# Patient Record
Sex: Female | Born: 1970 | Race: White | Hispanic: No | Marital: Single | Smoking: Current every day smoker
Health system: Southern US, Community
[De-identification: ages and names within clinical notes are randomized; demographics above are authoritative.]

## PROBLEM LIST (undated history)

## (undated) ENCOUNTER — Ambulatory Visit: Admission: EM | Source: Home / Self Care

## (undated) DIAGNOSIS — D219 Benign neoplasm of connective and other soft tissue, unspecified: Secondary | ICD-10-CM

## (undated) DIAGNOSIS — F419 Anxiety disorder, unspecified: Secondary | ICD-10-CM

## (undated) DIAGNOSIS — F329 Major depressive disorder, single episode, unspecified: Secondary | ICD-10-CM

## (undated) DIAGNOSIS — F1910 Other psychoactive substance abuse, uncomplicated: Secondary | ICD-10-CM

## (undated) DIAGNOSIS — L723 Sebaceous cyst: Secondary | ICD-10-CM

## (undated) DIAGNOSIS — F32A Depression, unspecified: Secondary | ICD-10-CM

## (undated) DIAGNOSIS — R32 Unspecified urinary incontinence: Secondary | ICD-10-CM

## (undated) DIAGNOSIS — F4310 Post-traumatic stress disorder, unspecified: Secondary | ICD-10-CM

## (undated) DIAGNOSIS — B1920 Unspecified viral hepatitis C without hepatic coma: Secondary | ICD-10-CM

## (undated) DIAGNOSIS — F1010 Alcohol abuse, uncomplicated: Secondary | ICD-10-CM

## (undated) DIAGNOSIS — T50992A Poisoning by other drugs, medicaments and biological substances, intentional self-harm, initial encounter: Secondary | ICD-10-CM

## (undated) HISTORY — DX: Unspecified urinary incontinence: R32

## (undated) HISTORY — PX: BACK SURGERY: SHX140

## (undated) HISTORY — DX: Benign neoplasm of connective and other soft tissue, unspecified: D21.9

## (undated) HISTORY — DX: Anxiety disorder, unspecified: F41.9

## (undated) HISTORY — DX: Sebaceous cyst: L72.3

## (undated) HISTORY — PX: APPENDECTOMY: SHX54

## (undated) HISTORY — PX: KNEE SURGERY: SHX244

## (undated) HISTORY — DX: Post-traumatic stress disorder, unspecified: F43.10

## (undated) HISTORY — DX: Poisoning by other drugs, medicaments and biological substances, intentional self-harm, initial encounter: T50.992A

---

## 2015-03-22 DIAGNOSIS — K529 Noninfective gastroenteritis and colitis, unspecified: Secondary | ICD-10-CM | POA: Diagnosis not present

## 2015-04-02 DIAGNOSIS — R05 Cough: Secondary | ICD-10-CM | POA: Diagnosis not present

## 2015-04-02 DIAGNOSIS — F419 Anxiety disorder, unspecified: Secondary | ICD-10-CM | POA: Diagnosis not present

## 2015-04-02 DIAGNOSIS — G894 Chronic pain syndrome: Secondary | ICD-10-CM | POA: Diagnosis not present

## 2015-04-02 DIAGNOSIS — R0602 Shortness of breath: Secondary | ICD-10-CM | POA: Diagnosis not present

## 2015-04-02 DIAGNOSIS — G43009 Migraine without aura, not intractable, without status migrainosus: Secondary | ICD-10-CM | POA: Diagnosis not present

## 2015-04-02 DIAGNOSIS — J45901 Unspecified asthma with (acute) exacerbation: Secondary | ICD-10-CM | POA: Diagnosis not present

## 2015-04-02 DIAGNOSIS — G4700 Insomnia, unspecified: Secondary | ICD-10-CM | POA: Diagnosis not present

## 2015-04-02 DIAGNOSIS — E669 Obesity, unspecified: Secondary | ICD-10-CM | POA: Diagnosis not present

## 2015-04-02 DIAGNOSIS — F17290 Nicotine dependence, other tobacco product, uncomplicated: Secondary | ICD-10-CM | POA: Diagnosis not present

## 2015-04-04 DIAGNOSIS — M5417 Radiculopathy, lumbosacral region: Secondary | ICD-10-CM | POA: Diagnosis not present

## 2015-04-04 DIAGNOSIS — M961 Postlaminectomy syndrome, not elsewhere classified: Secondary | ICD-10-CM | POA: Diagnosis not present

## 2015-04-26 DIAGNOSIS — M5417 Radiculopathy, lumbosacral region: Secondary | ICD-10-CM | POA: Diagnosis not present

## 2015-04-26 DIAGNOSIS — M961 Postlaminectomy syndrome, not elsewhere classified: Secondary | ICD-10-CM | POA: Diagnosis not present

## 2015-04-26 DIAGNOSIS — M545 Low back pain: Secondary | ICD-10-CM | POA: Diagnosis not present

## 2015-05-05 DIAGNOSIS — M47816 Spondylosis without myelopathy or radiculopathy, lumbar region: Secondary | ICD-10-CM | POA: Diagnosis not present

## 2015-05-05 DIAGNOSIS — M5416 Radiculopathy, lumbar region: Secondary | ICD-10-CM | POA: Diagnosis not present

## 2015-05-24 DIAGNOSIS — E669 Obesity, unspecified: Secondary | ICD-10-CM | POA: Diagnosis not present

## 2015-05-24 DIAGNOSIS — F419 Anxiety disorder, unspecified: Secondary | ICD-10-CM | POA: Diagnosis not present

## 2015-05-24 DIAGNOSIS — G894 Chronic pain syndrome: Secondary | ICD-10-CM | POA: Diagnosis not present

## 2015-05-24 DIAGNOSIS — G43009 Migraine without aura, not intractable, without status migrainosus: Secondary | ICD-10-CM | POA: Diagnosis not present

## 2015-05-24 DIAGNOSIS — G4700 Insomnia, unspecified: Secondary | ICD-10-CM | POA: Diagnosis not present

## 2015-06-16 DIAGNOSIS — E669 Obesity, unspecified: Secondary | ICD-10-CM | POA: Diagnosis not present

## 2015-06-16 DIAGNOSIS — G894 Chronic pain syndrome: Secondary | ICD-10-CM | POA: Diagnosis not present

## 2015-06-16 DIAGNOSIS — G450 Vertebro-basilar artery syndrome: Secondary | ICD-10-CM | POA: Diagnosis not present

## 2015-06-16 DIAGNOSIS — F419 Anxiety disorder, unspecified: Secondary | ICD-10-CM | POA: Diagnosis not present

## 2015-06-16 DIAGNOSIS — G4700 Insomnia, unspecified: Secondary | ICD-10-CM | POA: Diagnosis not present

## 2015-06-17 DIAGNOSIS — E118 Type 2 diabetes mellitus with unspecified complications: Secondary | ICD-10-CM | POA: Diagnosis not present

## 2015-06-17 DIAGNOSIS — K319 Disease of stomach and duodenum, unspecified: Secondary | ICD-10-CM | POA: Diagnosis not present

## 2015-06-17 DIAGNOSIS — D649 Anemia, unspecified: Secondary | ICD-10-CM | POA: Diagnosis not present

## 2015-06-17 DIAGNOSIS — R6889 Other general symptoms and signs: Secondary | ICD-10-CM | POA: Diagnosis not present

## 2015-07-21 DIAGNOSIS — S8390XA Sprain of unspecified site of unspecified knee, initial encounter: Secondary | ICD-10-CM | POA: Diagnosis not present

## 2015-07-21 DIAGNOSIS — M25561 Pain in right knee: Secondary | ICD-10-CM | POA: Diagnosis not present

## 2015-10-26 DIAGNOSIS — G894 Chronic pain syndrome: Secondary | ICD-10-CM | POA: Diagnosis not present

## 2015-10-26 DIAGNOSIS — M791 Myalgia: Secondary | ICD-10-CM | POA: Diagnosis not present

## 2015-10-26 DIAGNOSIS — J069 Acute upper respiratory infection, unspecified: Secondary | ICD-10-CM | POA: Diagnosis not present

## 2015-10-26 DIAGNOSIS — M545 Low back pain: Secondary | ICD-10-CM | POA: Diagnosis not present

## 2015-10-31 DIAGNOSIS — M5416 Radiculopathy, lumbar region: Secondary | ICD-10-CM | POA: Diagnosis not present

## 2015-10-31 DIAGNOSIS — M47816 Spondylosis without myelopathy or radiculopathy, lumbar region: Secondary | ICD-10-CM | POA: Diagnosis not present

## 2016-01-08 DIAGNOSIS — F419 Anxiety disorder, unspecified: Secondary | ICD-10-CM | POA: Diagnosis not present

## 2016-01-08 DIAGNOSIS — Z3200 Encounter for pregnancy test, result unknown: Secondary | ICD-10-CM | POA: Diagnosis not present

## 2016-01-11 DIAGNOSIS — R109 Unspecified abdominal pain: Secondary | ICD-10-CM | POA: Diagnosis not present

## 2016-01-11 DIAGNOSIS — N926 Irregular menstruation, unspecified: Secondary | ICD-10-CM | POA: Diagnosis not present

## 2016-01-11 DIAGNOSIS — Z01419 Encounter for gynecological examination (general) (routine) without abnormal findings: Secondary | ICD-10-CM | POA: Diagnosis not present

## 2016-01-11 DIAGNOSIS — N949 Unspecified condition associated with female genital organs and menstrual cycle: Secondary | ICD-10-CM | POA: Diagnosis not present

## 2016-01-20 DIAGNOSIS — T888XXA Other specified complications of surgical and medical care, not elsewhere classified, initial encounter: Secondary | ICD-10-CM | POA: Diagnosis not present

## 2016-01-29 DIAGNOSIS — R509 Fever, unspecified: Secondary | ICD-10-CM | POA: Diagnosis not present

## 2016-01-29 DIAGNOSIS — J069 Acute upper respiratory infection, unspecified: Secondary | ICD-10-CM | POA: Diagnosis not present

## 2016-02-14 DIAGNOSIS — M961 Postlaminectomy syndrome, not elsewhere classified: Secondary | ICD-10-CM | POA: Diagnosis not present

## 2016-02-14 DIAGNOSIS — M5417 Radiculopathy, lumbosacral region: Secondary | ICD-10-CM | POA: Diagnosis not present

## 2016-03-31 DIAGNOSIS — Z139 Encounter for screening, unspecified: Secondary | ICD-10-CM | POA: Diagnosis not present

## 2016-04-05 DIAGNOSIS — R0989 Other specified symptoms and signs involving the circulatory and respiratory systems: Secondary | ICD-10-CM | POA: Diagnosis not present

## 2016-04-05 DIAGNOSIS — M25531 Pain in right wrist: Secondary | ICD-10-CM | POA: Diagnosis not present

## 2016-04-05 DIAGNOSIS — M79641 Pain in right hand: Secondary | ICD-10-CM | POA: Diagnosis not present

## 2016-04-06 DIAGNOSIS — R0989 Other specified symptoms and signs involving the circulatory and respiratory systems: Secondary | ICD-10-CM | POA: Diagnosis not present

## 2016-04-06 DIAGNOSIS — M79641 Pain in right hand: Secondary | ICD-10-CM | POA: Diagnosis not present

## 2016-04-06 DIAGNOSIS — M25531 Pain in right wrist: Secondary | ICD-10-CM | POA: Diagnosis not present

## 2016-04-23 ENCOUNTER — Emergency Department (HOSPITAL_COMMUNITY)
Admission: EM | Admit: 2016-04-23 | Discharge: 2016-04-23 | Disposition: A | Discharge status: Home / Self Care | Payer: Self-pay | Attending: Emergency Medicine | Admitting: Emergency Medicine

## 2016-04-23 ENCOUNTER — Encounter (HOSPITAL_COMMUNITY): Payer: Self-pay | Admitting: Emergency Medicine

## 2016-04-23 DIAGNOSIS — F1110 Opioid abuse, uncomplicated: Secondary | ICD-10-CM | POA: Insufficient documentation

## 2016-04-23 DIAGNOSIS — Z5321 Procedure and treatment not carried out due to patient leaving prior to being seen by health care provider: Secondary | ICD-10-CM | POA: Insufficient documentation

## 2016-04-23 HISTORY — DX: Other psychoactive substance abuse, uncomplicated: F19.10

## 2016-04-23 NOTE — ED Notes (Signed)
No answer from pt when called for room x 1.  

## 2016-04-23 NOTE — ED Triage Notes (Signed)
Per EMS: Pt moved here from [place] two days ago.  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  Pt last used heroin 2 days ago.  Called different facilities this morning but they could not accept her because of her insurance.  Pt has had abd pain, NVD x 2 days.

## 2016-04-23 NOTE — ED Notes (Signed)
Pt called x3 with no response  

## 2016-04-23 NOTE — ED Notes (Signed)
2nd call for pt with no response. 

## 2016-04-24 NOTE — ED Provider Notes (Signed)
I did not see or evaluate the patient.  The patient left the emergency department after triage and before being seen by medical provider   Jola Schmidt, MD 04/24/16 [phone]

## 2016-05-01 DIAGNOSIS — M5136 Other intervertebral disc degeneration, lumbar region: Secondary | ICD-10-CM | POA: Diagnosis not present

## 2016-06-11 ENCOUNTER — Encounter (HOSPITAL_COMMUNITY): Payer: Self-pay | Admitting: Emergency Medicine

## 2016-06-11 ENCOUNTER — Emergency Department (HOSPITAL_COMMUNITY)
Admission: EM | Admit: 2016-06-11 | Discharge: 2016-06-11 | Disposition: A | Discharge status: Home / Self Care | Payer: Self-pay | Attending: Dermatology | Admitting: Dermatology

## 2016-06-11 DIAGNOSIS — F10120 Alcohol abuse with intoxication, uncomplicated: Secondary | ICD-10-CM | POA: Insufficient documentation

## 2016-06-11 DIAGNOSIS — Z5321 Procedure and treatment not carried out due to patient leaving prior to being seen by health care provider: Secondary | ICD-10-CM | POA: Insufficient documentation

## 2016-06-11 DIAGNOSIS — R000 Tachycardia, unspecified: Secondary | ICD-10-CM | POA: Diagnosis not present

## 2016-06-11 NOTE — ED Notes (Signed)
Pt not in room.
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
## 2016-06-21 ENCOUNTER — Ambulatory Visit (HOSPITAL_COMMUNITY)
Admission: RE | Admit: 2016-06-21 | Discharge: 2016-06-21 | Disposition: A | Discharge status: Home / Self Care | Payer: Medicare Other | Attending: Psychiatry | Admitting: Psychiatry

## 2016-06-21 DIAGNOSIS — F1120 Opioid dependence, uncomplicated: Secondary | ICD-10-CM | POA: Insufficient documentation

## 2016-06-21 DIAGNOSIS — Z720 Tobacco use: Secondary | ICD-10-CM | POA: Insufficient documentation

## 2016-06-21 DIAGNOSIS — F1020 Alcohol dependence, uncomplicated: Secondary | ICD-10-CM | POA: Diagnosis present
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
## 2016-06-21 NOTE — H&P (Signed)
Behavioral Health Medical Screening Exam  Laura [name] is an 46 y.o. female.  Total Time spent with patient: 20 minutes  Psychiatric Specialty Exam: Physical Exam  Constitutional: She is oriented to person, place, and time. She appears well-developed and well-nourished.  HENT:  Head: Normocephalic.  Right Ear: External ear normal.  Left Ear: External ear normal.  Neck: Normal range of motion.  Cardiovascular: Regular rhythm and normal heart sounds.   Respiratory: Effort normal and breath sounds normal.  GI: Soft. Bowel sounds are normal.  Musculoskeletal: Normal range of motion.  Neurological: She is alert and oriented to person, place, and time.  Skin: Skin is warm and dry.    Review of Systems  Psychiatric/Behavioral: Positive for substance abuse. Negative for depression, hallucinations, memory loss and suicidal ideas. The patient is not nervous/anxious and does not have insomnia.     Blood pressure (!) 135/100, pulse (!) 120, temperature 98.8 F (37.1 C), temperature source Oral, resp. rate 20, SpO2 97 %.There is no height or weight on file to calculate BMI.  General Appearance: Disheveled  Eye Contact:  Fair  Speech:  Clear and Coherent and Normal Rate  Volume:  Normal  Mood:  Anxious  Affect:  Congruent  Thought Process:  Coherent and Linear  Orientation:  Full (Time, Place, and Person)  Thought Content:  Logical  Suicidal Thoughts:  No  Homicidal Thoughts:  No  Memory:  Immediate;   Good Recent;   Good Remote;   Fair  Judgement:  Fair  Insight:  Fair  Psychomotor Activity:  Normal  Concentration: Concentration: Fair and Attention Span: Fair  Recall:  Good  Fund of Knowledge:Good  Language: Good  Akathisia:  No  Handed:  Right  AIMS (if indicated):     Assets:  Communication Skills Desire for Improvement Financial Resources/Insurance Housing Resilience Social Support Transportation  Sleep:       Musculoskeletal: Strength & Muscle Tone: within normal  limits Gait & Station: normal Patient leans: N/A  Blood pressure (!) 135/100, pulse (!) 120, temperature 98.8 F (37.1 C), temperature source Oral, resp. rate 20, SpO2 97 %.  Recommendations:  Based on my evaluation the patient does not appear to have an emergency medical condition.  Ethelene Hal, NP 06/21/2016, 5:03 PM

## 2016-06-26 DIAGNOSIS — M792 Neuralgia and neuritis, unspecified: Secondary | ICD-10-CM | POA: Diagnosis not present

## 2016-06-28 ENCOUNTER — Telehealth: Payer: Self-pay | Admitting: *Deleted

## 2016-06-28 NOTE — Telephone Encounter (Signed)
PreVisit Call attempted. Pt unavailable.

## 2016-06-29 ENCOUNTER — Encounter: Payer: Self-pay | Admitting: Family Medicine

## 2016-06-29 ENCOUNTER — Ambulatory Visit (INDEPENDENT_AMBULATORY_CARE_PROVIDER_SITE_OTHER): Payer: Medicare Other | Admitting: Family Medicine

## 2016-06-29 VITALS — BP 126/80 | HR 87 | Temp 98.1°F | Ht 68.0 in | Wt 161.0 lb

## 2016-06-29 DIAGNOSIS — F411 Generalized anxiety disorder: Secondary | ICD-10-CM | POA: Diagnosis not present

## 2016-06-29 DIAGNOSIS — S61512A Laceration without foreign body of left wrist, initial encounter: Secondary | ICD-10-CM | POA: Diagnosis not present

## 2016-06-29 DIAGNOSIS — L729 Follicular cyst of the skin and subcutaneous tissue, unspecified: Secondary | ICD-10-CM | POA: Diagnosis not present

## 2016-06-29 DIAGNOSIS — M79602 Pain in left arm: Secondary | ICD-10-CM | POA: Diagnosis not present

## 2016-06-29 DIAGNOSIS — Z23 Encounter for immunization: Secondary | ICD-10-CM | POA: Diagnosis not present

## 2016-06-29 MED ORDER — DULOXETINE HCL 20 MG PO CPEP: 20.0000 mg | ORAL_CAPSULE | Freq: Every day | ORAL | 1 refills | Status: DC

## 2016-06-29 MED ORDER — MELOXICAM 15 MG PO TABS: 15.0000 mg | ORAL_TABLET | Freq: Every day | ORAL | 1 refills | Status: DC

## 2016-06-29 MED ORDER — HYDROXYZINE HCL 25 MG PO TABS: 25.0000 mg | ORAL_TABLET | Freq: Two times a day (BID) | ORAL | 0 refills | Status: DC | PRN

## 2016-06-29 NOTE — Patient Instructions (Addendum)
I have sent in two medications for anxiety- one for daily use and one for as needed use. The vistaril may make you sleepy.  I have sent in a prescription for meloxicam which is a prescription strength anti-inflammatory- do not take additional anti-inflammatory medicine such as Motrin, ibuprofen or Naproxen while taking.  I want you to take this daily for 5-7 days then as needed.  Please make an appointment to see Dr. Paulla Fore (here in our office) for your arm pain Please follow up with me in 4 weeks, sooner if you start to feel worse.   Generalized Anxiety Disorder, Adult Generalized anxiety disorder (GAD) is a mental health disorder. People with this condition constantly worry about everyday events. Unlike normal anxiety, worry related to GAD is not triggered by a specific event. These worries also do not fade or get better with time. GAD interferes with life functions, including relationships, work, and school. GAD can vary from mild to severe. People with severe GAD can have intense waves of anxiety with physical symptoms (panic attacks). What are the causes? The exact cause of GAD is not known. What increases the risk? This condition is more likely to develop in:  Women.  People who have a family history of anxiety disorders.  People who are very shy.  People who experience very stressful life events, such as the death of a loved one.  People who have a very stressful family environment. What are the signs or symptoms? People with GAD often worry excessively about many things in their lives, such as their health and family. They may also be overly concerned about:  Doing well at work.  Being on time.  Natural disasters.  Friendships. Physical symptoms of GAD include:  Fatigue.  Muscle tension or having muscle twitches.  Trembling or feeling shaky.  Being easily startled.  Feeling like your heart is pounding or racing.  Feeling out of breath or like you cannot take a  deep breath.  Having trouble falling asleep or staying asleep.  Sweating.  Nausea, diarrhea, or irritable bowel syndrome (IBS).  Headaches.  Trouble concentrating or remembering facts.  Restlessness.  Irritability. How is this diagnosed? Your health care provider can diagnose GAD based on your symptoms and medical history. You will also have a physical exam. The health care provider will ask specific questions about your symptoms, including how severe they are, when they started, and if they come and go. Your health care provider may ask you about your use of alcohol or drugs, including prescription medicines. Your health care provider may refer you to a mental health specialist for further evaluation. Your health care provider will do a thorough examination and may perform additional tests to rule out other possible causes of your symptoms. To be diagnosed with GAD, a person must have anxiety that:  Is out of his or her control.  Affects several different aspects of his or her life, such as work and relationships.  Causes distress that makes him or her unable to take part in normal activities.  Includes at least three physical symptoms of GAD, such as restlessness, fatigue, trouble concentrating, irritability, muscle tension, or sleep problems. Before your health care provider can confirm a diagnosis of GAD, these symptoms must be present more days than they are not, and they must last for six months or longer. How is this treated? The following therapies are usually used to treat GAD:  Medicine. Antidepressant medicine is usually prescribed for long-term daily control. Antianxiety medicines  may be added in severe cases, especially when panic attacks occur.  Talk therapy (psychotherapy). Certain types of talk therapy can be helpful in treating GAD by providing support, education, and guidance. Options include:  Cognitive behavioral therapy (CBT). People learn coping skills and  techniques to ease their anxiety. They learn to identify unrealistic or negative thoughts and behaviors and to replace them with positive ones.  Acceptance and commitment therapy (ACT). This treatment teaches people how to be mindful as a way to cope with unwanted thoughts and feelings.  Biofeedback. This process trains you to manage your body's response (physiological response) through breathing techniques and relaxation methods. You will work with a therapist while machines are used to monitor your physical symptoms.  Stress management techniques. These include yoga, meditation, and exercise. A mental health specialist can help determine which treatment is best for you. Some people see improvement with one type of therapy. However, other people require a combination of therapies. Follow these instructions at home:  Take over-the-counter and prescription medicines only as told by your health care provider.  Try to maintain a normal routine.  Try to anticipate stressful situations and allow extra time to manage them.  Practice any stress management or self-calming techniques as taught by your health care provider.  Do not punish yourself for setbacks or for not making progress.  Try to recognize your accomplishments, even if they are small.  Keep all follow-up visits as told by your health care provider. This is important. Contact a health care provider if:  Your symptoms do not get better.  Your symptoms get worse.  You have signs of depression, such as:  A persistently sad, cranky, or irritable mood.  Loss of enjoyment in activities that used to bring you joy.  Change in weight or eating.  Changes in sleeping habits.  Avoiding friends or family members.  Loss of energy for normal tasks.  Feelings of guilt or worthlessness. Get help right away if:  You have serious thoughts about hurting yourself or others. If you ever feel like you may hurt yourself or others, or have  thoughts about taking your own life, get help right away. You can go to your nearest emergency department or call:  Your local emergency services (911 in the [place]  A suicide crisis helpline, such as the [facility] at 604-171-3716. This is open 24 hours a day. Summary  Generalized anxiety disorder (GAD) is a mental health disorder that involves worry that is not triggered by a specific event.  People with GAD often worry excessively about many things in their lives, such as their health and family.  GAD may cause physical symptoms such as restlessness, trouble concentrating, sleep problems, frequent sweating, nausea, diarrhea, headaches, and trembling or muscle twitching.  A mental health specialist can help determine which treatment is best for you. Some people see improvement with one type of therapy. However, other people require a combination of therapies. This information is not intended to replace advice given to you by your health care provider. Make sure you discuss any questions you have with your health care provider. Document Released: 06/30/2012 Document Revised: 01/24/2016 Document Reviewed: 01/24/2016 Elsevier Interactive Patient Education  2017 [employer].
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
## 2016-06-29 NOTE — Progress Notes (Signed)
Pre visit review using our clinic review tool, if applicable. No additional management support is needed unless otherwise documented below in the visit note. 

## 2016-07-02 ENCOUNTER — Other Ambulatory Visit: Payer: Self-pay | Admitting: Family Medicine

## 2016-07-02 ENCOUNTER — Telehealth: Payer: Self-pay | Admitting: Family Medicine

## 2016-07-02 ENCOUNTER — Ambulatory Visit (INDEPENDENT_AMBULATORY_CARE_PROVIDER_SITE_OTHER): Payer: Medicare Other | Admitting: Sports Medicine

## 2016-07-02 ENCOUNTER — Ambulatory Visit (INDEPENDENT_AMBULATORY_CARE_PROVIDER_SITE_OTHER): Payer: Medicare Other

## 2016-07-02 ENCOUNTER — Encounter: Payer: Self-pay | Admitting: Sports Medicine

## 2016-07-02 VITALS — BP 120/82 | HR 103 | Ht 68.5 in | Wt 158.2 lb

## 2016-07-02 DIAGNOSIS — F411 Generalized anxiety disorder: Secondary | ICD-10-CM

## 2016-07-02 DIAGNOSIS — M79602 Pain in left arm: Secondary | ICD-10-CM | POA: Diagnosis not present

## 2016-07-02 DIAGNOSIS — L729 Follicular cyst of the skin and subcutaneous tissue, unspecified: Secondary | ICD-10-CM | POA: Diagnosis not present

## 2016-07-02 DIAGNOSIS — M47816 Spondylosis without myelopathy or radiculopathy, lumbar region: Secondary | ICD-10-CM | POA: Diagnosis not present

## 2016-07-02 DIAGNOSIS — F1910 Other psychoactive substance abuse, uncomplicated: Secondary | ICD-10-CM

## 2016-07-02 DIAGNOSIS — M542 Cervicalgia: Secondary | ICD-10-CM | POA: Diagnosis not present

## 2016-07-02 DIAGNOSIS — M4722 Other spondylosis with radiculopathy, cervical region: Secondary | ICD-10-CM | POA: Diagnosis not present

## 2016-07-02 DIAGNOSIS — M47812 Spondylosis without myelopathy or radiculopathy, cervical region: Secondary | ICD-10-CM | POA: Diagnosis not present

## 2016-07-02 LAB — COMPREHENSIVE METABOLIC PANEL
ALK PHOS: 71 U/L (ref 39–117)
ALT: 90 U/L — ABNORMAL HIGH (ref 0–35)
AST: 72 U/L — ABNORMAL HIGH (ref 0–37)
Albumin: 4.2 g/dL (ref 3.5–5.2)
BUN: 11 mg/dL (ref 6–23)
CO2: 25 mEq/L (ref 19–32)
Calcium: 9.5 mg/dL (ref 8.4–10.5)
Chloride: 103 mEq/L (ref 96–112)
Creatinine, Ser: 0.77 mg/dL (ref 0.40–1.20)
GFR: 86.7 mL/min (ref >60.00)
Glucose, Bld: 131 mg/dL — ABNORMAL HIGH (ref 70–99)
POTASSIUM: 3.3 meq/L — AB (ref 3.5–5.1)
SODIUM: 137 meq/L (ref 135–145)
TOTAL PROTEIN: 7.4 g/dL (ref 6.0–8.3)
Total Bilirubin: 0.7 mg/dL (ref 0.2–1.2)

## 2016-07-02 LAB — TSH: TSH: 1.9 u[IU]/mL (ref 0.35–4.50)

## 2016-07-02 MED ORDER — METHYLPREDNISOLONE 4 MG PO TBPK: ORAL_TABLET | ORAL | 0 refills | Status: DC

## 2016-07-02 MED ORDER — METHYLPREDNISOLONE ACETATE 40 MG/ML IJ SUSP
40.0000 mg | Freq: Once | INTRAMUSCULAR | Status: AC
  Administered 2016-07-02: 40 mg via INTRAMUSCULAR

## 2016-07-02 MED ORDER — KETOROLAC TROMETHAMINE 60 MG/2ML IM SOLN
60.0000 mg | Freq: Once | INTRAMUSCULAR | Status: AC
  Administered 2016-07-02: 60 mg via INTRAMUSCULAR

## 2016-07-02 MED ORDER — GABAPENTIN 300 MG PO CAPS: ORAL_CAPSULE | ORAL | 1 refills | Status: DC

## 2016-07-02 NOTE — Telephone Encounter (Signed)
Patient called and requested that the seroquel be filled due to [facility] her other medication that Dr. Paulla Fore prescribed today.   I advised patient that I spoke with Clarene Reamer, NP who advised me that she can not have both medication together and that she will call patient today to discuss further. The medication that Dr. Paulla Fore prescribed will help patient sleep per Clarene Reamer.  Patient verbally expressed understanding.

## 2016-07-02 NOTE — Telephone Encounter (Signed)
Spoke with patient following her appointment with Dr. Paulla Fore. She was not able to afford to get the hydroxyzine filled, would like to go back on her seroquel. Will restart her on Seroquel at 50 mg qhs, working up to 200 mg qhs with follow up.

## 2016-07-02 NOTE — Telephone Encounter (Signed)
Patient needs script changed back to Seroquel 300mg  by mouth daily instead of the Vistaril.   She is here to see Dr. Paulla Fore today.  Thank you,  -LL

## 2016-07-02 NOTE — Assessment & Plan Note (Signed)
8 prior back surgeries.  Underlying degenerative change no known inflammatory arthropathy.
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
## 2016-07-02 NOTE — Assessment & Plan Note (Signed)
Avoid Opioids, close monitoring of gabapentin

## 2016-07-02 NOTE — Telephone Encounter (Signed)
Noted  

## 2016-07-02 NOTE — Assessment & Plan Note (Addendum)
Gabapentin, medrol dose pack. Medrol/Toradol injection today. MRI due to worsening chronic symptoms over the past 2 weeks.  Has had prior neck pain for 2+ years with acute injury that is progressive and now generalized weakness in left arm and decreased grip strength.

## 2016-07-02 NOTE — Telephone Encounter (Signed)
Called and spoke with patient and let her know that she could not take Seroquel with gabapentin, that the gabapentin prescribed today by Dr. Paulla Fore would help with her pain and ability to sleep. Encouraged her to continue Duloxetine for pain, perimenopausal symptoms and anxiety as well.

## 2016-07-04 ENCOUNTER — Telehealth: Payer: Self-pay | Admitting: Family Medicine

## 2016-07-04 NOTE — Telephone Encounter (Signed)
Patient returned Sierra's call. °

## 2016-07-06 NOTE — Telephone Encounter (Signed)
Spoke with patient.

## 2016-07-09 ENCOUNTER — Telehealth: Payer: Self-pay | Admitting: Sports Medicine

## 2016-07-09 NOTE — Telephone Encounter (Signed)
Called [phone] and [facility] for pt to call the office.

## 2016-07-09 NOTE — Telephone Encounter (Signed)
Patient returning call. Please call back

## 2016-07-09 NOTE — Telephone Encounter (Signed)
Please call her and tell her that since she had already had two courses of prednisone, it is not likely a third will help. She can resume meloxicam, alternate with acetaminophen, use heat. I see she has MRI scheduled in a couple of days, we will know more after the results are in.

## 2016-07-09 NOTE — Telephone Encounter (Signed)
Forwarding request to Clarene Reamer (PCP) as Dr. Paulla Fore is out of the office this week.

## 2016-07-09 NOTE — Telephone Encounter (Signed)
**  Remind patient they can make refill requests via MyChart**  Medication refill request (Name & Dosage): methylPREDNISolone (MEDROL DOSEPAK) 4 MG TBPK tablet [[id number]]     [phone] [address], [place] [phone] (Phone) [phone] (Fax)       Other comments (if applicable):   Patient stated she is in a lot of pain, and cannot use her left arm. Would like a call back if available.

## 2016-07-10 NOTE — Telephone Encounter (Signed)
Forwarding to [place] as I am out of the office today.

## 2016-07-10 NOTE — Telephone Encounter (Signed)
Called and left voicemail for pt to return call to office.  

## 2016-07-11 ENCOUNTER — Other Ambulatory Visit: Payer: Self-pay | Admitting: Family Medicine

## 2016-07-11 ENCOUNTER — Telehealth: Payer: Self-pay

## 2016-07-11 DIAGNOSIS — F411 Generalized anxiety disorder: Secondary | ICD-10-CM

## 2016-07-11 MED ORDER — QUETIAPINE FUMARATE 50 MG PO TABS: ORAL_TABLET | ORAL | 1 refills | Status: DC

## 2016-07-11 NOTE — Telephone Encounter (Signed)
Pt said she has been off seroquel for 3 years; pt is almost out of gabapentin (per med list pt has refills on gabapentin) pt said could not take seroquel and gabapentin at same time but pt has only slept about 10 hrs in last 2 weeks;pt said she cannot go to sleep at night. Pt request cb. Last saw Glenda Chroman FNP acute on 07/02/16 and established care [facility]/16[facility] [address]

## 2016-07-11 NOTE — Telephone Encounter (Signed)
Called and spoke with pt informing her of Deborah's recommendations. 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 I informed her that per Jackelyn Poling she should take the Meloxicam. Patient states that she doesn't have any left. I called and left voicemail on pt's voicemail informing her that she has a prescription for 30 days and one refill.

## 2016-07-11 NOTE — Telephone Encounter (Signed)
Patient called several times, no answer. Seroquel 50 mg po sent to pharmacy. 50 mg qhs x 2 days then increase to 100 mg qhs. Lower dose than she was previously on due to addition of gabapentin.

## 2016-07-12 ENCOUNTER — Inpatient Hospital Stay: Admission: RE | Admit: 2016-07-12 | Payer: Self-pay | Source: Ambulatory Visit

## 2016-07-20 ENCOUNTER — Encounter (HOSPITAL_COMMUNITY): Payer: Self-pay | Admitting: *Deleted

## 2016-07-20 ENCOUNTER — Other Ambulatory Visit: Payer: Self-pay

## 2016-07-20 ENCOUNTER — Emergency Department (HOSPITAL_COMMUNITY)
Admission: EM | Admit: 2016-07-20 | Discharge: 2016-07-21 | Disposition: A | Discharge status: Other Acute Inpatient Hospital | Payer: Medicare Other | Attending: Emergency Medicine | Admitting: Emergency Medicine

## 2016-07-20 DIAGNOSIS — R55 Syncope and collapse: Secondary | ICD-10-CM | POA: Insufficient documentation

## 2016-07-20 DIAGNOSIS — Z79899 Other long term (current) drug therapy: Secondary | ICD-10-CM | POA: Diagnosis not present

## 2016-07-20 DIAGNOSIS — R251 Tremor, unspecified: Secondary | ICD-10-CM | POA: Diagnosis not present

## 2016-07-20 DIAGNOSIS — F17200 Nicotine dependence, unspecified, uncomplicated: Secondary | ICD-10-CM | POA: Diagnosis not present

## 2016-07-20 DIAGNOSIS — M542 Cervicalgia: Secondary | ICD-10-CM | POA: Diagnosis not present

## 2016-07-20 DIAGNOSIS — R45851 Suicidal ideations: Secondary | ICD-10-CM | POA: Diagnosis not present

## 2016-07-20 LAB — CBC WITH DIFFERENTIAL/PLATELET
BASOS ABS: 0 10*3/uL (ref 0.0–0.1)
Basophils Relative: 1 %
EOS PCT: 2 %
Eosinophils Absolute: 0.1 10*3/uL (ref 0.0–0.7)
HCT: 40.8 % (ref 36.0–46.0)
Hemoglobin: 13.8 g/dL (ref 12.0–15.0)
Lymphocytes Relative: 40 %
Lymphs Abs: 2.6 10*3/uL (ref 0.7–4.0)
MCH: 33.3 pg (ref 26.0–34.0)
MCHC: 33.8 g/dL (ref 30.0–36.0)
MCV: 98.6 fL (ref 78.0–100.0)
MONO ABS: 0.3 10*3/uL (ref 0.1–1.0)
Monocytes Relative: 5 %
Neutro Abs: 3.4 10*3/uL (ref 1.7–7.7)
Neutrophils Relative %: 52 %
PLATELETS: 165 10*3/uL (ref 150–400)
RBC: 4.14 MIL/uL (ref 3.87–5.11)
RDW: 14.1 % (ref 11.5–15.5)
WBC: 6.4 10*3/uL (ref 4.0–10.5)

## 2016-07-20 LAB — I-STAT CHEM 8, ED
BUN: 10 mg/dL (ref 6–20)
CHLORIDE: 108 mmol/L (ref 101–111)
Calcium, Ion: 1.01 mmol/L — ABNORMAL LOW (ref 1.15–1.40)
Creatinine, Ser: 1 mg/dL (ref 0.44–1.00)
Glucose, Bld: 171 mg/dL — ABNORMAL HIGH (ref 65–99)
HCT: 44 % (ref 36.0–46.0)
Hemoglobin: 15 g/dL (ref 12.0–15.0)
Potassium: 3.2 mmol/L — ABNORMAL LOW (ref 3.5–5.1)
SODIUM: 145 mmol/L (ref 135–145)
TCO2: 24 mmol/L (ref 0–100)

## 2016-07-20 LAB — COMPREHENSIVE METABOLIC PANEL
ALT: 40 U/L (ref 14–54)
AST: 48 U/L — AB (ref 15–41)
Albumin: 4.2 g/dL (ref 3.5–5.0)
Alkaline Phosphatase: 121 U/L (ref 38–126)
Anion gap: 15 (ref 5–15)
BILIRUBIN TOTAL: 0.9 mg/dL (ref 0.3–1.2)
BUN: 9 mg/dL (ref 6–20)
CO2: 22 mmol/L (ref 22–32)
CREATININE: 0.69 mg/dL (ref 0.44–1.00)
Calcium: 8.9 mg/dL (ref 8.9–10.3)
Chloride: 106 mmol/L (ref 101–111)
GFR calc Af Amer: >60 mL/min (ref >60)
Glucose, Bld: 169 mg/dL — ABNORMAL HIGH (ref 65–99)
POTASSIUM: 3.3 mmol/L — AB (ref 3.5–5.1)
Sodium: 143 mmol/L (ref 135–145)
TOTAL PROTEIN: 7.6 g/dL (ref 6.5–8.1)

## 2016-07-20 LAB — RAPID URINE DRUG SCREEN, HOSP PERFORMED
Amphetamines: NOT DETECTED
BENZODIAZEPINES: NOT DETECTED
Barbiturates: NOT DETECTED
COCAINE: NOT DETECTED
Opiates: NOT DETECTED
Tetrahydrocannabinol: NOT DETECTED

## 2016-07-20 LAB — ACETAMINOPHEN LEVEL

## 2016-07-20 LAB — SALICYLATE LEVEL: Salicylate Lvl: <7 mg/dL (ref 2.8–30.0)

## 2016-07-20 LAB — ETHANOL: ALCOHOL ETHYL (B): 318 mg/dL — AB (ref <5)

## 2016-07-20 MED ORDER — LORAZEPAM 1 MG PO TABS
1.0000 mg | ORAL_TABLET | Freq: Four times a day (QID) | ORAL | Status: DC | PRN
  Administered 2016-07-20 (×2): 1 mg via ORAL
  Filled 2016-07-20 (×2): qty 1

## 2016-07-20 MED ORDER — THIAMINE HCL 100 MG/ML IJ SOLN: 100.0000 mg | Freq: Every day | INTRAMUSCULAR | Status: DC

## 2016-07-20 MED ORDER — VITAMIN B-1 100 MG PO TABS
100.0000 mg | ORAL_TABLET | Freq: Every day | ORAL | Status: DC
  Administered 2016-07-20: 100 mg via ORAL
  Filled 2016-07-20: qty 1

## 2016-07-20 MED ORDER — POTASSIUM CHLORIDE CRYS ER 20 MEQ PO TBCR
40.0000 meq | EXTENDED_RELEASE_TABLET | Freq: Once | ORAL | Status: AC
  Administered 2016-07-20: 40 meq via ORAL
  Filled 2016-07-20: qty 2

## 2016-07-20 MED ORDER — NICOTINE 21 MG/24HR TD PT24
21.0000 mg | MEDICATED_PATCH | Freq: Once | TRANSDERMAL | Status: DC
  Administered 2016-07-20: 21 mg via TRANSDERMAL
  Filled 2016-07-20: qty 1

## 2016-07-20 MED ORDER — FOLIC ACID 1 MG PO TABS
1.0000 mg | ORAL_TABLET | Freq: Every day | ORAL | Status: DC
  Administered 2016-07-20: 1 mg via ORAL
  Filled 2016-07-20: qty 1

## 2016-07-20 MED ORDER — ADULT MULTIVITAMIN W/MINERALS CH
1.0000 | ORAL_TABLET | Freq: Every day | ORAL | Status: DC
  Administered 2016-07-20: 1 via ORAL
  Filled 2016-07-20: qty 1

## 2016-07-20 MED ORDER — LORAZEPAM 2 MG/ML IJ SOLN: 1.0000 mg | Freq: Four times a day (QID) | INTRAMUSCULAR | Status: DC | PRN

## 2016-07-20 NOTE — ED Provider Notes (Signed)
[place] DEPT Provider Note   CSN: [id number] Arrival date & time: 07/20/16  1401     History   Chief Complaint Chief Complaint  Patient presents with  . Medical Clearance  . Near Syncope    HPI Laura [name] is a 46 y.o. female.  Patient with a past medical history of anxiety and substance abuse presents with suicidal ideation without plan. She recently moved from [place] to [place] to be with her boyfriend. She states that since then her boyfriend broke up with her which has caused her a lot of emotional stress. She was in [place] she went through multiple back surgeries and is continuing to have neck pain. She states she is drinking 4 pints of vodka per day to "numb the pain" from both the emotional distress and neck pain. She states that she was seen by her PCP here and was told she had a pinched nerve in her neck and would need MRI and possibly surgery. She states that she is here today because she realizes that she needs help. Past medical history includes being on Seroquel which her son to the way from her due to the drinking. She reports a tremor that occurs when she stops drinking. States that her last drink was approximately 8 hours ago. Denies chest pain, trouble breathing, recent injury, loss of sensation, weakness, headache, blurry vision. She reports some nausea and diarrhea but no vomiting. Denies abdominal pain.      Past Medical History:  Diagnosis Date  . Anxiety   . Substance abuse     Patient Active Problem List   Diagnosis Date Noted  . Osteoarthritis of spine with radiculopathy, cervical region 07/02/2016  . Substance abuse 07/02/2016  . Lumbar spondylosis 07/02/2016    Past Surgical History:  Procedure Laterality Date  . BACK SURGERY    . KNEE SURGERY      OB History    No data available       Home Medications    Prior to Admission medications   Medication Sig Start Date End Date Taking? Authorizing Provider  DULoxetine  (CYMBALTA) 20 MG capsule Take 1 capsule (20 mg total) by mouth daily. 06/29/16  Yes Elby Beck, FNP  gabapentin (NEURONTIN) 300 MG capsule Start with 1 tab po qhs X 1 week, then increase to 1 tab po bid X 1 week then 1 tab po tid prn 07/02/16  Yes Gerda Diss, DO  QUEtiapine (SEROQUEL) 50 MG tablet Take 1 tablet at bedtime for 2 days, then increase to 2 tablets at bedtime 07/11/16  Yes Elby Beck, FNP  hydrOXYzine (ATARAX/VISTARIL) 25 MG tablet Take 1 tablet (25 mg total) by mouth every 12 (twelve) hours as needed for anxiety. 06/29/16   Elby Beck, FNP  meloxicam (MOBIC) 15 MG tablet Take 1 tablet (15 mg total) by mouth daily. 06/29/16   Elby Beck, FNP  methylPREDNISolone (MEDROL DOSEPAK) 4 MG TBPK tablet Take by mouth as directed. Take 6 tablets on the first day prescribed then as directed. Patient not taking: Reported on 07/20/2016 07/02/16   Gerda Diss, DO    Family History Family History  Problem Relation Age of Onset  . Family history unknown: Yes    Social History Social History  Substance Use Topics  . Smoking status: Current Every Day Smoker  . Smokeless tobacco: Never Used  . Alcohol use Yes     Comment: occ     Allergies   Patient  has no known allergies.   Review of Systems Review of Systems  Constitutional: Negative for appetite change, chills and fever.  HENT: Negative for ear pain, rhinorrhea, sneezing and sore throat.   Eyes: Negative for photophobia and visual disturbance.  Respiratory: Negative for cough, chest tightness, shortness of breath and wheezing.   Cardiovascular: Negative for chest pain and palpitations.  Gastrointestinal: Positive for diarrhea. Negative for abdominal pain, blood in stool, constipation, nausea and vomiting.  Genitourinary: Negative for decreased urine volume, dysuria, hematuria and urgency.  Musculoskeletal: Positive for neck pain. Negative for myalgias.  Skin: Negative for rash.  Neurological: Positive  for tremors. Negative for dizziness, weakness, light-headedness, numbness and headaches.     Physical Exam Updated Vital Signs BP 119/83 (BP Location: Left Arm)   Pulse (!) 112   Temp 98.3 F (36.8 C) (Oral)   Resp 18   SpO2 98%   Physical Exam  Constitutional: She appears well-developed and well-nourished. No distress.  Patient is tearful. She has a mild tremor of the right arm.  HENT:  Head: Normocephalic and atraumatic.  Nose: Nose normal.  Eyes: Conjunctivae and EOM are normal. Left eye exhibits no discharge. No scleral icterus.  Neck: Normal range of motion. Neck supple.  Cardiovascular: Normal rate, regular rhythm, normal heart sounds and intact distal pulses.  Exam reveals no gallop and no friction rub.   No murmur heard. Pulmonary/Chest: Effort normal and breath sounds normal. No respiratory distress.  Abdominal: Soft. Bowel sounds are normal. She exhibits no distension. There is no tenderness. There is no guarding.  Musculoskeletal: Normal range of motion. She exhibits tenderness (Midline C-spine tenderness.). She exhibits no edema.  Patient has C-spine tenderness. No step-off noted. Normal and full active and passive range of motion of the neck.  Neurological: She is alert. She exhibits normal muscle tone. Coordination normal.  Skin: Skin is warm and dry. No rash noted.  Psychiatric: She has a normal mood and affect.  Nursing note and vitals reviewed.    ED Treatments / Results  Labs (all labs ordered are listed, but only abnormal results are displayed) Labs Reviewed  COMPREHENSIVE METABOLIC PANEL - Abnormal; Notable for the following:       Result Value   Potassium 3.3 (*)    Glucose, Bld 169 (*)    AST 48 (*)    All other components within normal limits  ETHANOL - Abnormal; Notable for the following:    Alcohol, Ethyl (B) 318 (*)    All other components within normal limits  ACETAMINOPHEN LEVEL - Abnormal; Notable for the following:    Acetaminophen  (Tylenol), Serum <10 (*)    All other components within normal limits  I-STAT CHEM 8, ED - Abnormal; Notable for the following:    Potassium 3.2 (*)    Glucose, Bld 171 (*)    Calcium, Ion 1.01 (*)    All other components within normal limits  CBC WITH DIFFERENTIAL/PLATELET  RAPID URINE DRUG SCREEN, HOSP PERFORMED  SALICYLATE LEVEL    EKG  EKG Interpretation None       Radiology No results found.  Procedures Procedures (including critical care time)  Medications Ordered in ED Medications  LORazepam (ATIVAN) tablet 1 mg (1 mg Oral Given 07/20/16 2036)    Or  LORazepam (ATIVAN) injection 1 mg ( Intravenous See Alternative 07/20/16 2036)  thiamine (VITAMIN B-1) tablet 100 mg (not administered)    Or  thiamine (B-1) injection 100 mg (not administered)  folic acid (FOLVITE) tablet  1 mg (not administered)  multivitamin with minerals tablet 1 tablet (not administered)     Initial Impression / Assessment and Plan / ED Course  I have reviewed the triage vital signs and the nursing notes.  Pertinent labs & imaging results that were available during my care of the patient were reviewed by me and considered in my medical decision making (see chart for details).     Patient's history and symptoms concerning for alcohol withdrawal versus suicidal ideation versus psychosis versus electrolyte abnormality. Patient reported suicidal ideations but no plan at this time. She denies any auditory or visual hallucinations. 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 She states that she wants to be admitted so that she can get the help that she needs. There is no acute findings or instability of her C-spine this time.  CIWA score was 7 at this time. Will be monitored for alcohol withdrawals with CIWA protocol initiated. She will be evaluated by TTS for further treatment if necessary. Labs returned as normal with no signs of infection,   Electrolyte abnormality. UDS is negative at this time. EtOH level 318 at this time. Patient appears to be medically cleared to be evaluated by psychiatry at this time.   Final Clinical Impressions(s) / ED Diagnoses   Final diagnoses:  Suicidal ideation    New Prescriptions New Prescriptions   No medications on file     Delia Heady, Hershal Coria 07/20/16 2059    Fatima Blank, MD 07/20/16 2214

## 2016-07-20 NOTE — ED Notes (Signed)
Patient eating Dinner at this time.

## 2016-07-20 NOTE — ED Notes (Signed)
Patient keeps asking when will she be evaluated from [facility].

## 2016-07-20 NOTE — ED Triage Notes (Signed)
Brought to ED to for near syncope. Pt states she has been out her seraquil for 4 days due to her son taking them away because she was drinking. Pt states she recently moved to [facility] from [place]. States she has chronic neck and back pain from 5 surgeries in 6 months. Pt was on pain meds in [place] but unable to find MD here so "I just drink to take my pain away". Last drink was this am- 2 beers. Pt states she feels useless and SI without a plan.
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
## 2016-07-21 ENCOUNTER — Inpatient Hospital Stay (HOSPITAL_COMMUNITY)
Admission: AD | Admit: 2016-07-21 | Discharge: 2016-07-25 | DRG: 885 | Disposition: A | Discharge status: Home / Self Care | Payer: Medicare Other | Source: Intra-hospital | Attending: Psychiatry | Admitting: Psychiatry

## 2016-07-21 ENCOUNTER — Encounter (HOSPITAL_COMMUNITY): Payer: Self-pay | Admitting: *Deleted

## 2016-07-21 DIAGNOSIS — Z79899 Other long term (current) drug therapy: Secondary | ICD-10-CM

## 2016-07-21 DIAGNOSIS — R9431 Abnormal electrocardiogram [ECG] [EKG]: Secondary | ICD-10-CM | POA: Diagnosis present

## 2016-07-21 DIAGNOSIS — Z62810 Personal history of physical and sexual abuse in childhood: Secondary | ICD-10-CM | POA: Diagnosis present

## 2016-07-21 DIAGNOSIS — F1010 Alcohol abuse, uncomplicated: Secondary | ICD-10-CM | POA: Diagnosis present

## 2016-07-21 DIAGNOSIS — G4700 Insomnia, unspecified: Secondary | ICD-10-CM | POA: Diagnosis present

## 2016-07-21 DIAGNOSIS — F1024 Alcohol dependence with alcohol-induced mood disorder: Secondary | ICD-10-CM | POA: Diagnosis present

## 2016-07-21 DIAGNOSIS — F10280 Alcohol dependence with alcohol-induced anxiety disorder: Secondary | ICD-10-CM | POA: Diagnosis present

## 2016-07-21 DIAGNOSIS — Z791 Long term (current) use of non-steroidal anti-inflammatories (NSAID): Secondary | ICD-10-CM

## 2016-07-21 DIAGNOSIS — F19929 Other psychoactive substance use, unspecified with intoxication, unspecified: Secondary | ICD-10-CM | POA: Clinically undetermined

## 2016-07-21 DIAGNOSIS — R45851 Suicidal ideations: Secondary | ICD-10-CM | POA: Diagnosis present

## 2016-07-21 DIAGNOSIS — F410 Panic disorder [episodic paroxysmal anxiety] without agoraphobia: Secondary | ICD-10-CM | POA: Diagnosis present

## 2016-07-21 DIAGNOSIS — G4710 Hypersomnia, unspecified: Secondary | ICD-10-CM | POA: Diagnosis present

## 2016-07-21 DIAGNOSIS — Y908 Blood alcohol level of 240 mg/100 ml or more: Secondary | ICD-10-CM | POA: Diagnosis present

## 2016-07-21 DIAGNOSIS — Z818 Family history of other mental and behavioral disorders: Secondary | ICD-10-CM | POA: Diagnosis not present

## 2016-07-21 DIAGNOSIS — E876 Hypokalemia: Secondary | ICD-10-CM | POA: Diagnosis present

## 2016-07-21 DIAGNOSIS — F17210 Nicotine dependence, cigarettes, uncomplicated: Secondary | ICD-10-CM | POA: Diagnosis present

## 2016-07-21 DIAGNOSIS — I4581 Long QT syndrome: Secondary | ICD-10-CM | POA: Diagnosis present

## 2016-07-21 DIAGNOSIS — F1094 Alcohol use, unspecified with alcohol-induced mood disorder: Secondary | ICD-10-CM | POA: Diagnosis present

## 2016-07-21 DIAGNOSIS — F19980 Other psychoactive substance use, unspecified with psychoactive substance-induced anxiety disorder: Secondary | ICD-10-CM | POA: Clinically undetermined

## 2016-07-21 DIAGNOSIS — F332 Major depressive disorder, recurrent severe without psychotic features: Secondary | ICD-10-CM | POA: Diagnosis present

## 2016-07-21 DIAGNOSIS — Z915 Personal history of self-harm: Secondary | ICD-10-CM | POA: Diagnosis not present

## 2016-07-21 DIAGNOSIS — E878 Other disorders of electrolyte and fluid balance, not elsewhere classified: Secondary | ICD-10-CM | POA: Diagnosis present

## 2016-07-21 DIAGNOSIS — F1014 Alcohol abuse with alcohol-induced mood disorder: Secondary | ICD-10-CM | POA: Diagnosis present

## 2016-07-21 DIAGNOSIS — F1020 Alcohol dependence, uncomplicated: Secondary | ICD-10-CM | POA: Clinically undetermined

## 2016-07-21 MED ORDER — CHLORDIAZEPOXIDE HCL 25 MG PO CAPS
25.0000 mg | ORAL_CAPSULE | Freq: Every day | ORAL | Status: AC
  Administered 2016-07-25: 25 mg via ORAL
  Filled 2016-07-21: qty 1

## 2016-07-21 MED ORDER — CHLORDIAZEPOXIDE HCL 25 MG PO CAPS
25.0000 mg | ORAL_CAPSULE | ORAL | Status: AC
  Administered 2016-07-23 – 2016-07-24 (×2): 25 mg via ORAL
  Filled 2016-07-21 (×2): qty 1

## 2016-07-21 MED ORDER — ENSURE ENLIVE PO LIQD
237.0000 mL | ORAL | Status: DC
  Administered 2016-07-24: 237 mL via ORAL

## 2016-07-21 MED ORDER — LORAZEPAM 1 MG PO TABS: 1.0000 mg | ORAL_TABLET | Freq: Two times a day (BID) | ORAL | Status: DC

## 2016-07-21 MED ORDER — ALUM & MAG HYDROXIDE-SIMETH 200-200-20 MG/5ML PO SUSP: 30.0000 mL | ORAL | Status: DC | PRN

## 2016-07-21 MED ORDER — GABAPENTIN 100 MG PO CAPS
200.0000 mg | ORAL_CAPSULE | Freq: Three times a day (TID) | ORAL | Status: DC
  Administered 2016-07-21 – 2016-07-22 (×2): 200 mg via ORAL
  Filled 2016-07-21 (×6): qty 2

## 2016-07-21 MED ORDER — HYDROXYZINE HCL 25 MG PO TABS
25.0000 mg | ORAL_TABLET | Freq: Four times a day (QID) | ORAL | Status: DC | PRN
  Administered 2016-07-21 – 2016-07-22 (×3): 25 mg via ORAL
  Filled 2016-07-21 (×3): qty 1

## 2016-07-21 MED ORDER — LORAZEPAM 1 MG PO TABS
1.0000 mg | ORAL_TABLET | Freq: Four times a day (QID) | ORAL | Status: DC | PRN
  Administered 2016-07-21 (×2): 1 mg via ORAL
  Filled 2016-07-21 (×2): qty 1

## 2016-07-21 MED ORDER — LORAZEPAM 1 MG PO TABS
1.0000 mg | ORAL_TABLET | Freq: Four times a day (QID) | ORAL | Status: DC
  Administered 2016-07-21: 1 mg via ORAL
  Filled 2016-07-21: qty 1

## 2016-07-21 MED ORDER — ENSURE ENLIVE PO LIQD
237.0000 mL | Freq: Two times a day (BID) | ORAL | Status: DC
  Administered 2016-07-21: 237 mL via ORAL

## 2016-07-21 MED ORDER — LOPERAMIDE HCL 2 MG PO CAPS
2.0000 mg | ORAL_CAPSULE | ORAL | Status: AC | PRN
  Administered 2016-07-21: 4 mg via ORAL
  Filled 2016-07-21: qty 1

## 2016-07-21 MED ORDER — LORAZEPAM 1 MG PO TABS: 1.0000 mg | ORAL_TABLET | Freq: Three times a day (TID) | ORAL | Status: DC

## 2016-07-21 MED ORDER — TRAZODONE HCL 50 MG PO TABS
50.0000 mg | ORAL_TABLET | Freq: Every evening | ORAL | Status: DC | PRN
  Administered 2016-07-21 – 2016-07-22 (×2): 50 mg via ORAL
  Filled 2016-07-21 (×2): qty 1

## 2016-07-21 MED ORDER — ONDANSETRON 4 MG PO TBDP
4.0000 mg | ORAL_TABLET | Freq: Four times a day (QID) | ORAL | Status: DC | PRN
  Administered 2016-07-21: 4 mg via ORAL
  Filled 2016-07-21 (×2): qty 1

## 2016-07-21 MED ORDER — NICOTINE 21 MG/24HR TD PT24
21.0000 mg | MEDICATED_PATCH | Freq: Every day | TRANSDERMAL | Status: DC
  Administered 2016-07-21 – 2016-07-25 (×5): 21 mg via TRANSDERMAL
  Filled 2016-07-21 (×8): qty 1

## 2016-07-21 MED ORDER — MAGNESIUM HYDROXIDE 400 MG/5ML PO SUSP: 30.0000 mL | Freq: Every day | ORAL | Status: DC | PRN

## 2016-07-21 MED ORDER — IBUPROFEN 600 MG PO TABS
ORAL_TABLET | ORAL | Status: AC
  Administered 2016-07-21: 600 mg
  Filled 2016-07-21: qty 1

## 2016-07-21 MED ORDER — ADULT MULTIVITAMIN W/MINERALS CH
1.0000 | ORAL_TABLET | Freq: Every day | ORAL | Status: DC
  Administered 2016-07-21 – 2016-07-25 (×5): 1 via ORAL
  Filled 2016-07-21 (×7): qty 1

## 2016-07-21 MED ORDER — CHLORDIAZEPOXIDE HCL 25 MG PO CAPS
25.0000 mg | ORAL_CAPSULE | Freq: Three times a day (TID) | ORAL | Status: AC
  Administered 2016-07-22 – 2016-07-23 (×3): 25 mg via ORAL
  Filled 2016-07-21 (×3): qty 1

## 2016-07-21 MED ORDER — IBUPROFEN 600 MG PO TABS
600.0000 mg | ORAL_TABLET | Freq: Three times a day (TID) | ORAL | Status: DC | PRN
  Administered 2016-07-22 – 2016-07-24 (×4): 600 mg via ORAL
  Filled 2016-07-21 (×4): qty 1

## 2016-07-21 MED ORDER — THIAMINE HCL 100 MG/ML IJ SOLN
100.0000 mg | Freq: Once | INTRAMUSCULAR | Status: AC
  Administered 2016-07-21: 100 mg via INTRAMUSCULAR

## 2016-07-21 MED ORDER — SERTRALINE HCL 25 MG PO TABS
25.0000 mg | ORAL_TABLET | Freq: Every day | ORAL | Status: DC
  Administered 2016-07-21 – 2016-07-22 (×2): 25 mg via ORAL
  Filled 2016-07-21 (×4): qty 1

## 2016-07-21 MED ORDER — CHLORDIAZEPOXIDE HCL 25 MG PO CAPS: 25.0000 mg | ORAL_CAPSULE | Freq: Four times a day (QID) | ORAL | Status: AC | PRN

## 2016-07-21 MED ORDER — CHLORDIAZEPOXIDE HCL 25 MG PO CAPS
25.0000 mg | ORAL_CAPSULE | Freq: Four times a day (QID) | ORAL | Status: AC
  Administered 2016-07-21 – 2016-07-22 (×4): 25 mg via ORAL
  Filled 2016-07-21 (×4): qty 1

## 2016-07-21 MED ORDER — LORAZEPAM 1 MG PO TABS: 1.0000 mg | ORAL_TABLET | Freq: Every day | ORAL | Status: DC

## 2016-07-21 MED ORDER — ACETAMINOPHEN 325 MG PO TABS
650.0000 mg | ORAL_TABLET | Freq: Four times a day (QID) | ORAL | Status: DC | PRN
  Administered 2016-07-21 – 2016-07-23 (×3): 650 mg via ORAL
  Filled 2016-07-21 (×3): qty 2

## 2016-07-21 MED ORDER — VITAMIN B-1 100 MG PO TABS
100.0000 mg | ORAL_TABLET | Freq: Every day | ORAL | Status: DC
  Administered 2016-07-22 – 2016-07-25 (×4): 100 mg via ORAL
  Filled 2016-07-21 (×6): qty 1

## 2016-07-21 NOTE — [facility] Group Notes (Signed)
[place] Group Notes:  (Nursing/MHT/Case Management/Adjunct)  Date:  07/21/2016  Time:  2:51 PM  Type of Therapy:  Nurse Education  Participation Level:  Active  Participation Quality:  Appropriate  Affect:  Angry and Appropriate  Cognitive:  Alert and Appropriate  Insight:  Appropriate and Good  Engagement in Group:  Engaged  Modes of Intervention:  Discussion and Education  Summary of Progress/Problems:  This group was an educational group on managing automatic emotions, behavior and coping skills.   Cheri Kearns 07/21/2016, 2:51 PM
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
## 2016-07-21 NOTE — Tx Team (Signed)
Initial Treatment Plan 07/21/2016 1:46 AM [name]    PATIENT STRESSORS: Health problems Medication change or noncompliance Substance abuse   PATIENT STRENGTHS: Average or above average intelligence Communication skills Financial means General fund of knowledge Motivation for treatment/growth Supportive family/friends   PATIENT IDENTIFIED PROBLEMS:   " get myself together"  "get sober"  Depression  Hx of back surgeries  headaches           DISCHARGE CRITERIA:  Adequate post-discharge living arrangements Improved stabilization in mood, thinking, and/or behavior Reduction of life-threatening or endangering symptoms to within safe limits  PRELIMINARY DISCHARGE PLAN: Attend aftercare/continuing care group Attend PHP/IOP Participate in family therapy Return to previous living arrangement  PATIENT/FAMILY INVOLVEMENT: This treatment plan has been presented to and reviewed with the patient, Laura [name], and/or family member.  The patient and family have been given the opportunity to ask questions and make suggestions.  Zoe Lan, RN 07/21/2016, 1:46 AM

## 2016-07-21 NOTE — [facility] Group Notes (Signed)
[facility] Group Notes: (Clinical Social Work)   07/21/2016      Type of Therapy:  Group Therapy   Participation Level:  Did Not Attend despite MHT prompting   Selmer Dominion, LCSW 07/21/2016, 12:57 PM
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
## 2016-07-21 NOTE — Progress Notes (Signed)
Attended AA group
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
## 2016-07-21 NOTE — BHH Suicide Risk Assessment (Signed)
[place] INPATIENT:  Family/Significant Other Suicide Prevention Education  Suicide Prevention Education:  Patient Refusal for Family/Significant Other Suicide Prevention Education: The patient Laura [name] has refused to provide written consent for family/significant other to be provided Family/Significant Other Suicide Prevention Education during admission and/or prior to discharge.  Physician notified.  SPE brochure was given to patient, and it was reviewed with her.  Berlin Hun Grossman-Orr 07/21/2016, 1:03 PM

## 2016-07-21 NOTE — Progress Notes (Signed)
NUTRITION ASSESSMENT  Pt identified as at risk on the Malnutrition Screen Tool  INTERVENTION: 1. Educated patient on the importance of nutrition and encouraged intake of food and beverages. 2. Discussed weight goals. 3. Supplements: will decrease Ensure Enlive to once/day, this supplement provides 350 kcal and 20 grams of protein.  NUTRITION DIAGNOSIS: Unintentional weight loss related to sub-optimal intake as evidenced by pt report.   Goal: Pt to meet >/= 90% of their estimated nutrition needs.  Monitor:  PO intake  Assessment:  Pt admitted for alcohol abuse which includes drinking 4 pints of vodka/day. Pt with hx of tremor when she stops drinking. Her last drink was ~8 hours PTA. She reports that d/t alcohol abuse and chronic pain, she has lost 20 lbs in the past "few months." Limited weight hx available in the chart but shows stable weight over the past 1 month. Based on pt's report, she lost 11% body weight in unknown number of months which may or may not be significant for time frame. Ensure Jeanne Ivan has already been ordered but will decrease to once/day. Continue to encourage PO intakes of meals, supplement, and snacks.     46 y.o. female  Height: Ht Readings from Last 1 Encounters:  07/21/16 5' 6.5" (1.689 m)    Weight: Wt Readings from Last 1 Encounters:  07/21/16 162 lb (73.5 kg)    Weight Hx: Wt Readings from Last 10 Encounters:  07/21/16 162 lb (73.5 kg)  07/02/16 158 lb 3.2 oz (71.8 kg)  06/29/16 161 lb (73 kg)    BMI:  Body mass index is 25.76 kg/m. Pt meets criteria for overweight status based on current BMI.  Estimated Nutritional Needs: Kcal: 25-30 kcal/kg Protein: > 1 gram protein/kg Fluid: 1 ml/kcal  Diet Order: Diet regular Room service appropriate? Yes; Fluid consistency: Thin Pt is also offered choice of unit snacks mid-morning and mid-afternoon.  Pt is eating as desired.   Lab results and medications reviewed.     Jarome Matin, MS, RD,  LDN, [facility] Inpatient Clinical Dietitian Pager # [phone] After hours/weekend pager # [phone]
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
## 2016-07-21 NOTE — Progress Notes (Signed)
Data. Patient denies SI/HI/AVH. Patient interacting minimallyl with staff and other patients. Spent most of the shift in bed. Visible tremors to bilat hands. Complaining of, Severe detox. I don't think the ativan is working." MD notified and detox orders modified. Multiple medication requests. Complains of nausea, and found relif with Zofran. Her affect is flat and she re[ports feeling, "Very sad", today. She reports her goal for today is, "To work on my sobriety and depression." Action. Emotional support and encouragement offered. Education provided on medication, indications and side effect. Q 15 minute checks done for safety. Response. Safety on the unit maintained through 15 minute checks.  Medications taken as prescribed. Attended groups. Remained calm and appropriate through out shift.

## 2016-07-22 MED ORDER — SERTRALINE HCL 50 MG PO TABS
50.0000 mg | ORAL_TABLET | Freq: Every day | ORAL | Status: DC
  Administered 2016-07-23: 50 mg via ORAL
  Filled 2016-07-22 (×3): qty 1

## 2016-07-22 MED ORDER — GABAPENTIN 300 MG PO CAPS
300.0000 mg | ORAL_CAPSULE | Freq: Three times a day (TID) | ORAL | Status: DC
  Administered 2016-07-22 – 2016-07-25 (×10): 300 mg via ORAL
  Filled 2016-07-22: qty 21
  Filled 2016-07-22 (×3): qty 1
  Filled 2016-07-22: qty 21
  Filled 2016-07-22 (×4): qty 1
  Filled 2016-07-22: qty 21
  Filled 2016-07-22 (×5): qty 1

## 2016-07-22 NOTE — Progress Notes (Signed)
Patient did attend the evening speaker AA meeting.  

## 2016-07-22 NOTE — Progress Notes (Signed)
  Patient ID: Laura [name], female   DOB: Sep 08, 1970, 46 y.o.   MRN: [id number]  Pt currently presents with a flat affect and anxious, restless behavior. Pt reports to writer that their goal is to "get some sleep tonight." Pt forwards little to staff. Pt attends AA group tonight. Pt reports good sleep with current medication regimen.   Pt provided with medications per providers orders. Pt's labs and vitals were monitored throughout the night. Pt given a 1:1 about emotional and mental status. Pt supported and encouraged to express concerns and questions. Pt educated on medications.  Pt's safety ensured with 15 minute and environmental checks. Pt currently denies SI/HI and A/V hallucinations. Pt verbally agrees to seek staff if SI/HI or A/VH occurs and to consult with staff before acting on any harmful thoughts. Pt seen interacting positively with peers. Will continue POC.

## 2016-07-22 NOTE — Progress Notes (Signed)
[facility] MD Progress Note  07/22/2016 11:09 AM Laura [name]  MRN:  [id number] Subjective:  I still feel depressed and anxious.  But am sleeping better. Objective; patient seen chart reviewed.  Patient is sleeping better but she continued to feel anxious nervous and depressed.  She is taking gabapentin and Zoloft.  She continues to have racing thoughts and mild tremors in her hand.  Her blood alcohol level was 318.  Patient like to stay with her father and her son in [place].  She recently broke up with her boyfriend.  Patient continues to have hopelessness and worthlessness.  She is going to groups but remains very quite and withdrawn.  She is also complaining of neck pain and she feels gabapentin helping some of the neck pain.  Patient is still have passive and fleeting suicidal thoughts but no plan or any intent.  Principal Problem: Alcohol-induced mood disorder ([place]) Diagnosis:   Patient Active Problem List   Diagnosis Date Noted  . Alcohol-induced mood disorder ([place]) [F10.94] 07/21/2016  . Alcohol abuse [F10.10] 07/21/2016  . MDD (major depressive disorder), recurrent severe, without psychosis ([place]) [F33.2] 07/21/2016  . Osteoarthritis of spine with radiculopathy, cervical region [facility] 07/02/2016  . Substance abuse [F19.10] 07/02/2016  . Lumbar spondylosis [M47.816] 07/02/2016   Total Time spent with patient: 20 minutes  Past Psychiatric History: Reviewed.  Past Medical History:  Past Medical History:  Diagnosis Date  . Anxiety   . Substance abuse     Past Surgical History:  Procedure Laterality Date  . BACK SURGERY    . KNEE SURGERY     Family History:  Family History  Problem Relation Age of Onset  . Family history unknown: Yes   Family Psychiatric  History: Reviewed. Social History:  History  Alcohol Use  . Yes    Comment: 3-4 pints a day     History  Drug Use No    Comment: denies     Social History   Social History  . Marital status: Single     Spouse name: N/A  . Number of children: N/A  . Years of education: N/A   Social History Main Topics  . Smoking status: Current Every Day Smoker    Packs/day: 1.00  . Smokeless tobacco: Never Used  . Alcohol use Yes     Comment: 3-4 pints a day  . Drug use: No     Comment: denies   . Sexual activity: No   Other Topics Concern  . None   Social History Narrative  . None   Additional Social History:                         Sleep: Fair  Appetite:  Fair  Current Medications: Current Facility-Administered Medications  Medication Dose Route Frequency Provider Last Rate Last Dose  . acetaminophen (TYLENOL) tablet 650 mg  650 mg Oral Q6H PRN Lindon Romp A, NP   650 mg at 07/21/16 1612  . alum & mag hydroxide-simeth (MAALOX/MYLANTA) 200-200-20 MG/5ML suspension 30 mL  30 mL Oral Q4H PRN Lindon Romp A, NP      . chlordiazePOXIDE (LIBRIUM) capsule 25 mg  25 mg Oral Q6H PRN Withrow, Elyse Jarvis, FNP      . chlordiazePOXIDE (LIBRIUM) capsule 25 mg  25 mg Oral TID Benjamine Mola, FNP       Followed by  . [START ON 07/23/2016] chlordiazePOXIDE (LIBRIUM) capsule 25 mg  25 mg Oral BH-qamhs  Withrow, Elyse Jarvis, FNP       Followed by  . [START ON 07/25/2016] chlordiazePOXIDE (LIBRIUM) capsule 25 mg  25 mg Oral Daily Withrow, Elyse Jarvis, FNP      . feeding supplement (ENSURE ENLIVE) (ENSURE ENLIVE) liquid 237 mL  237 mL Oral Q24H Eappen, Saramma, MD      . gabapentin (NEURONTIN) capsule 200 mg  200 mg Oral TID Benjamine Mola, FNP   200 mg at 07/22/16 0827  . hydrOXYzine (ATARAX/VISTARIL) tablet 25 mg  25 mg Oral Q6H PRN Lindon Romp A, NP   25 mg at 07/21/16 2123  . ibuprofen (ADVIL,MOTRIN) tablet 600 mg  600 mg Oral Q8H PRN Lindon Romp A, NP   600 mg at 07/22/16 0831  . loperamide (IMODIUM) capsule 2-4 mg  2-4 mg Oral PRN Lindon Romp A, NP   4 mg at 07/21/16 0906  . magnesium hydroxide (MILK OF MAGNESIA) suspension 30 mL  30 mL Oral Daily PRN Lindon Romp A, NP      . multivitamin with minerals  tablet 1 tablet  1 tablet Oral Daily Lindon Romp A, NP   1 tablet at 07/22/16 0827  . nicotine (NICODERM CQ - dosed in mg/24 hours) patch 21 mg  21 mg Transdermal Daily Cobos, Myer Peer, MD   21 mg at 07/22/16 0827  . ondansetron (ZOFRAN-ODT) disintegrating tablet 4 mg  4 mg Oral Q6H PRN Lindon Romp A, NP   4 mg at 07/21/16 1056  . sertraline (ZOLOFT) tablet 25 mg  25 mg Oral Daily Benjamine Mola, FNP   25 mg at 07/22/16 0827  . thiamine (VITAMIN B-1) tablet 100 mg  100 mg Oral Daily Benjamine Mola, FNP   100 mg at 07/22/16 0827  . traZODone (DESYREL) tablet 50 mg  50 mg Oral QHS PRN Benjamine Mola, FNP   50 mg at 07/21/16 2123    Lab Results:  Results for orders placed or performed during the hospital encounter of 07/20/16 (from the past 48 hour(s))  Comprehensive metabolic panel     Status: Abnormal   Collection Time: 07/20/16  2:33 PM  Result Value Ref Range   Sodium 143 135 - 145 mmol/L   Potassium 3.3 (L) 3.5 - 5.1 mmol/L   Chloride 106 101 - 111 mmol/L   CO2 22 22 - 32 mmol/L   Glucose, Bld 169 (H) 65 - 99 mg/dL   BUN 9 6 - 20 mg/dL   Creatinine, Ser 0.69 0.44 - 1.00 mg/dL   Calcium 8.9 8.9 - 10.3 mg/dL   Total Protein 7.6 6.5 - 8.1 g/dL   Albumin 4.2 3.5 - 5.0 g/dL   AST 48 (H) 15 - 41 U/L   ALT 40 14 - 54 U/L   Alkaline Phosphatase 121 38 - 126 U/L   Total Bilirubin 0.9 0.3 - 1.2 mg/dL   GFR calc non Af Amer >60 >60 mL/min   GFR calc Af Amer >60 >60 mL/min    Comment: (NOTE) The eGFR has been calculated using the CKD EPI equation. This calculation has not been validated in all clinical situations. eGFR's persistently <60 mL/min signify possible Chronic Kidney Disease.    Anion gap 15 5 - 15  Ethanol     Status: Abnormal   Collection Time: 07/20/16  2:33 PM  Result Value Ref Range   Alcohol, Ethyl (B) 318 (HH) <5 mg/dL    Comment:        LOWEST DETECTABLE LIMIT FOR SERUM  ALCOHOL IS 5 mg/dL FOR MEDICAL PURPOSES ONLY CRITICAL RESULT CALLED TO, READ BACK BY AND  VERIFIED WITH: Joylene Draft RN [id number] GREEN R   CBC with Diff     Status: None   Collection Time: 07/20/16  2:33 PM  Result Value Ref Range   WBC 6.4 4.0 - 10.5 K/uL   RBC 4.14 3.87 - 5.11 MIL/uL   Hemoglobin 13.8 12.0 - 15.0 g/dL   HCT 40.8 36.0 - 46.0 %   MCV 98.6 78.0 - 100.0 fL   MCH 33.3 26.0 - 34.0 pg   MCHC 33.8 30.0 - 36.0 g/dL   RDW 14.1 11.5 - 15.5 %   Platelets 165 150 - 400 K/uL   Neutrophils Relative % 52 %   Neutro Abs 3.4 1.7 - 7.7 K/uL   Lymphocytes Relative 40 %   Lymphs Abs 2.6 0.7 - 4.0 K/uL   Monocytes Relative 5 %   Monocytes Absolute 0.3 0.1 - 1.0 K/uL   Eosinophils Relative 2 %   Eosinophils Absolute 0.1 0.0 - 0.7 K/uL   Basophils Relative 1 %   Basophils Absolute 0.0 0.0 - 0.1 K/uL  I-Stat Chem 8, ED  (not at [facility], [facility])     Status: Abnormal   Collection Time: 07/20/16  2:45 PM  Result Value Ref Range   Sodium 145 135 - 145 mmol/L   Potassium 3.2 (L) 3.5 - 5.1 mmol/L   Chloride 108 101 - 111 mmol/L   BUN 10 6 - 20 mg/dL   Creatinine, Ser 1.00 0.44 - 1.00 mg/dL   Glucose, Bld 171 (H) 65 - 99 mg/dL   Calcium, Ion 1.01 (L) 1.15 - 1.40 mmol/L   TCO2 24 0 - 100 mmol/L   Hemoglobin 15.0 12.0 - 15.0 g/dL   HCT 44.0 36.0 - 46.0 %  Urine rapid drug screen (hosp performed)not at [facility]     Status: None   Collection Time: 07/20/16  3:05 PM  Result Value Ref Range   Opiates NONE DETECTED NONE DETECTED   Cocaine NONE DETECTED NONE DETECTED   Benzodiazepines NONE DETECTED NONE DETECTED   Amphetamines NONE DETECTED NONE DETECTED   Tetrahydrocannabinol NONE DETECTED NONE DETECTED   Barbiturates NONE DETECTED NONE DETECTED    Comment:        DRUG SCREEN FOR MEDICAL PURPOSES ONLY.  IF CONFIRMATION IS NEEDED FOR ANY PURPOSE, NOTIFY LAB WITHIN 5 DAYS.        LOWEST DETECTABLE LIMITS FOR URINE DRUG SCREEN Drug Class       Cutoff (ng/mL) Amphetamine      1000 Barbiturate      200 Benzodiazepine   382 Tricyclics       505 Opiates          300 Cocaine           300 THC              50   Salicylate level     Status: None   Collection Time: 07/20/16  3:10 PM  Result Value Ref Range   Salicylate Lvl <3.9 2.8 - 30.0 mg/dL  Acetaminophen level     Status: Abnormal   Collection Time: 07/20/16  3:10 PM  Result Value Ref Range   Acetaminophen (Tylenol), Serum <10 (L) 10 - 30 ug/mL    Comment:        THERAPEUTIC CONCENTRATIONS VARY SIGNIFICANTLY. A RANGE OF 10-30 ug/mL MAY BE AN EFFECTIVE CONCENTRATION FOR MANY PATIENTS. HOWEVER, SOME ARE BEST TREATED AT  CONCENTRATIONS OUTSIDE THIS RANGE. ACETAMINOPHEN CONCENTRATIONS >150 ug/mL AT 4 HOURS AFTER INGESTION AND >50 ug/mL AT 12 HOURS AFTER INGESTION ARE OFTEN ASSOCIATED WITH TOXIC REACTIONS.     Blood Alcohol level:  Lab Results  Component Value Date   ETH 318 (HH) 35/57/3220    Metabolic Disorder Labs: No results found for: HGBA1C, MPG No results found for: PROLACTIN No results found for: CHOL, TRIG, HDL, CHOLHDL, VLDL, LDLCALC  Physical Findings: AIMS: Facial and Oral Movements Muscles of Facial Expression: None, normal Lips and Perioral Area: None, normal Jaw: None, normal Tongue: None, normal,Extremity Movements Upper (arms, wrists, hands, fingers): None, normal Lower (legs, knees, ankles, toes): None, normal, Trunk Movements Neck, shoulders, hips: None, normal, Overall Severity Severity of abnormal movements (highest score from questions above): None, normal Incapacitation due to abnormal movements: None, normal Patient's awareness of abnormal movements (rate only patient's report): No Awareness, Dental Status Current problems with teeth and/or dentures?: No Does patient usually wear dentures?: No  CIWA:  CIWA-Ar Total: 10 COWS:     Musculoskeletal: Strength & Muscle Tone: within normal limits Gait & Station: normal Patient leans: N/A  Psychiatric Specialty Exam: Physical Exam  Review of Systems  Constitutional: Negative.   Cardiovascular: Negative.    Musculoskeletal: Positive for back pain, joint pain and neck pain.  Neurological: Negative.   Psychiatric/Behavioral: Positive for depression. The patient is nervous/anxious.     Blood pressure 113/77, pulse 97, temperature 97.4 F (36.3 C), resp. rate 18, height 5' 6.5" (1.689 m), weight 73.5 kg (162 lb), SpO2 100 %.Body mass index is 25.76 kg/m.  General Appearance: Casual  Eye Contact:  Fair  Speech:  Slow  Volume:  Normal  Mood:  Anxious, Depressed and Dysphoric  Affect:  Constricted and Depressed  Thought Process:  Goal Directed  Orientation:  Full (Time, Place, and Person)  Thought Content:  Logical and Rumination  Suicidal Thoughts:  Yes.  without intent/plan  Homicidal Thoughts:  No  Memory:  Immediate;   Good Recent;   Good Remote;   Good  Judgement:  Good  Insight:  Fair  Psychomotor Activity:  Tremor  Concentration:  Concentration: Fair and Attention Span: Fair  Recall:  Good  Fund of Knowledge:  Good  Language:  Good  Akathisia:  No  Handed:  Right  AIMS (if indicated):     Assets:  Communication Skills Desire for Improvement Housing Social Support  ADL's:  Intact  Cognition:  WNL  Sleep:  Number of Hours: 2.75     Treatment Plan Summary: Daily contact with patient to assess and evaluate symptoms and progress in treatment and Medication management  Continue Librium protocol to help with alcohol withdrawal symptoms. Increase Zoloft 50 mg daily to help her depression. Increase gabapentin 300 mg 3 times a day to help her anxiety, chronic pain and neck pain. Continue trazodone 50 mg at bedtime for insomnia. Continue Vistaril as needed for anxiety. Encouraged to participate in group milieu therapy. Reviewed blood work, potassium 3.2 we will repeat potassium. Glucose 171 and creatinine 1.0.  AST 48 and other liver function tests are normal.  CBC is normal.  Hemoglobin A1c and lipid panel pending.  Jocelyne Reinertsen T., MD 07/22/2016, 11:09 AM

## 2016-07-22 NOTE — B[facility] Group Notes (Signed)
[place] Group Notes:  (Clinical Social Work)  07/22/2016  11:00AM-12:00PM  Summary of Progress/Problems:  The main focus of today's process group was to listen to a variety of genres of music and to identify that different types of music provoke different responses.  The patient then was able to identify personally what was soothing for them, as well as energizing, as well as how patient can personally use this knowledge in sleep habits, with depression, and with other symptoms.  The patient expressed at the beginning of group the overall feeling of tired, and at the end of group said the music had helped and she was less tired.  During group, she had side conversations with one particular patient, frequently laughing over song choices and asking that they be changed.  Type of Therapy:  Music Therapy   Participation Level:  Active  Participation Quality:  Attentive and Resistant  Affect:  Appropriate  Cognitive:  Oriented  Insight:  Poor  Engagement in Therapy:  Limited  Modes of Intervention:   Activity, Exploration  Selmer Dominion, LCSW 07/22/2016

## 2016-07-22 NOTE — Progress Notes (Signed)
DAR NOTE: Patient presents with anxious affect and depressed mood. Pt has been visible in the milieu interacting with peers. Pt complained of neck pain. Denies auditory and visual hallucinations.  Rates depression at 5, hopelessness at 2, and anxiety at 7.  Maintained on routine safety checks.  Medications given as prescribed.  Support and encouragement offered as needed.  Attended group and participated.  States goal for today is " sobriety."  Patient observed socializing with peers in the dayroom.  Offered no complaint.

## 2016-07-23 DIAGNOSIS — R9431 Abnormal electrocardiogram [ECG] [EKG]: Secondary | ICD-10-CM

## 2016-07-23 DIAGNOSIS — E878 Other disorders of electrolyte and fluid balance, not elsewhere classified: Secondary | ICD-10-CM | POA: Diagnosis present

## 2016-07-23 DIAGNOSIS — F19929 Other psychoactive substance use, unspecified with intoxication, unspecified: Secondary | ICD-10-CM

## 2016-07-23 DIAGNOSIS — F1020 Alcohol dependence, uncomplicated: Secondary | ICD-10-CM

## 2016-07-23 DIAGNOSIS — F19980 Other psychoactive substance use, unspecified with psychoactive substance-induced anxiety disorder: Secondary | ICD-10-CM

## 2016-07-23 DIAGNOSIS — F17210 Nicotine dependence, cigarettes, uncomplicated: Secondary | ICD-10-CM

## 2016-07-23 DIAGNOSIS — E876 Hypokalemia: Secondary | ICD-10-CM

## 2016-07-23 LAB — BASIC METABOLIC PANEL
ANION GAP: 7 (ref 5–15)
BUN: 11 mg/dL (ref 6–20)
CO2: 27 mmol/L (ref 22–32)
Calcium: 9.3 mg/dL (ref 8.9–10.3)
Chloride: 107 mmol/L (ref 101–111)
Creatinine, Ser: 0.66 mg/dL (ref 0.44–1.00)
GFR calc Af Amer: >60 mL/min (ref >60)
GFR calc non Af Amer: >60 mL/min (ref >60)
Glucose, Bld: 137 mg/dL — ABNORMAL HIGH (ref 65–99)
POTASSIUM: 3.9 mmol/L (ref 3.5–5.1)
SODIUM: 141 mmol/L (ref 135–145)

## 2016-07-23 LAB — LIPID PANEL
Cholesterol: 239 mg/dL — ABNORMAL HIGH (ref 0–200)
HDL: 87 mg/dL (ref >40)
LDL Cholesterol: 133 mg/dL — ABNORMAL HIGH (ref 0–99)
Total CHOL/HDL Ratio: 2.7 RATIO
Triglycerides: 95 mg/dL (ref <150)
VLDL: 19 mg/dL (ref 0–40)

## 2016-07-23 MED ORDER — QUETIAPINE FUMARATE 100 MG PO TABS
100.0000 mg | ORAL_TABLET | Freq: Every day | ORAL | Status: DC
  Filled 2016-07-23 (×2): qty 1

## 2016-07-23 MED ORDER — METHOCARBAMOL 500 MG PO TABS
500.0000 mg | ORAL_TABLET | Freq: Three times a day (TID) | ORAL | Status: DC
  Administered 2016-07-23 – 2016-07-25 (×6): 500 mg via ORAL
  Filled 2016-07-23: qty 21
  Filled 2016-07-23 (×5): qty 1
  Filled 2016-07-23: qty 21
  Filled 2016-07-23: qty 1
  Filled 2016-07-23: qty 21
  Filled 2016-07-23 (×3): qty 1

## 2016-07-23 MED ORDER — ESZOPICLONE 2 MG PO TABS
1.0000 mg | ORAL_TABLET | Freq: Every day | ORAL | Status: DC
  Administered 2016-07-23: 1 mg via ORAL
  Filled 2016-07-23: qty 1

## 2016-07-23 MED ORDER — NON FORMULARY: 1.0000 mg | Freq: Every day | Status: DC

## 2016-07-23 MED ORDER — TRAZODONE HCL 50 MG PO TABS
50.0000 mg | ORAL_TABLET | Freq: Every evening | ORAL | Status: DC | PRN
  Administered 2016-07-23: 50 mg via ORAL
  Filled 2016-07-23 (×6): qty 1

## 2016-07-23 NOTE — B[facility] Group Notes (Signed)
[place] LCSW Group Therapy  07/23/2016 1:15 pm  Type of Therapy: Process Group Therapy  Participation Level:  Active  Participation Quality:  Appropriate  Affect:  Flat  Cognitive:  Oriented  Insight:  Improving  Engagement in Group:  Limited  Engagement in Therapy:  Limited  Modes of Intervention:  Activity, Clarification, Education, Problem-solving and Support  Summary of Progress/Problems: Today's group addressed the issue of overcoming obstacles.  Patients were asked to identify their biggest obstacle post d/c that stands in the way of their on-going success, and then problem solve as to how to manage this. Stayed the entire time, engaged throughout.  "I moved her from [place] 3 months ago, I don't have any supports, my car broke down, my fiance broke up with me, I have chronic pain and depression, and I was drinking too much before I came in."  Says that attending to her children's needs helps her stay focused.  Feels good about coming here "so I could get back on track again."  Trish Mage 07/23/2016   3:21 PM

## 2016-07-23 NOTE — Progress Notes (Signed)
[place] Group Notes:  (Nursing/MHT/Case Management/Adjunct)  Date:  07/23/2016  Time:  11:22 PM  Type of Therapy:  Psychoeducational Skills  Participation Level:  Active  Participation Quality:  Appropriate  Affect:  Blunted  Cognitive:  Lacking  Insight:  Limited  Engagement in Group:  Engaged  Modes of Intervention:  Education  Summary of Progress/Problems: The patient stated that she had a good day, but had nothing but negative things to share about her day. First of all, she mentioned that she was unhappy about the results from her EKG and that she was very concerned. Secondly, she expressed that she continues to experience neck pain and has not had adequate relief. Finally, the patient verbalized that she just received the proper medication after waiting two days for it. As for the theme of the day, her wellness strategy will include not having any neck pain and to find out more about her EKG results.   Laura [name] 07/23/2016, 11:22 PM

## 2016-07-23 NOTE — Progress Notes (Signed)
[facility] MD Progress Note  07/23/2016 2:27 PM Laura [name]  MRN:  [id number] Subjective:  Pt states " I am still having some anxiety issues."  Objective:Patient seen and chart reviewed.Discussed patient with treatment team.   Pt today seen as anxious , dysphoric , continues to ruminate about her relational stressors. Pt reports she moved to [place] to be with her father who had stroke as well as since her exboyfriend had told her he will join her here soon. However , he broke up with her without giving any reasons and refuses to give any explanations. Pt reports that she started using alcohol to self medicate self , she understands she should not have done it. She still have passive SI on and off, but is coping better and contracts for safety.Pt reports sleep as restless, reports she was on seroquel in the past which helped, she used to be on 300 mg. Per staff , pt continues to be depressed, has somatic issues , continues to need a lot of support.     Principal Problem: MDD (major depressive disorder), recurrent severe, without psychosis ([place]) Diagnosis:   Patient Active Problem List   Diagnosis Date Noted  . Alcohol use disorder, moderate, dependence ([place]) [F10.20] 07/23/2016  . 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 Hypokalemia [E87.6] 07/23/2016  . Prolonged Q-T interval on ECG [R94.31] 07/23/2016  . MDD (major depressive disorder), recurrent severe, without psychosis ([place]) [F33.2] 07/21/2016  . Osteoarthritis of spine with radiculopathy, cervical region [facility] 07/02/2016  . Substance abuse [F19.10] 07/02/2016  . Lumbar spondylosis [M47.816] 07/02/2016   Total Time spent with patient: 25 minutes  Past Psychiatric History: Reviewed.  Past Medical History:  Past Medical History:  Diagnosis Date  . Anxiety   . Substance abuse     Past Surgical History:  Procedure Laterality Date  . BACK SURGERY     . KNEE SURGERY     Family History:  Family History  Problem Relation Age of Onset  . Family history unknown: Yes   Family Psychiatric  History: Reviewed. Social History:  History  Alcohol Use  . Yes    Comment: 3-4 pints a day     History  Drug Use No    Comment: denies     Social History   Social History  . Marital status: Single    Spouse name: N/A  . Number of children: N/A  . Years of education: N/A   Social History Main Topics  . Smoking status: Current Every Day Smoker    Packs/day: 1.00  . Smokeless tobacco: Never Used  . Alcohol use Yes     Comment: 3-4 pints a day  . Drug use: No     Comment: denies   . Sexual activity: No   Other Topics Concern  . None   Social History Narrative  . None   Additional Social History:                         Sleep: Poor  Appetite:  Fair  Current Medications: Current Facility-Administered Medications  Medication Dose Route Frequency Provider Last Rate Last Dose  . acetaminophen (TYLENOL) tablet 650 mg  650 mg Oral Q6H PRN Lindon Romp A, NP   650 mg at 07/21/16 1612  . alum & mag hydroxide-simeth (MAALOX/MYLANTA) 200-200-20 MG/5ML suspension 30 mL  30 mL Oral Q4H PRN Rozetta Nunnery, NP      .  chlordiazePOXIDE (LIBRIUM) capsule 25 mg  25 mg Oral Q6H PRN Withrow, Elyse Jarvis, FNP      . chlordiazePOXIDE (LIBRIUM) capsule 25 mg  25 mg Oral BH-qamhs Withrow, Elyse Jarvis, FNP       Followed by  . [START ON 07/25/2016] chlordiazePOXIDE (LIBRIUM) capsule 25 mg  25 mg Oral Daily Withrow, Elyse Jarvis, FNP      . feeding supplement (ENSURE ENLIVE) (ENSURE ENLIVE) liquid 237 mL  237 mL Oral Q24H Adalbert Alberto, MD      . gabapentin (NEURONTIN) capsule 300 mg  300 mg Oral TID Berniece Andreas T, MD   300 mg at 07/23/16 1211  . ibuprofen (ADVIL,MOTRIN) tablet 600 mg  600 mg Oral Q8H PRN Lindon Romp A, NP   600 mg at 07/22/16 2113  . loperamide (IMODIUM) capsule 2-4 mg  2-4 mg Oral PRN Lindon Romp A, NP   4 mg at 07/21/16 0906  .  magnesium hydroxide (MILK OF MAGNESIA) suspension 30 mL  30 mL Oral Daily PRN Lindon Romp A, NP      . multivitamin with minerals tablet 1 tablet  1 tablet Oral Daily Lindon Romp A, NP   1 tablet at 07/23/16 0802  . nicotine (NICODERM CQ - dosed in mg/24 hours) patch 21 mg  21 mg Transdermal Daily Cobos, Myer Peer, MD   21 mg at 07/23/16 0805  . NON FORMULARY 1 mg  1 mg Oral QHS Deval Mroczka, MD      . thiamine (VITAMIN B-1) tablet 100 mg  100 mg Oral Daily Withrow, Elyse Jarvis, FNP   100 mg at 07/23/16 1607    Lab Results:  Results for orders placed or performed during the hospital encounter of 07/21/16 (from the past 48 hour(s))  Lipid panel     Status: Abnormal   Collection Time: 07/23/16  6:07 AM  Result Value Ref Range   Cholesterol 239 (H) 0 - 200 mg/dL   Triglycerides 95 <150 mg/dL   HDL 87 >40 mg/dL   Total CHOL/HDL Ratio 2.7 RATIO   VLDL 19 0 - 40 mg/dL   LDL Cholesterol 133 (H) 0 - 99 mg/dL    Comment:        Total Cholesterol/HDL:CHD Risk Coronary Heart Disease Risk Table                     Men   Women  1/2 Average Risk   3.4   3.3  Average Risk       5.0   4.4  2 X Average Risk   9.6   7.1  3 X Average Risk  23.4   11.0        Use the calculated Patient Ratio above and the CHD Risk Table to determine the patient's CHD Risk.        ATP III CLASSIFICATION (LDL):  <100     mg/dL   Optimal  100-129  mg/dL   Near or Above                    Optimal  130-159  mg/dL   Borderline  160-189  mg/dL   High  >190     mg/dL   Very High Performed at [facility] [address], [place], [place] [address]     Blood Alcohol level:  Lab Results  Component Value Date   ETH [facility]) 26/94/8546    Metabolic Disorder Labs: No results found for: HGBA1C,  MPG No results found for: PROLACTIN Lab Results  Component Value Date   CHOL 239 (H) 07/23/2016   TRIG 95 07/23/2016   HDL 87 07/23/2016   CHOLHDL 2.7 07/23/2016   VLDL 19 07/23/2016   LDLCALC 133 (H) 07/23/2016     Physical Findings: AIMS: Facial and Oral Movements Muscles of Facial Expression: None, normal Lips and Perioral Area: None, normal Jaw: None, normal Tongue: None, normal,Extremity Movements Upper (arms, wrists, hands, fingers): None, normal Lower (legs, knees, ankles, toes): None, normal, Trunk Movements Neck, shoulders, hips: None, normal, Overall Severity Severity of abnormal movements (highest score from questions above): None, normal Incapacitation due to abnormal movements: None, normal Patient's awareness of abnormal movements (rate only patient's report): No Awareness, Dental Status Current problems with teeth and/or dentures?: No Does patient usually wear dentures?: No  CIWA:  CIWA-Ar Total: 10 COWS:     Musculoskeletal: Strength & Muscle Tone: within normal limits Gait & Station: normal Patient leans: N/A  Psychiatric Specialty Exam: Physical Exam  Nursing note and vitals reviewed.   Review of Systems  Musculoskeletal: Positive for back pain and neck pain.  Neurological: Negative.   Psychiatric/Behavioral: Positive for depression and substance abuse. The patient is nervous/anxious.   All other systems reviewed and are negative.   Blood pressure 112/68, pulse 91, temperature 98 F (36.7 C), temperature source Oral, resp. rate 16, height 5' 6.5" (1.689 m), weight 73.5 kg (162 lb), SpO2 100 %.Body mass index is 25.76 kg/m.  General Appearance: Casual  Eye Contact:  Fair  Speech:  Normal Rate  Volume:  Normal  Mood:  Anxious, Depressed and Dysphoric  Affect:  Depressed  Thought Process:  Goal Directed and Descriptions of Associations: Circumstantial  Orientation:  Full (Time, Place, and Person)  Thought Content:  Logical and Rumination  Suicidal Thoughts:  yes, on and off , coping better , contracts for safety  Homicidal Thoughts:  No  Memory:  Immediate;   Fair Recent;   Fair Remote;   Fair  Judgement:  Fair  Insight:  Fair  Psychomotor Activity:  Normal   Concentration:  Concentration: Fair and Attention Span: Fair  Recall:  [employer] of Knowledge:  Fair  Language:  Fair  Akathisia:  No  Handed:  Right  AIMS (if indicated):     Assets:  Communication Skills Desire for Improvement Housing Social Support  ADL's:  Intact  Cognition:  WNL  Sleep:  Number of Hours: 6     MDD (major depressive disorder), recurrent severe, without psychosis ([place]) unstable - improving  Will continue today 07/23/16 plan as below except where it is noted.     Treatment Plan Summary:Pt presented with worsening depression as well as SI and alcohol abuse after a recent break up with boyfriend of 15 yrs. Pt continues to make progress , will readjust medications, address sleep issues ,continue to support.  Daily contact with patient to assess and evaluate symptoms and progress in treatment, Medication management and Plan see below   Will continue CIWA/Librium protocol for alcohol withdrawal sx. Will hold Zoloft 50 mg po daily for affective sx due to EKG qtc prolongation. Will continue Neurontin 300 mg po tid for anxiety sx. Will start Lunesta 1 mg po qhs for insomnia. Will not start Seroquel due to qtc prolongation. Will discontinue Trazodone for lack of efficacy. Pt has K+- low, will repeat BMP tonight. Will repeat EKG for qtc prolongation and monitor closely. CSW will work on disposition.  Laken Rog, MD 07/23/2016, 2:27  PM

## 2016-07-23 NOTE — Progress Notes (Signed)
Patient ID: Laura [name], female   DOB: December 05, 1970, 46 y.o.   MRN: [id number]  Pt currently presents with a flat affect and anxious behavior. Pt reports to writer that their goal is to "actually sleep tonight." Pt asks frequently about sleep medication. Pt endorses anxiety. Pt interacts with peers, attends groups.   Pt provided with medications per providers orders. Pt's labs and vitals were monitored throughout the night. Pt given a 1:1 about emotional and mental status. Pt supported and encouraged to express concerns and questions. Pt educated on medications and given patient handout on new sleep medication. Writer offered to speak with patient about concerns. Provider notified of patients complaints of insomnia.   Pt's safety ensured with 15 minute and environmental checks. Pt currently denies SI/HI and A/V hallucinations. Pt verbally agrees to seek staff if SI/HI or A/VH occurs and to consult with staff before acting on any harmful thoughts. Pt affect is inconsistent with mood. Will continue POC.

## 2016-07-23 NOTE — Progress Notes (Signed)
1:1 Note Pt is the room with staff next to him, pt stated he was not going to eat until his family gets here. Pt encouraged to drink fluids. Pt maintained on 1:1 for safety, will continue to monitor.

## 2016-07-23 NOTE — Tx Team (Signed)
Interdisciplinary Treatment and Diagnostic Plan Update  07/23/2016 Time of Session: [place] MRN: [id number]  Principal Diagnosis: Alcohol-induced mood disorder ([place])  Secondary Diagnoses: Principal Problem:   Alcohol-induced mood disorder ([place]) Active Problems:   Alcohol abuse   MDD (major depressive disorder), recurrent severe, without psychosis ([place])   Current Medications:  Current Facility-Administered Medications  Medication Dose Route Frequency Provider Last Rate Last Dose  . acetaminophen (TYLENOL) tablet 650 mg  650 mg Oral Q6H PRN Lindon Romp A, NP   650 mg at 07/21/16 1612  . alum & mag hydroxide-simeth (MAALOX/MYLANTA) 200-200-20 MG/5ML suspension 30 mL  30 mL Oral Q4H PRN Lindon Romp A, NP      . chlordiazePOXIDE (LIBRIUM) capsule 25 mg  25 mg Oral Q6H PRN Withrow, Elyse Jarvis, FNP      . chlordiazePOXIDE (LIBRIUM) capsule 25 mg  25 mg Oral BH-qamhs Withrow, Elyse Jarvis, FNP       Followed by  . [START ON 07/25/2016] chlordiazePOXIDE (LIBRIUM) capsule 25 mg  25 mg Oral Daily Withrow, Elyse Jarvis, FNP      . feeding supplement (ENSURE ENLIVE) (ENSURE ENLIVE) liquid 237 mL  237 mL Oral Q24H Eappen, Saramma, MD      . gabapentin (NEURONTIN) capsule 300 mg  300 mg Oral TID Kathlee Nations, MD   300 mg at 07/23/16 0802  . hydrOXYzine (ATARAX/VISTARIL) tablet 25 mg  25 mg Oral Q6H PRN Lindon Romp A, NP   25 mg at 07/22/16 2113  . ibuprofen (ADVIL,MOTRIN) tablet 600 mg  600 mg Oral Q8H PRN Lindon Romp A, NP   600 mg at 07/22/16 2113  . loperamide (IMODIUM) capsule 2-4 mg  2-4 mg Oral PRN Lindon Romp A, NP   4 mg at 07/21/16 0906  . magnesium hydroxide (MILK OF MAGNESIA) suspension 30 mL  30 mL Oral Daily PRN Lindon Romp A, NP      . multivitamin with minerals tablet 1 tablet  1 tablet Oral Daily Lindon Romp A, NP   1 tablet at 07/23/16 0802  . nicotine (NICODERM CQ - dosed in mg/24 hours) patch 21 mg  21 mg Transdermal Daily Cobos, Myer Peer, MD   21 mg at 07/23/16 0805  .  ondansetron (ZOFRAN-ODT) disintegrating tablet 4 mg  4 mg Oral Q6H PRN Lindon Romp A, NP   4 mg at 07/21/16 1056  . sertraline (ZOLOFT) tablet 50 mg  50 mg Oral Daily Arfeen, Arlyce Harman, MD   50 mg at 07/23/16 0802  . thiamine (VITAMIN B-1) tablet 100 mg  100 mg Oral Daily Benjamine Mola, FNP   100 mg at 07/23/16 0802  . traZODone (DESYREL) tablet 50 mg  50 mg Oral QHS PRN Benjamine Mola, FNP   50 mg at 07/22/16 2113   PTA Medications: Prescriptions Prior to Admission  Medication Sig Dispense Refill Last Dose  . DULoxetine (CYMBALTA) 20 MG capsule Take 1 capsule (20 mg total) by mouth daily. 30 capsule 1 Past Week at Unknown time  . gabapentin (NEURONTIN) 300 MG capsule Start with 1 tab po qhs X 1 week, then increase to 1 tab po bid X 1 week then 1 tab po tid prn 90 capsule 1 Past Week at Unknown time  . hydrOXYzine (ATARAX/VISTARIL) 25 MG tablet Take 1 tablet (25 mg total) by mouth every 12 (twelve) hours as needed for anxiety. 60 tablet 0 did not pick up  . meloxicam (MOBIC) 15 MG tablet Take 1 tablet (15 mg  total) by mouth daily. 30 tablet 1 did not pick up  . methylPREDNISolone (MEDROL DOSEPAK) 4 MG TBPK tablet Take by mouth as directed. Take 6 tablets on the first day prescribed then as directed. (Patient not taking: Reported on 07/20/2016) 21 tablet 0 Completed Course at Unknown time  . QUEtiapine (SEROQUEL) 50 MG tablet Take 1 tablet at bedtime for 2 days, then increase to 2 tablets at bedtime 60 tablet 1 Past Week at Unknown time    Patient Stressors: Health problems Medication change or noncompliance Substance abuse  Patient Strengths: Average or above average intelligence Occupational psychologist fund of knowledge Motivation for treatment/growth Supportive family/friends  Treatment Modalities: Medication Management, Group therapy, Case management,  1 to 1 session with clinician, Psychoeducation, Recreational therapy.   Physician Treatment Plan for Primary  Diagnosis: Alcohol-induced mood disorder ([place]) Long Term Goal(s): Improvement in symptoms so as ready for discharge Improvement in symptoms so as ready for discharge   Short Term Goals: Ability to identify changes in lifestyle to reduce recurrence of condition will improve Ability to verbalize feelings will improve Ability to disclose and discuss suicidal ideas Ability to demonstrate self-control will improve Ability to identify and develop effective coping behaviors will improve Compliance with prescribed medications will improve Ability to identify triggers associated with substance abuse/mental health issues will improve Ability to identify changes in lifestyle to reduce recurrence of condition will improve Ability to verbalize feelings will improve Ability to disclose and discuss suicidal ideas Ability to demonstrate self-control will improve Ability to identify and develop effective coping behaviors will improve Compliance with prescribed medications will improve Ability to identify triggers associated with substance abuse/mental health issues will improve  Medication Management: Evaluate patient's response, side effects, and tolerance of medication regimen.  Therapeutic Interventions: 1 to 1 sessions, Unit Group sessions and Medication administration.  Evaluation of Outcomes: Progressing  Physician Treatment Plan for Secondary Diagnosis: Principal Problem:   Alcohol-induced mood disorder ([place]) Active Problems:   Alcohol abuse   MDD (major depressive disorder), recurrent severe, without psychosis ([place])  Long Term Goal(s): Improvement in symptoms so as ready for discharge Improvement in symptoms so as ready for discharge   Short Term Goals: Ability to identify changes in lifestyle to reduce recurrence of condition will improve Ability to verbalize feelings will improve Ability to disclose and discuss suicidal ideas Ability to demonstrate self-control will improve Ability to  identify and develop effective coping behaviors will improve Compliance with prescribed medications will improve Ability to identify triggers associated with substance abuse/mental health issues will improve Ability to identify changes in lifestyle to reduce recurrence of condition will improve Ability to verbalize feelings will improve Ability to disclose and discuss suicidal ideas Ability to demonstrate self-control will improve Ability to identify and develop effective coping behaviors will improve Compliance with prescribed medications will improve Ability to identify triggers associated with substance abuse/mental health issues will improve     Medication Management: Evaluate patient's response, side effects, and tolerance of medication regimen.  Therapeutic Interventions: 1 to 1 sessions, Unit Group sessions and Medication administration.  Evaluation of Outcomes: Progressing   RN Treatment Plan for Primary Diagnosis: Alcohol-induced mood disorder ([place]) Long Term Goal(s): Knowledge of disease and therapeutic regimen to maintain health will improve  Short Term Goals: Ability to remain free from injury will improve, Ability to verbalize feelings will improve and Ability to disclose and discuss suicidal ideas  Medication Management: RN will administer medications as ordered by provider, will assess and evaluate  patient's response and provide education to patient for prescribed medication. RN will report any adverse and/or side effects to prescribing provider.  Therapeutic Interventions: 1 on 1 counseling sessions, Psychoeducation, Medication administration, Evaluate responses to treatment, Monitor vital signs and CBGs as ordered, Perform/monitor CIWA, COWS, AIMS and Fall Risk screenings as ordered, Perform wound care treatments as ordered.  Evaluation of Outcomes: Progressing   LCSW Treatment Plan for Primary Diagnosis: Alcohol-induced mood disorder ([place]) Long Term Goal(s): Safe  transition to appropriate next level of care at discharge, Engage patient in therapeutic group addressing interpersonal concerns.  Short Term Goals: Engage patient in aftercare planning with referrals and resources, Facilitate patient progression through stages of change regarding substance use diagnoses and concerns and Identify triggers associated with mental health/substance abuse issues  Therapeutic Interventions: Assess for all discharge needs, 1 to 1 time with Social worker, Explore available resources and support systems, Assess for adequacy in community support network, Educate family and significant other(s) on suicide prevention, Complete Psychosocial Assessment, Interpersonal group therapy.  Evaluation of Outcomes: Progressing   Progress in Treatment: Attending groups: No. New to unit. Continuing to assess.  Participating in groups: No. Taking medication as prescribed: Yes. Toleration medication: Yes. Family/Significant other contact made: Pt declined to consent to family contact. SPE completed with pt.  Patient understands diagnosis: Yes. Discussing patient identified problems/goals with staff: Yes. Medical problems stabilized or resolved: Yes. Denies suicidal/homicidal ideation: Yes. Issues/concerns per patient self-inventory: No. Other: n/a   New problem(s) identified: No, Describe:  n/a  New Short Term/Long Term Goal(s): detox; elimination of SI thoughts; Detox; development of comprehensive mental wellness/sobriety plan.   Discharge Plan or Barriers: CSW assessing for appropriate referrals. Currently lives with her foster father. No identified community supports or providers. Pt recently moved from [place] to [place].   Reason for Continuation of Hospitalization: Depression Medication stabilization Suicidal ideation Withdrawal symptoms  Estimated Length of Stay: 3-5 days   Attendees: Patient: 07/23/2016 8:39 AM  Physician: Dr. Shea Evans MD 07/23/2016 8:39 AM  Nursing: Chestine Spore RN 07/23/2016 8:39 AM  RN Care Manager: Lars Pinks CM 07/23/2016 8:39 AM  Social Worker: Maxie Better, [place] 07/23/2016 8:39 AM  Recreational Therapist: Rhunette Croft 07/23/2016 8:39 AM  Other: Lindell Spar NP; Samuel Jester NP 07/23/2016 8:39 AM  Other:  07/23/2016 8:39 AM  Other: 07/23/2016 8:39 AM    Scribe for Treatment Team: [place], LCSW 07/23/2016 8:39 AM

## 2016-07-23 NOTE — Progress Notes (Cosign Needed)
DAR NOTE: Patient presents with anxious affect and depressed mood. Pt has been in the day play cards with peers. Pt complained of neck pain, auditory and visual hallucinations.  Rates depression at 8, hopelessness at 6, and anxiety at 9.  Maintained on routine safety checks.  Medications given as prescribed.  Support and encouragement offered as needed.  Attended group and participated.  Patient observed socializing with peers in the dayroom.  Offered no complaint.

## 2016-07-23 NOTE — Progress Notes (Signed)
Recreation Therapy Notes  INPATIENT RECREATION THERAPY ASSESSMENT  Patient Details Name: Laura [name] MRN: [id number] DOB: 01/19/1971 Today's Date: 07/23/2016  Patient Stressors: Relationship, Work, Other (Comment) (Just moved here, car broke down)  Pt stated she was here for alcohol, depression and anxiety.  Coping Skills:   Substance Abuse, Avoidance, Self-Injury, Art/Dance, Talking, Music  Personal Challenges: Anger, Decision-Making, Problem-Solving, Relationships, Self-Esteem/Confidence, Stress Management, Trusting Others  Leisure Interests (2+):  Music - Listen, Individual - Other (Comment) (Breathing exercises)  Awareness of Community Resources:  No  Patient Strengths:  Personality, kids  Patient Identified Areas of Improvement:  Depression, anxiety  Current Recreation Participation:  Everyday  Patient Goal for Hospitalization:  "Snap out of depression, stop drinking, make new friends"  [place] of Residence:  [place] of Residence:  [place]  Current [place] (including self-harm):  No  Current HI:  No  Consent to Intern Participation: N/A   Victorino Sparrow, LRT/CTRS  Ria Comment, [name] A 07/23/2016, 2:16 PM

## 2016-07-23 NOTE — Progress Notes (Signed)
Recreation Therapy Notes  Date: 07/23/16 Time: 1000 Location: [address] Dayroom  Group Topic: Coping Skills  Goal Area(s) Addresses:  Patients will be able to identify positive coping skills. Patients will be able the benefits of positive coping skills. Patients will be able to explain the benefits of using coping skills post d/c.  Intervention: Interior and spatial designer, pencils  Activity: Building surveyor.  Patients were to picture themselves as being stuck in the center of the web.  Patients were to then write the things they feel have gotten them "stuck" inside the web design.  Patients were to then come up with at least two coping skills for each stressor they identified.   Education: Radiographer, therapeutic, Dentist.   Education Outcome: Acknowledges understanding/In group clarification offered/Needs additional education.   Clinical Observations/Feedback: Pt did not attend group.   Victorino Sparrow, LRT/CTRS         Ria Comment, Simora Dingee A 07/23/2016 1:02 PM

## 2016-07-24 LAB — HEMOGLOBIN A1C
Hgb A1c MFr Bld: 4.9 % (ref 4.8–5.6)
Mean Plasma Glucose: 94 mg/dL

## 2016-07-24 MED ORDER — FLUOXETINE HCL 20 MG PO CAPS
20.0000 mg | ORAL_CAPSULE | Freq: Every day | ORAL | Status: DC
  Administered 2016-07-24 – 2016-07-25 (×2): 20 mg via ORAL
  Filled 2016-07-24 (×3): qty 1
  Filled 2016-07-24: qty 7
  Filled 2016-07-24: qty 1

## 2016-07-24 MED ORDER — TRAZODONE HCL 50 MG PO TABS: 50.0000 mg | ORAL_TABLET | Freq: Every evening | ORAL | Status: DC | PRN

## 2016-07-24 MED ORDER — LIDOCAINE 5 % EX PTCH
1.0000 | MEDICATED_PATCH | Freq: Every day | CUTANEOUS | Status: DC
Start: 2016-07-24 — End: 2016-07-25
  Administered 2016-07-24: 1 via TRANSDERMAL
  Filled 2016-07-24 (×3): qty 1
  Filled 2016-07-24: qty 7

## 2016-07-24 MED ORDER — ESZOPICLONE 2 MG PO TABS
2.0000 mg | ORAL_TABLET | Freq: Every day | ORAL | Status: DC
  Administered 2016-07-24: 2 mg via ORAL
  Filled 2016-07-24: qty 1

## 2016-07-24 NOTE — Progress Notes (Signed)
Recreation Therapy Notes  Date: 07/24/16 Time: 1000 Location: 300 Hall Dayroom  Group Topic: Self-Esteem  Goal Area(s) Addresses:  Patient will identify positive ways to increase self-esteem. Patient will verbalize benefit of increased self-esteem.  Behavioral Response: Engaged  Intervention: Visual merchandiser, markers  Activity: Personalized Plates.  Patients were to design personalized license plates that highlights their uniqueness.  Patients could show important dates, important events, things that are special to them or hidden talents they may have.  Education:  Self-Esteem, Dentist.   Education Outcome: Acknowledges education/In group clarification offered/Needs additional education  Clinical Observations/Feedback: Pt expressed she was from [place], has two children, likes to dance, listen to music, likes the [place], walks in the park and wants a faithful boyfriend.  Pt stated the activity was easy because "I just highlighted things that mean the most to me".  Pt became tearful because she had just spoken to her son.  Pt was also tearful explaining she had recently moved down here and doesn't know anyone.   Victorino Sparrow, LRT/CTRS         Victorino Sparrow A 07/24/2016 11:27 AM

## 2016-07-24 NOTE — Progress Notes (Signed)
Patient ID: Laura [name], female   DOB: [birth year], 46 y.o.   MRN: [id number] D: Client in bed most of this shift, but up for snacks and medications. Client reports goal "detox from alcohol" depression "4" of 10. "I moved here from [place] my fiance was suppose to come, but that didn't happen, so I'm with family" A: Probation officer provided emotional support, encouraged client to move forward with recovery. Medications reviewed, administered as ordered. Staff will monitor q70min for safety. R:Client safe on the unit, did not attend group.

## 2016-07-24 NOTE — BHH Group Notes (Signed)
The focus of this group is to educate the patient on the purpose and policies of crisis stabilization and provide a format to answer questions about their admission.  The group details unit policies and expectations of patients while admitted.  Patient did not attend 0900 nurse education orientation group this morning.  Patient stayed in room.  

## 2016-07-24 NOTE — Progress Notes (Signed)
D:Pt reports anxiety and no other detox symptoms this morning. She c/o neck pain and says that she is improving wanting to leave soon. A:Offered support, encouragement and 15 minute checks.  R:Pt denies si and hi. Safety maintained on the unit.

## 2016-07-24 NOTE — Plan of Care (Signed)
Problem: Safety: Goal: Ability to disclose and discuss suicidal ideas will improve Outcome: Progressing Pt denies si thoughts   Problem: Physical Regulation: Goal: Complications related to the disease process, condition or treatment will be avoided or minimized Outcome: Progressing Pt's CIWA rated a 2

## 2016-07-24 NOTE — BHH Group Notes (Signed)
[place] LCSW Group Therapy  07/24/2016 , 12:52 PM   Type of Therapy:  Group Therapy  Participation Level:  Active  Participation Quality:  Attentive  Affect:  Appropriate  Cognitive:  Alert  Insight:  Improving  Engagement in Therapy:  Engaged  Modes of Intervention:  Discussion, Exploration and Socialization  Summary of Progress/Problems: Today's group focused on the term Diagnosis.  Participants were asked to define the term, and then pronounce whether it is a negative, positive or neutral term. Stayed the entire time, engaged throughout.  "I feel isolated and alone since moving here, And now my car broke down.  That makes it even harder to meet others."  Identified AA as a positive place to meet others, and stated she already has a list of meetings-just needs to follow through with going.  Roque Lias B 07/24/2016 , 12:52 PM

## 2016-07-24 NOTE — Progress Notes (Signed)
[facility] MD Progress Note  07/24/2016 2:59 PM Evette Diclemente  MRN:  [id number] Subjective:  Pt states " I still have anxiety , but I slept a bit better last night."   Objective:Patient seen and chart reviewed.Discussed patient with treatment team.  Pt today seen as anxious ,but is improving. Pt continues to have sleep issues , feels Johnnye Sima is working. But she is OK with increasing the dose. Pt concerned about her EKG - reviewed it with her . Pt reports she does not want to be on zoloft anymore , since she suddenly remembered that she did not respond to it in the past. She has heard good things about prozac and wonders whether she may benefit from the same. Continue to support.    Principal Problem: MDD (major depressive disorder), recurrent severe, without psychosis ([place]) Diagnosis:   Patient Active Problem List   Diagnosis Date Noted  . Alcohol use disorder, moderate, dependence ([place]) [F10.20] 07/23/2016  . 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 Hypokalemia [E87.6] 07/23/2016  . Prolonged Q-T interval on ECG [R94.31] 07/23/2016  . MDD (major depressive disorder), recurrent severe, without psychosis ([place]) [F33.2] 07/21/2016  . Osteoarthritis of spine with radiculopathy, cervical region [facility] 07/02/2016  . Substance abuse [F19.10] 07/02/2016  . Lumbar spondylosis [M47.816] 07/02/2016   Total Time spent with patient: 25 minutes  Past Psychiatric History: Reviewed.  Past Medical History:  Past Medical History:  Diagnosis Date  . Anxiety   . Substance abuse     Past Surgical History:  Procedure Laterality Date  . BACK SURGERY    . KNEE SURGERY     Family History:  Family History  Problem Relation Age of Onset  . Family history unknown: Yes   Family Psychiatric  History: Reviewed. Social History:  History  Alcohol Use  . Yes    Comment: 3-4 pints a day     History  Drug Use No   Comment: denies     Social History   Social History  . Marital status: Single    Spouse name: N/A  . Number of children: N/A  . Years of education: N/A   Social History Main Topics  . Smoking status: Current Every Day Smoker    Packs/day: 1.00  . Smokeless tobacco: Never Used  . Alcohol use Yes     Comment: 3-4 pints a day  . Drug use: No     Comment: denies   . Sexual activity: No   Other Topics Concern  . None   Social History Narrative  . None   Additional Social History:                         Sleep: improving  Appetite:  Fair  Current Medications: Current Facility-Administered Medications  Medication Dose Route Frequency Provider Last Rate Last Dose  . acetaminophen (TYLENOL) tablet 650 mg  650 mg Oral Q6H PRN Lindon Romp A, NP   650 mg at 07/23/16 1510  . alum & mag hydroxide-simeth (MAALOX/MYLANTA) 200-200-20 MG/5ML suspension 30 mL  30 mL Oral Q4H PRN Rozetta Nunnery, NP      . Derrill Memo ON 07/25/2016] chlordiazePOXIDE (LIBRIUM) capsule 25 mg  25 mg Oral Daily Withrow, John C, FNP      . eszopiclone (LUNESTA) tablet 2 mg  2 mg Oral QHS Kayd Launer, MD      . feeding supplement (ENSURE ENLIVE) (ENSURE ENLIVE) liquid 237  mL  237 mL Oral Q24H Valeriano Bain, Ria Clock, MD   237 mL at 07/24/16 0925  . FLUoxetine (PROZAC) capsule 20 mg  20 mg Oral Daily Ursula Alert, MD   20 mg at 07/24/16 0924  . gabapentin (NEURONTIN) capsule 300 mg  300 mg Oral TID Berniece Andreas T, MD   300 mg at 07/24/16 1215  . ibuprofen (ADVIL,MOTRIN) tablet 600 mg  600 mg Oral Q8H PRN Lindon Romp A, NP   600 mg at 07/23/16 1938  . magnesium hydroxide (MILK OF MAGNESIA) suspension 30 mL  30 mL Oral Daily PRN Lindon Romp A, NP      . methocarbamol (ROBAXIN) tablet 500 mg  500 mg Oral TID Ursula Alert, MD   500 mg at 07/24/16 1215  . multivitamin with minerals tablet 1 tablet  1 tablet Oral Daily Lindon Romp A, NP   1 tablet at 07/24/16 0813  . nicotine (NICODERM CQ - dosed in mg/24  hours) patch 21 mg  21 mg Transdermal Daily Cobos, Myer Peer, MD   21 mg at 07/24/16 0814  . thiamine (VITAMIN B-1) tablet 100 mg  100 mg Oral Daily Benjamine Mola, FNP   100 mg at 07/24/16 0813  . traZODone (DESYREL) tablet 50 mg  50 mg Oral QHS,MR X 1 Laverle Hobby, PA-C   50 mg at 07/23/16 2312    Lab Results:  Results for orders placed or performed during the hospital encounter of 07/21/16 (from the past 48 hour(s))  Hemoglobin A1c     Status: None   Collection Time: 07/23/16  6:07 AM  Result Value Ref Range   Hgb A1c MFr Bld 4.9 4.8 - 5.6 %    Comment: (NOTE)         Pre-diabetes: 5.7 - 6.4         Diabetes: >6.4         Glycemic control for adults with diabetes: <7.0    Mean Plasma Glucose 94 mg/dL    Comment: (NOTE) Performed At: [facility] [place], [place] [id number] Lindon Romp MD LH:[id number] Performed at [facility], [place] [address], [place], [place] [address]   Lipid panel     Status: Abnormal   Collection Time: 07/23/16  6:07 AM  Result Value Ref Range   Cholesterol 239 (H) 0 - 200 mg/dL   Triglycerides 95 <150 mg/dL   HDL 87 >40 mg/dL   Total CHOL/HDL Ratio 2.7 RATIO   VLDL 19 0 - 40 mg/dL   LDL Cholesterol 133 (H) 0 - 99 mg/dL    Comment:        Total Cholesterol/HDL:CHD Risk Coronary Heart Disease Risk Table                     Men   Women  1/2 Average Risk   3.4   3.3  Average Risk       5.0   4.4  2 X Average Risk   9.6   7.1  3 X Average Risk  23.4   11.0        Use the calculated Patient Ratio above and the CHD Risk Table to determine the patient's CHD Risk.        ATP III CLASSIFICATION (LDL):  <100     mg/dL   Optimal  100-129  mg/dL   Near or Above  Optimal  130-159  mg/dL   Borderline  160-189  mg/dL   High  >190     mg/dL   Very High Performed at [facility] [address], [place], [place] [address]   Basic metabolic panel     Status: Abnormal   Collection  Time: 07/23/16  6:14 PM  Result Value Ref Range   Sodium 141 135 - 145 mmol/L   Potassium 3.9 3.5 - 5.1 mmol/L   Chloride 107 101 - 111 mmol/L   CO2 27 22 - 32 mmol/L   Glucose, Bld 137 (H) 65 - 99 mg/dL   BUN 11 6 - 20 mg/dL   Creatinine, Ser 0.66 0.44 - 1.00 mg/dL   Calcium 9.3 8.9 - 10.3 mg/dL   GFR calc non Af Amer >60 >60 mL/min   GFR calc Af Amer >60 >60 mL/min    Comment: (NOTE) The eGFR has been calculated using the CKD EPI equation. This calculation has not been validated in all clinical situations. eGFR's persistently <60 mL/min signify possible Chronic Kidney Disease.    Anion gap 7 5 - 15    Comment: Performed at [facility], [place] [address] [place], [place] [address]    Blood Alcohol level:  Lab Results  Component Value Date   ETH [facility]) 03/50/0938    Metabolic Disorder Labs: Lab Results  Component Value Date   HGBA1C 4.9 07/23/2016   MPG 94 07/23/2016   No results found for: PROLACTIN Lab Results  Component Value Date   CHOL 239 (H) 07/23/2016   TRIG 95 07/23/2016   HDL 87 07/23/2016   CHOLHDL 2.7 07/23/2016   VLDL 19 07/23/2016   LDLCALC 133 (H) 07/23/2016    Physical Findings: AIMS: Facial and Oral Movements Muscles of Facial Expression: None, normal Lips and Perioral Area: None, normal Jaw: None, normal Tongue: None, normal,Extremity Movements Upper (arms, wrists, hands, fingers): None, normal Lower (legs, knees, ankles, toes): None, normal, Trunk Movements Neck, shoulders, hips: None, normal, Overall Severity Severity of abnormal movements (highest score from questions above): None, normal Incapacitation due to abnormal movements: None, normal Patient's awareness of abnormal movements (rate only patient's report): No Awareness, Dental Status Current problems with teeth and/or dentures?: No Does patient usually wear dentures?: No  CIWA:  CIWA-Ar Total: 1 COWS:     Musculoskeletal: Strength & Muscle Tone: within  normal limits Gait & Station: normal Patient leans: N/A  Psychiatric Specialty Exam: Physical Exam  Nursing note and vitals reviewed.   Review of Systems  Musculoskeletal: Positive for back pain and neck pain.  Neurological: Negative.   Psychiatric/Behavioral: Positive for depression and substance abuse. The patient is nervous/anxious and has insomnia (improving).   All other systems reviewed and are negative.   Blood pressure 129/89, pulse 96, temperature 97.5 F (36.4 C), temperature source Oral, resp. rate 18, height 5' 6.5" (1.689 m), weight 73.5 kg (162 lb), SpO2 100 %.Body mass index is 25.76 kg/m.  General Appearance: Casual  Eye Contact:  Fair  Speech:  Normal Rate  Volume:  Normal  Mood:  Anxious and Depressed, improving  Affect:  Congruent  Thought Process:  Goal Directed and Descriptions of Associations: Circumstantial  Orientation:  Full (Time, Place, and Person)  Thought Content:  Logical  Suicidal Thoughts:  No  Homicidal Thoughts:  No  Memory:  Immediate;   Fair Recent;   Fair Remote;   Fair  Judgement:  Fair  Insight:  Fair  Psychomotor Activity:  Normal  Concentration:  Concentration: Fair and Attention Span: Fair  Recall:  [employer] of Knowledge:  Fair  Language:  Fair  Akathisia:  No  Handed:  Right  AIMS (if indicated):     Assets:  Communication Skills Desire for Improvement Housing Social Support  ADL's:  Intact  Cognition:  WNL  Sleep:  Number of Hours: 5.5     MDD (major depressive disorder), recurrent severe, without psychosis ([place]) improving  Will continue today 07/24/16  plan as below except where it is noted.        Treatment Plan Summary:Pt presented with depression, SI as well as alcoholism , somatic sx like pain , currently she is making partial progress , her zoloft was held yesterday along with other psychotropics due to qtc prolongation. Repeat EKG shows qtc as wnl . Discussed restarting zoloft , but she wants to be on  prozac. Will continue to treat.  Daily contact with patient to assess and evaluate symptoms and progress in treatment, Medication management and Plan see below   Will continue CIWA/Librium protocol for alcohol withdrawal sx. Will discontinue Zoloft for lack of efficacy , start Prozac 20 mg po daily for affective sx. Will continue Neurontin 300 mg po tid for anxiety sx. Will increase Lunesta to 2 mg po qhs for insomnia. Labs reviewed - hypokalemia resolved , K+ wnl . Repeat EKG shows - qtc - wnl .Normal sinus. Referral to substance abuse program. CSW will continue to work on disposition.  Chang Tiggs, MD 07/24/2016, 2:59 PM

## 2016-07-24 NOTE — Progress Notes (Signed)
Recreation Therapy Notes  Animal-Assisted Activity (AAA) Program Checklist/Progress Notes Patient Eligibility Criteria Checklist & Daily Group note for Rec TxIntervention  Date: 05.08.2018 Time: 2:45pm Location: [address]   AAA/T Program Assumption of Risk Form signed by Patient/ or Parent Legal Guardian Yes  Patient is free of allergies or sever asthma Yes  Patient reports no fear of animals Yes  Patient reports no history of cruelty to animals Yes  Patient understands his/her participation is voluntary Yes  Patient washes hands before animal contact Yes  Patient washes hands after animal contact Yes  Behavioral Response: Engaged, Attentive   Education:Hand Washing, Appropriate Animal Interaction   Education Outcome: Acknowledges education.   Clinical Observations/Feedback: Patient attended session and interacted appropriately with therapy dog and peers.   Laura [name], LRT/CTRS        Laura [name] 07/24/2016 3:03 PM

## 2016-07-25 ENCOUNTER — Encounter (HOSPITAL_COMMUNITY): Payer: Self-pay | Admitting: Psychiatry

## 2016-07-25 MED ORDER — HYDROXYZINE HCL 50 MG PO TABS: 100.0000 mg | ORAL_TABLET | Freq: Every evening | ORAL | 0 refills | Status: DC | PRN

## 2016-07-25 MED ORDER — FLUOXETINE HCL 20 MG PO CAPS: 20.0000 mg | ORAL_CAPSULE | Freq: Every day | ORAL | 0 refills | Status: DC

## 2016-07-25 MED ORDER — GABAPENTIN 300 MG PO CAPS: 300.0000 mg | ORAL_CAPSULE | Freq: Three times a day (TID) | ORAL | 0 refills | Status: DC

## 2016-07-25 MED ORDER — LIDOCAINE 5 % EX PTCH: 1.0000 | MEDICATED_PATCH | Freq: Every day | CUTANEOUS | 0 refills | Status: DC

## 2016-07-25 MED ORDER — ESZOPICLONE 2 MG PO TABS: 2.0000 mg | ORAL_TABLET | Freq: Every day | ORAL | 0 refills | Status: DC

## 2016-07-25 MED ORDER — NICOTINE 21 MG/24HR TD PT24: 21.0000 mg | MEDICATED_PATCH | Freq: Every day | TRANSDERMAL | 0 refills | Status: DC

## 2016-07-25 MED ORDER — METHOCARBAMOL 500 MG PO TABS: 500.0000 mg | ORAL_TABLET | Freq: Three times a day (TID) | ORAL | 0 refills | Status: DC

## 2016-07-25 MED ORDER — HYDROXYZINE HCL 50 MG PO TABS
100.0000 mg | ORAL_TABLET | Freq: Every evening | ORAL | Status: DC | PRN
  Filled 2016-07-25: qty 14

## 2016-07-25 NOTE — Tx Team (Signed)
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 acetaminophen (TYLENOL) tablet 650 mg  650 mg Oral Q6H PRN Lindon Romp A, NP   650 mg at 07/23/16 1510  . alum & mag hydroxide-simeth (MAALOX/MYLANTA) 200-200-20 MG/5ML suspension 30 mL  30 mL Oral Q4H PRN Lindon Romp A, NP      . eszopiclone (LUNESTA) tablet 2 mg  2 mg Oral QHS Ursula Alert, MD   2 mg at 07/24/16 2120  . feeding supplement (ENSURE ENLIVE) (ENSURE ENLIVE) liquid 237 mL  237 mL Oral Q24H Eappen, Saramma, MD   237 mL at 07/24/16 0925  . FLUoxetine (PROZAC) capsule 20 mg  20 mg Oral Daily Eappen, Saramma, MD   20 mg at 07/25/16 0820  . gabapentin (NEURONTIN) capsule 300 mg  300 mg Oral TID Berniece Andreas T, MD   300 mg at 07/25/16 0820  . hydrOXYzine (ATARAX/VISTARIL) tablet 100 mg  100 mg Oral QHS PRN Eappen, Ria Clock, MD      . ibuprofen (ADVIL,MOTRIN) tablet 600 mg  600 mg Oral Q8H PRN Lindon Romp A, NP   600 mg at 07/24/16 1559  . lidocaine (LIDODERM) 5 % 1 patch  1 patch Transdermal Daily Ursula Alert, MD   1 patch at 07/24/16 1557  . magnesium hydroxide (MILK OF MAGNESIA) suspension 30 mL  30 mL Oral Daily PRN Lindon Romp A, NP      . methocarbamol (ROBAXIN) tablet 500 mg  500 mg Oral TID Ursula Alert, MD   500 mg at 07/25/16 0820  . multivitamin with minerals tablet 1 tablet  1 tablet Oral Daily  Lindon Romp A, NP   1 tablet at 07/25/16 0820  . nicotine (NICODERM CQ - dosed in mg/24 hours) patch 21 mg  21 mg Transdermal Daily Cobos, Myer Peer, MD   21 mg at 07/25/16 0825  . thiamine (VITAMIN B-1) tablet 100 mg  100 mg Oral Daily Withrow, Elyse Jarvis, FNP   100 mg at 07/25/16 0820   PTA Medications: Prescriptions Prior to Admission  Medication Sig Dispense Refill Last Dose  . DULoxetine (CYMBALTA) 20 MG capsule Take 1 capsule (20 mg total) by mouth daily. 30 capsule 1 Past Week at Unknown time  . gabapentin (NEURONTIN) 300 MG capsule Start with 1 tab po qhs X 1 week, then increase to 1 tab po bid X 1 week then 1 tab po tid prn 90 capsule 1 Past Week at Unknown time  . hydrOXYzine (ATARAX/VISTARIL) 25 MG tablet Take 1 tablet (25 mg total) by mouth every 12 (twelve) hours as needed for anxiety. 60 tablet 0 did not pick up  . meloxicam (MOBIC) 15 MG tablet Take 1 tablet (15 mg total) by mouth daily. 30 tablet 1 did not pick up  . methylPREDNISolone (MEDROL DOSEPAK) 4 MG TBPK tablet Take by mouth as directed. Take 6 tablets on the first day prescribed then as directed. (Patient not taking: Reported on 07/20/2016) 21 tablet 0 Completed Course at Unknown time  .  QUEtiapine (SEROQUEL) 50 MG tablet Take 1 tablet at bedtime for 2 days, then increase to 2 tablets at bedtime 60 tablet 1 Past Week at Unknown time    Patient Stressors: Health problems Medication change or noncompliance Substance abuse  Patient Strengths: Average or above average intelligence Occupational psychologist fund of knowledge Motivation for treatment/growth Supportive family/friends  Treatment Modalities: Medication Management, Group therapy, Case management,  1 to 1 session with clinician, Psychoeducation, Recreational therapy.   Physician Treatment Plan for Primary Diagnosis: MDD (major depressive disorder), recurrent severe, without psychosis ([place]) Long Term Goal(s): Improvement in symptoms so as  ready for discharge Improvement in symptoms so as ready for discharge   Short Term Goals: Ability to identify changes in lifestyle to reduce recurrence of condition will improve Ability to verbalize feelings will improve Ability to disclose and discuss suicidal ideas Ability to demonstrate self-control will improve Ability to identify and develop effective coping behaviors will improve Compliance with prescribed medications will improve Ability to identify triggers associated with substance abuse/mental health issues will improve Ability to identify changes in lifestyle to reduce recurrence of condition will improve Ability to verbalize feelings will improve Ability to disclose and discuss suicidal ideas Ability to demonstrate self-control will improve Ability to identify and develop effective coping behaviors will improve Compliance with prescribed medications will improve Ability to identify triggers associated with substance abuse/mental health issues will improve  Medication Management: Evaluate patient's response, side effects, and tolerance of medication regimen.  Therapeutic Interventions: 1 to 1 sessions, Unit Group sessions and Medication administration.  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 ([place])   Hypokalemia   Prolonged Q-T interval on ECG  Long Term Goal(s): Improvement in symptoms so as ready for discharge Improvement in symptoms so as ready for discharge   Short Term Goals: Ability to identify changes in lifestyle to reduce recurrence of condition will improve Ability to verbalize feelings will improve Ability to disclose and discuss suicidal ideas Ability to demonstrate  self-control will improve Ability to identify and develop effective coping behaviors will improve Compliance with prescribed medications will improve Ability to identify triggers associated with substance abuse/mental health issues will improve Ability to identify changes in lifestyle to reduce recurrence of condition will improve Ability to verbalize feelings will improve Ability to disclose and discuss suicidal ideas Ability to demonstrate self-control will improve Ability to identify and develop effective coping behaviors will improve Compliance with prescribed medications will improve Ability to identify triggers associated with substance abuse/mental health issues will improve     Medication Management: Evaluate patient's response, side effects, and tolerance of medication regimen.  Therapeutic Interventions: 1 to 1 sessions, Unit Group sessions and Medication administration.  Evaluation of Outcomes: Met  RN Treatment Plan for Primary Diagnosis: MDD (major depressive disorder), recurrent severe, without psychosis ([place]) Long Term Goal(s): Knowledge of disease and therapeutic regimen to maintain health will improve  Short Term Goals: Ability to remain free from injury will improve, Ability to verbalize feelings will improve and Ability to disclose and discuss suicidal ideas  Medication Management: RN will administer medications as ordered by provider, will assess and evaluate patient's response and provide education to patient for prescribed medication. RN will report any adverse and/or side effects to prescribing provider.  Therapeutic  Interventions: 1 on 1 counseling sessions, Psychoeducation, Medication administration, Evaluate responses to treatment, Monitor vital signs and CBGs as ordered, Perform/monitor CIWA, COWS, AIMS and Fall Risk screenings as ordered, Perform wound care treatments as ordered.  Evaluation of Outcomes: Met  LCSW Treatment Plan for Primary Diagnosis: MDD  (major depressive disorder), recurrent severe, without psychosis ([place]) Long Term Goal(s): Safe transition to appropriate next level of care at discharge, Engage patient in therapeutic group addressing interpersonal concerns.  Short Term Goals: Engage patient in aftercare planning with referrals and resources, Facilitate patient progression through stages of change regarding substance use diagnoses and concerns and Identify triggers associated with mental health/substance abuse issues  Therapeutic Interventions: Assess for all discharge needs, 1 to 1 time with Social worker, Explore available resources and support systems, Assess for adequacy in community support network, Educate family and significant other(s) on suicide prevention, Complete Psychosocial Assessment, Interpersonal group therapy.  Evaluation of Outcomes: Met  Progress in Treatment: Attending groups: Yes Participating in groups: Yes Taking medication as prescribed: Yes. Toleration medication: Yes. Family/Significant other contact made: Pt declined to consent to family contact. SPE completed with pt.  Patient understands diagnosis: Yes. Discussing patient identified problems/goals with staff: Yes. Medical problems stabilized or resolved: Yes. Denies suicidal/homicidal ideation: Yes. Issues/concerns per patient self-inventory: No. Other: n/a   New problem(s) identified: No, Describe:  n/a  New Short Term/Long Term Goal(s): detox; elimination of SI thoughts; Detox; development of comprehensive mental wellness/sobriety plan.   Discharge Plan or Barriers:  Currently lives with her foster father and plans to return home; pt given boarding house information and low income housing resources in [place]. Pt will follow-[facility] at [facility] until appt scheduled at [facility]. Pt also given [facility] pamphlet for peer support counseling and support groups.   Reason for Continuation of  Hospitalization: none  Estimated Length of Stay: discharge today   Attendees: Patient: 07/25/2016 10:33 AM  Physician: Dr. Sanjuana Letters MD 07/25/2016 10:33 AM  Nursing: Kristen Cardinal RN 07/25/2016 10:33 AM  RN Care Manager: Lars Pinks CM 07/25/2016 10:33 AM  Social Worker: Maxie Better, LCSW 07/25/2016 10:33 AM  Recreational Therapist: Rhunette Croft 07/25/2016 10:33 AM  Other: Lindell Spar NP; Samuel Jester NP 07/25/2016 10:33 AM  Other:  07/25/2016 10:33 AM  Other: 07/25/2016 10:33 AM    Scribe for Treatment Team: [place], LCSW 07/25/2016 10:33 AM

## 2016-07-25 NOTE — Progress Notes (Signed)
Discharge- Patient verbalizes readiness for discharge: Denies SI/HI, is not psychotic or delusional. A- Discharge instructions read and discussed with patient.  All belongings returned to patient. R- Patient cooperative with discharge process. Patient verbalizes understanding of discharge instructions.  Signed for return of belongings. Escorted to the lobby.

## 2016-07-25 NOTE — BHH Suicide Risk Assessment (Signed)
[facility] Discharge Suicide Risk Assessment   Principal Problem: MDD (major depressive disorder), recurrent severe, without psychosis ([place]) Discharge Diagnoses:  Patient Active Problem List   Diagnosis Date Noted  . Alcohol use disorder, moderate, dependence ([place]) [F10.20] 07/23/2016  . 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 Hypokalemia [E87.6] 07/23/2016  . Prolonged Q-T interval on ECG [R94.31] 07/23/2016  . MDD (major depressive disorder), recurrent severe, without psychosis ([place]) [F33.2] 07/21/2016  . Osteoarthritis of spine with radiculopathy, cervical region [facility] 07/02/2016  . Substance abuse [F19.10] 07/02/2016  . Lumbar spondylosis [M47.816] 07/02/2016    Total Time spent with patient: 30 minutes  Musculoskeletal: Strength & Muscle Tone: within normal limits Gait & Station: normal Patient leans: N/A  Psychiatric Specialty Exam: Review of Systems  Psychiatric/Behavioral: Positive for substance abuse. Negative for depression and suicidal ideas.  All other systems reviewed and are negative.   Blood pressure 108/82, pulse 90, temperature 98 F (36.7 C), temperature source Oral, resp. rate 16, height 5' 6.5" (1.689 m), weight 73.5 kg (162 lb), SpO2 100 %.Body mass index is 25.76 kg/m.  General Appearance: Casual  Eye Contact::  Fair  Speech:  Normal Rate409  Volume:  Normal  Mood:  Euthymic  Affect:  Congruent  Thought Process:  Goal Directed and Descriptions of Associations: Intact  Orientation:  Full (Time, Place, and Person)  Thought Content:  Logical  Suicidal Thoughts:  No  Homicidal Thoughts:  No  Memory:  Immediate;   Fair Recent;   Fair Remote;   Fair  Judgement:  Fair  Insight:  Fair  Psychomotor Activity:  Normal  Concentration:  Fair  Recall:  [employer] of Knowledge:Fair  Language: Fair  Akathisia:  No  Handed:  Right  AIMS (if indicated):     Assets:  Communication  Skills Desire for Improvement  Sleep:  Number of Hours: 6.75  Cognition: WNL  ADL's:  Intact   Mental Status Per Nursing Assessment::   On Admission:  NA  Demographic Factors:  Caucasian  Loss Factors: NA  Historical Factors: Impulsivity  Risk Reduction Factors:   Positive social support and Positive therapeutic relationship  Continued Clinical Symptoms:  Alcohol/Substance Abuse/Dependencies Previous Psychiatric Diagnoses and Treatments  Cognitive Features That Contribute To Risk:  None    Suicide Risk:  Minimal: No identifiable suicidal ideation.  Patients presenting with no risk factors but with morbid ruminations; may be classified as minimal risk based on the severity of the depressive symptoms  [facility] Follow up.   Why:  referral made: 07/24/16. Message left requesting appt date/time within 7 days of hospital discharge. Social Worker will contact you directly with this information if not provided by discharge. Thank you.  Contact information: [place] [address] [phone]        [facility] Follow up.   Specialty:  Behavioral Health Why:  If you would like to be seen prior to appointment at [facility], please Walk in between 8am-9am Monday through Friday for assessment (medication management/counseling). Thank you.  Contact information: [place] [address] [phone]           Plan Of Care/Follow-up recommendations:  Activity:  no restrictions Diet:  regular Tests:  as needed Other:  follow up with outpatient provider  Levis Nazir, MD 07/25/2016, 9:24 AM

## 2016-07-25 NOTE — Progress Notes (Signed)
Recreation Therapy Notes  Date: 07/25/16 Time: 1000 Location: [address] Dayroom  Group Topic: Leisure Education, Goal Setting  Goal Area(s) Addresses:  Patient will be able to identify at least 3 life goals.  Patient will be able to identify benefit of investing in life goals.  Patient will be able to identify benefit of setting life goals.   Behavioral Response:  Minimal  Intervention: Goals sheet, pencils  Activity: Life Goals.  Patients were to determine what they do well, where they need to improve and set a goal to make that improvement.  Patients had to set goals in the areas of family, friends, work/school, spirituality, body and mental health.  Education:  Discharge Planning, Radiographer, therapeutic, Leisure Education   Education Outcome: Acknowledges Education/In Group Clarification Provided/Needs Additional Education  Clinical Observations:  Pt completed her sheet but didn't share with the group because she stated she "wasn't in the right head space".   Victorino Sparrow, LRT/CTRS         Victorino Sparrow A 07/25/2016 12:25 PM

## 2016-07-25 NOTE — Discharge Summary (Signed)
Physician Discharge Summary Note  Patient:  Laura [name] is an 46 y.o., female MRN:  [id number] DOB:  10/20/1970 Patient phone:  [phone] (home)  Patient address:   [address] [place] [address],  Total Time spent with patient: Greater than  30 minutes  Date of Admission:  07/21/2016 Date of Discharge: 07-25-16  Reason for Admission: [name] intoxication/suicidal ideations.  Principal Problem: MDD (major depressive disorder), recurrent severe, without psychosis [facility])  Discharge Diagnoses: Patient Active Problem List   Diagnosis Date Noted  . [name] use disorder, moderate, dependence ([place]) [F10.20] 07/23/2016  . 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 Hypokalemia [E87.6] 07/23/2016  . Prolonged Q-T interval on ECG [R94.31] 07/23/2016  . MDD (major depressive disorder), recurrent severe, without psychosis ([place]) [F33.2] 07/21/2016  . Osteoarthritis of spine with radiculopathy, cervical region [facility] 07/02/2016  . Substance abuse [F19.10] 07/02/2016  . Lumbar spondylosis [M47.816] 07/02/2016   Past Psychiatric History: Major depressive disorder, recurrent  Past Medical History:  Past Medical History:  Diagnosis Date  . Anxiety   . Substance abuse     Past Surgical History:  Procedure Laterality Date  . BACK SURGERY    . KNEE SURGERY     Family History:  Family History  Problem Relation Age of Onset  . Family history unknown: Yes   Family Psychiatric  History: See H&P  Social History:  History  [name] Use  . Yes    Comment: 3-4 pints a day     History  Drug Use No    Comment: denies     Social History   Social History  . Marital status: Single    Spouse name: N/A  . Number of children: N/A  . Years of education: N/A   Social History Main Topics  . Smoking status: Current Every Day Smoker    Packs/day: 1.00  . Smokeless tobacco:  Never Used  . [name] use Yes     Comment: 3-4 pints a day  . Drug use: No     Comment: denies   . Sexual activity: No   Other Topics Concern  . None   Social History Narrative  . None   Hospital Course:  Laura [name] a 46 y.o.femalewho presents unaccompanied to [name] reporting symptoms of [name] use and depression, including suicidal ideation. Pt reports is seeking treatment at this time because "I'm tired of the constant cycle of drinking." She reports she is currently drinking 3-4 pints of vodka daily and has been doing so for months. She reports feeling severely depressed and anxious. She reports daily panic attacks. She reports current suicidal ideation with plan to either hang herself or wreck her car. Pt reports two previous suicide attempts, one last month by cutting her wrists and one last week by wrapping a sheet around her neck. Pt denies current homicidal ideation or history of violence. Pt denies history of psychotic symptoms. 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 She says her last use was one month ago. Pt reports she has been drinking heavily and experiences withdrawal symptoms. She reports that her boyfriend broke up with her which has caused her a lot of emotional stress.   Laura [name] to the hospital with her UDS reports negative of all substances, however, Laura [name] was 318 per toxicology tests results. On evaluation at the ED, Laura [name]. She also endorsed worsening depression & suicidal ideations with plans. She blamed her increased [name] use on a broken relationship. She was in need of [name] detoxification as well as mood stabilization treatments.   After evaluation of her presenting symptoms, Laura [name] received Librium detoxification treatment protocols for [name] detox. She was also enrolled & participated in the group counseling sessions and AA/NA meetings being offered and held  on this unit. She learned coping skills. Besides the detoxification treatment, Laura [name] was medicated & discharged on; Gabapentin 300 mg for agitation/substance withdrawal syndrome, Hydroxyzine 50 mg for insomnia, Fluoxetine 20 mg for depression & Nicotine patch for smoking cessation. She received other medication regimen for the other medical issues that she presented. She tolerated her treatment regimen without any adverse effects.  Laura [name] has completed detox treatment and her mood is stable. This is evidenced by her reports of improved mood and absence of [name] withdrawal symptoms. She is currently being discharged to continue Substance abuse/mental health care on an outpatient basis. She is provided with all the necessary information needed to make this appointment without problems. Upon discharge, Laura [name] adamantly denies any SIHI, AVH, delusional thoughts, paranoia and or substance withdrawal symptoms. She left with all belongings in no apparent distress. Transportation per father.  Physical Findings: AIMS: Facial and Oral Movements Muscles of Facial Expression: None, normal Lips and Perioral Area: None, normal Jaw: None, normal Tongue: None, normal,Extremity Movements Upper (arms, wrists, hands, fingers): None, normal Lower (legs, knees, ankles, toes): None, normal, Trunk Movements Neck, shoulders, hips: None, normal, Overall Severity Severity of abnormal movements (highest score from questions above): None, normal Incapacitation due to abnormal movements: None, normal Patient's awareness of abnormal movements (rate only patient's report): No Awareness, Dental Status Current problems with teeth and/or dentures?: No Does patient usually wear dentures?: No  CIWA:  CIWA-Ar Total: 0 COWS:     Musculoskeletal: Strength & Muscle Tone: within normal limits Gait & Station: normal Patient leans: N/A  Psychiatric Specialty Exam: Physical Exam  Constitutional: She appears well-developed.   HENT:  Head: Normocephalic.  Eyes: Pupils are equal, round, and reactive to light.  Neck: Normal range of motion.  Cardiovascular: Normal rate.   Respiratory: Effort normal.  GI: Soft.  Genitourinary:  Genitourinary Comments: Deferred  Musculoskeletal: Normal range of motion.  Neurological: She is alert.  Skin: Skin is warm and dry.    Review of Systems  Constitutional: Negative.   HENT: Negative.   Eyes: Negative.   Respiratory: Negative.   Cardiovascular: Negative.   Gastrointestinal: Negative.   Genitourinary: Negative.   Musculoskeletal: Negative.   Skin: Negative.   Neurological: Negative.   Endo/Heme/Allergies: Negative.   Psychiatric/Behavioral: Positive for depression (Stable). Negative for hallucinations, substance abuse and suicidal ideas. The patient has insomnia (Stable). The patient is not nervous/anxious.     Blood pressure 108/82, pulse 90, temperature 98 F (36.7 C), temperature source Oral, resp. rate 16, height 5' 6.5" (1.689 m), weight 73.5 kg (162 lb), SpO2 100 %.Body mass index is 25.76 kg/m.  See Md's SRA   Have you used any form of tobacco in the last 30 days? (Cigarettes, Smokeless Tobacco, Cigars, and/or Pipes): Yes  Has this patient used any form of tobacco in the last 30 days? (Cigarettes, Smokeless Tobacco, Cigars, and/or Pipes):Yes, provided with Nicotine patch prescription for smoking cessation.  Blood [name] level:  Lab Results  Component Value Date   ETH 318 [facility]) 45/80/9983   Metabolic Disorder Labs:  Lab Results  Component Value Date  HGBA1C 4.9 07/23/2016   MPG 94 07/23/2016   No results found for: PROLACTIN Lab Results  Component Value Date   CHOL 239 (H) 07/23/2016   TRIG 95 07/23/2016   HDL 87 07/23/2016   CHOLHDL 2.7 07/23/2016   VLDL 19 07/23/2016   LDLCALC 133 (H) 07/23/2016   See Psychiatric Specialty Exam and Suicide Risk Assessment completed by Attending Physician prior to discharge.  Discharge destination:   Home  Is patient on multiple antipsychotic therapies at discharge:  No   Has Patient had three or more failed trials of antipsychotic monotherapy by history:  No  Recommended Plan for Multiple Antipsychotic Therapies: NA  Allergies as of 07/25/2016   No Known Allergies     Medication List    STOP taking these medications   DULoxetine 20 MG capsule Commonly known as:  CYMBALTA   meloxicam 15 MG tablet Commonly known as:  MOBIC   methylPREDNISolone 4 MG Tbpk tablet Commonly known as:  MEDROL DOSEPAK   QUEtiapine 50 MG tablet Commonly known as:  SEROQUEL     TAKE these medications     Indication  eszopiclone 2 MG Tabs tablet Commonly known as:  LUNESTA Take 1 tablet (2 mg total) by mouth at bedtime. (Take immediately before bedtime): For sleep  Indication:  Trouble Sleeping   FLUoxetine 20 MG capsule Commonly known as:  PROZAC Take 1 capsule (20 mg total) by mouth daily. For depression Start taking on:  07/26/2016  Indication:  Major Depressive Disorder   gabapentin 300 MG capsule Commonly known as:  NEURONTIN Take 1 capsule (300 mg total) by mouth 3 (three) times daily. For agitation What changed:  how much to take  how to take this  when to take this  additional instructions  Indication:  Agitation   hydrOXYzine 50 MG tablet Commonly known as:  ATARAX/VISTARIL Take 2 tablets (100 mg total) by mouth at bedtime as needed (sleep, please do not combine with lunesta). What changed:  medication strength  how much to take  when to take this  reasons to take this  Indication:  Tension, Insomnia   lidocaine 5 % Commonly known as:  LIDODERM Place 1 patch onto the skin daily. Remove & Discard patch within 12 hours or as directed by MD: For pain management Start taking on:  07/26/2016  Indication:  Pain   methocarbamol 500 MG tablet Commonly known as:  ROBAXIN Take 1 tablet (500 mg total) by mouth 3 (three) times daily. For muscle spasms  Indication:   Musculoskeletal Pain   nicotine 21 mg/24hr patch Commonly known as:  NICODERM CQ - dosed in mg/24 hours Place 1 patch (21 mg total) onto the skin daily. For smoking cessation Start taking on:  07/26/2016  Indication:  Nicotine Addiction      [place] Follow up.   Why:  referral made: 07/24/16. Message left requesting appt date/time within 7 days of hospital discharge. Social Worker will contact you directly with this information if not provided by discharge. Thank you.  Contact information: [place] [address] [phone]        [facility] Follow up.   Specialty:  Behavioral Health Why:  If you would like to be seen prior to appointment at [place], please Walk in between 8am-9am Monday through Friday for assessment (medication management/counseling). Thank you.  Contact information: [place]  [address] [phone]  Follow-up recommendations: Activity:  As tolerated Diet: As recommended by your primary care doctor. Keep all scheduled follow-up appointments as recommended.   Comments: Patient is instructed prior to discharge to: Take all medications as prescribed by Laura/her mental healthcare provider. Report any adverse effects and or reactions from the medicines to Laura/her outpatient provider promptly. Patient has been instructed & cautioned: To not engage in [name] and or illegal drug use while on prescription medicines. In the event of worsening symptoms, patient is instructed to call the crisis hotline, 911 and or go to the nearest ED for appropriate evaluation and treatment of symptoms. To follow-up with Laura/her primary care provider for your other medical issues, concerns and or health care needs.   Signed: Lindell Spar I, NP 07/25/2016, 3:16 PM

## 2016-07-25 NOTE — Progress Notes (Signed)
  [facility] Adult Case Management Discharge Plan :  Will you be returning to the same living situation after discharge:  Yes,  home At discharge, do you have transportation home?: Yes,  father Do you have the ability to pay for your medications: Yes,  medicare  Release of information consent forms completed and submitted to medical records by CSW.  Patient to Follow up at: [place] Follow up.   Why:  referral made: 07/24/16. Message left requesting appt date/time within 7 days of hospital discharge. Social Worker will contact you directly with this information if not provided by discharge. Thank you.  Contact information: [place] [address] [phone]        [facility] Follow up.   Specialty:  Behavioral Health Why:  If you would like to be seen prior to appointment at [place], please Walk in between 8am-9am Monday through Friday for assessment (medication management/counseling). Thank you.  Contact information: [place] [address] [phone]           Next level of care provider has access to [place] and Suicide Prevention discussed: Yes,  SPE completed with pt; pt declined to consent to family contact.  Have you used any form of tobacco in the last 30 days? (Cigarettes, Smokeless Tobacco, Cigars, and/or Pipes): Yes  Has patient been referred to the Quitline?: Patient refused referral  Patient has been referred for addiction treatment: Yes  [place] LCSW 07/25/2016, 10:37 AM

## 2016-07-25 NOTE — Plan of Care (Signed)
Problem: [facility] Participation in Recreation Therapeutic Interventions Goal: STG-Patient will identify at least five coping skills for ** STG: Coping Skills - Patient will be able to identify at least 5 coping skills for depression by conclusion of recreation therapy tx  Outcome: Adequate for Discharge Pt was able to identify some coping skills at completion of recreation therapy group sessions.  Victorino Sparrow, LRT/CTRS

## 2016-08-02 ENCOUNTER — Other Ambulatory Visit: Payer: Self-pay | Admitting: Surgical

## 2016-08-02 DIAGNOSIS — M542 Cervicalgia: Secondary | ICD-10-CM

## 2016-08-07 ENCOUNTER — Other Ambulatory Visit: Payer: Self-pay | Admitting: Family Medicine

## 2016-08-07 DIAGNOSIS — F411 Generalized anxiety disorder: Secondary | ICD-10-CM

## 2016-08-09 ENCOUNTER — Telehealth: Payer: Self-pay

## 2016-08-09 NOTE — Telephone Encounter (Signed)
[facility] pharmacy faxed a request for medication refill for Hydroxyzine 25mg  q12 hours as needed for insomnia. Last refill was 07-25-2016 #30. Please advise if okay to refill under your name since you are now her PCP. Thanks.

## 2016-08-10 ENCOUNTER — Encounter (HOSPITAL_COMMUNITY): Payer: Self-pay | Admitting: Emergency Medicine

## 2016-08-10 ENCOUNTER — Emergency Department (HOSPITAL_COMMUNITY)
Admission: EM | Admit: 2016-08-10 | Discharge: 2016-08-11 | Disposition: A | Discharge status: Other Acute Inpatient Hospital | Payer: Medicare Other | Attending: Emergency Medicine | Admitting: Emergency Medicine

## 2016-08-10 ENCOUNTER — Other Ambulatory Visit: Payer: Self-pay | Admitting: Family Medicine

## 2016-08-10 ENCOUNTER — Emergency Department (HOSPITAL_COMMUNITY): Payer: Medicare Other

## 2016-08-10 DIAGNOSIS — F332 Major depressive disorder, recurrent severe without psychotic features: Secondary | ICD-10-CM | POA: Diagnosis not present

## 2016-08-10 DIAGNOSIS — F419 Anxiety disorder, unspecified: Secondary | ICD-10-CM | POA: Insufficient documentation

## 2016-08-10 DIAGNOSIS — R45851 Suicidal ideations: Secondary | ICD-10-CM | POA: Diagnosis present

## 2016-08-10 DIAGNOSIS — M542 Cervicalgia: Secondary | ICD-10-CM | POA: Diagnosis not present

## 2016-08-10 DIAGNOSIS — F17200 Nicotine dependence, unspecified, uncomplicated: Secondary | ICD-10-CM | POA: Diagnosis not present

## 2016-08-10 DIAGNOSIS — Z79899 Other long term (current) drug therapy: Secondary | ICD-10-CM | POA: Diagnosis not present

## 2016-08-10 DIAGNOSIS — T50902A Poisoning by unspecified drugs, medicaments and biological substances, intentional self-harm, initial encounter: Secondary | ICD-10-CM

## 2016-08-10 DIAGNOSIS — T1491XA Suicide attempt, initial encounter: Secondary | ICD-10-CM | POA: Diagnosis not present

## 2016-08-10 DIAGNOSIS — F10220 Alcohol dependence with intoxication, uncomplicated: Secondary | ICD-10-CM | POA: Diagnosis not present

## 2016-08-10 DIAGNOSIS — T43222A Poisoning by selective serotonin reuptake inhibitors, intentional self-harm, initial encounter: Secondary | ICD-10-CM | POA: Insufficient documentation

## 2016-08-10 DIAGNOSIS — T50904A Poisoning by unspecified drugs, medicaments and biological substances, undetermined, initial encounter: Secondary | ICD-10-CM | POA: Diagnosis not present

## 2016-08-10 DIAGNOSIS — S199XXA Unspecified injury of neck, initial encounter: Secondary | ICD-10-CM | POA: Diagnosis not present

## 2016-08-10 DIAGNOSIS — F1020 Alcohol dependence, uncomplicated: Secondary | ICD-10-CM | POA: Diagnosis present

## 2016-08-10 DIAGNOSIS — F10120 Alcohol abuse with intoxication, uncomplicated: Secondary | ICD-10-CM | POA: Insufficient documentation

## 2016-08-10 DIAGNOSIS — F17210 Nicotine dependence, cigarettes, uncomplicated: Secondary | ICD-10-CM | POA: Diagnosis not present

## 2016-08-10 LAB — COMPREHENSIVE METABOLIC PANEL
ALT: 26 U/L (ref 14–54)
ANION GAP: 12 (ref 5–15)
AST: 43 U/L — ABNORMAL HIGH (ref 15–41)
Albumin: 4.2 g/dL (ref 3.5–5.0)
Alkaline Phosphatase: 100 U/L (ref 38–126)
BILIRUBIN TOTAL: 0.6 mg/dL (ref 0.3–1.2)
BUN: 6 mg/dL (ref 6–20)
CHLORIDE: 109 mmol/L (ref 101–111)
CO2: 24 mmol/L (ref 22–32)
Calcium: 8.5 mg/dL — ABNORMAL LOW (ref 8.9–10.3)
Creatinine, Ser: 0.59 mg/dL (ref 0.44–1.00)
Glucose, Bld: 80 mg/dL (ref 65–99)
POTASSIUM: 3.5 mmol/L (ref 3.5–5.1)
Sodium: 145 mmol/L (ref 135–145)
TOTAL PROTEIN: 7.7 g/dL (ref 6.5–8.1)

## 2016-08-10 LAB — CBC
HCT: 43.5 % (ref 36.0–46.0)
Hemoglobin: 15.2 g/dL — ABNORMAL HIGH (ref 12.0–15.0)
MCH: 34.5 pg — ABNORMAL HIGH (ref 26.0–34.0)
MCHC: 34.9 g/dL (ref 30.0–36.0)
MCV: 98.9 fL (ref 78.0–100.0)
PLATELETS: 285 10*3/uL (ref 150–400)
RBC: 4.4 MIL/uL (ref 3.87–5.11)
RDW: 13.7 % (ref 11.5–15.5)
WBC: 9.8 10*3/uL (ref 4.0–10.5)

## 2016-08-10 LAB — CBG MONITORING, ED: GLUCOSE-CAPILLARY: 76 mg/dL (ref 65–99)

## 2016-08-10 LAB — ETHANOL: ALCOHOL ETHYL (B): 329 mg/dL — AB (ref <5)

## 2016-08-10 LAB — I-STAT BETA HCG BLOOD, ED (MC, WL, AP ONLY): I-stat hCG, quantitative: 9.8 m[IU]/mL — ABNORMAL HIGH (ref <5)

## 2016-08-10 LAB — SALICYLATE LEVEL

## 2016-08-10 LAB — RAPID URINE DRUG SCREEN, HOSP PERFORMED
Amphetamines: NOT DETECTED
Barbiturates: NOT DETECTED
Benzodiazepines: POSITIVE — AB
Cocaine: NOT DETECTED
Opiates: NOT DETECTED
Tetrahydrocannabinol: NOT DETECTED

## 2016-08-10 LAB — HCG, QUANTITATIVE, PREGNANCY: hCG, Beta Chain, Quant, S: 9 m[IU]/mL — ABNORMAL HIGH (ref <5)

## 2016-08-10 LAB — ACETAMINOPHEN LEVEL

## 2016-08-10 MED ORDER — ACETAMINOPHEN 500 MG PO TABS
1000.0000 mg | ORAL_TABLET | Freq: Once | ORAL | Status: AC
  Administered 2016-08-10: 1000 mg via ORAL
  Filled 2016-08-10: qty 2

## 2016-08-10 MED ORDER — ALUM & MAG HYDROXIDE-SIMETH 200-200-20 MG/5ML PO SUSP: 30.0000 mL | Freq: Four times a day (QID) | ORAL | Status: DC | PRN

## 2016-08-10 MED ORDER — LORAZEPAM 2 MG/ML IJ SOLN: 0.0000 mg | Freq: Four times a day (QID) | INTRAMUSCULAR | Status: DC

## 2016-08-10 MED ORDER — LORAZEPAM 1 MG PO TABS: 0.0000 mg | ORAL_TABLET | Freq: Two times a day (BID) | ORAL | Status: DC

## 2016-08-10 MED ORDER — HYDROXYZINE HCL 50 MG PO TABS: 100.0000 mg | ORAL_TABLET | Freq: Every evening | ORAL | 0 refills | Status: DC | PRN

## 2016-08-10 MED ORDER — HYDROXYZINE HCL 25 MG PO TABS: 100.0000 mg | ORAL_TABLET | Freq: Every evening | ORAL | Status: DC | PRN

## 2016-08-10 MED ORDER — ACETAMINOPHEN 325 MG PO TABS
650.0000 mg | ORAL_TABLET | ORAL | Status: DC | PRN
  Administered 2016-08-11: 650 mg via ORAL
  Filled 2016-08-10: qty 2

## 2016-08-10 MED ORDER — LORAZEPAM 1 MG PO TABS
0.0000 mg | ORAL_TABLET | Freq: Four times a day (QID) | ORAL | Status: DC
  Administered 2016-08-10 – 2016-08-11 (×2): 2 mg via ORAL
  Filled 2016-08-10 (×2): qty 2

## 2016-08-10 MED ORDER — VITAMIN B-1 100 MG PO TABS
100.0000 mg | ORAL_TABLET | Freq: Every day | ORAL | Status: DC
  Administered 2016-08-10 – 2016-08-11 (×2): 100 mg via ORAL
  Filled 2016-08-10 (×2): qty 1

## 2016-08-10 MED ORDER — LORAZEPAM 2 MG/ML IJ SOLN: 0.0000 mg | Freq: Two times a day (BID) | INTRAMUSCULAR | Status: DC

## 2016-08-10 MED ORDER — METHOCARBAMOL 500 MG PO TABS
500.0000 mg | ORAL_TABLET | Freq: Three times a day (TID) | ORAL | Status: DC
  Administered 2016-08-10 – 2016-08-11 (×3): 500 mg via ORAL
  Filled 2016-08-10 (×4): qty 1

## 2016-08-10 MED ORDER — IBUPROFEN 200 MG PO TABS
600.0000 mg | ORAL_TABLET | Freq: Three times a day (TID) | ORAL | Status: DC | PRN
  Administered 2016-08-11 (×2): 600 mg via ORAL
  Filled 2016-08-10 (×2): qty 3

## 2016-08-10 MED ORDER — THIAMINE HCL 100 MG/ML IJ SOLN: 100.0000 mg | Freq: Every day | INTRAMUSCULAR | Status: DC
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
## 2016-08-10 NOTE — ED Notes (Signed)
Ambulatory to room 35 from [facility].  Tremulous.  Dinner tray given and POC explained.
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
## 2016-08-10 NOTE — ED Notes (Signed)
Hourly rounding reveals patient sleeping in room. No complaints, stable, in no acute distress. Q15 minute rounds and monitoring via [employer] to continue. 

## 2016-08-10 NOTE — ED Notes (Signed)
Dr Regenia Skeeter made aware of pt ETOH level
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
## 2016-08-10 NOTE — BH Assessment (Signed)
[facility] Assessment Progress Note  Case was staffed with Lord DNP who recommended a inpatient admission as appropriate bed placement is investigated.         

## 2016-08-10 NOTE — ED Notes (Signed)
Pt ambulated to BR and back to room w/o difficulty 

## 2016-08-10 NOTE — ED Provider Notes (Signed)
C-spine cleared. Patient is awake and alert. Medically clear and ready for TTS evaluation.   Blanchie Dessert, MD 08/10/16 1714

## 2016-08-10 NOTE — ED Notes (Signed)
PT AWARE OF NEED FOR URINE 

## 2016-08-10 NOTE — ED Notes (Signed)
PT STATES SHE DRINKS A PINT OR 2 /DAILY. LAST DRINK 7AM

## 2016-08-10 NOTE — ED Notes (Signed)
ED Provider at bedside. 

## 2016-08-10 NOTE — ED Notes (Signed)
PT belonging one blue jeans with one black shirt located at nurse station (RES side)

## 2016-08-10 NOTE — ED Notes (Signed)
Report to include Situation, Background, Assessment, and Recommendations received from [facility]. Patient alert and oriented, warm and dry, in no acute distress. Patient denies SI, HI, AVH and pain. Patient made aware of Q15 minute rounds and security cameras for their safety. Patient instructed to come to me with needs or concerns. Pt. C/o anxiety and tremors, see CIWA.

## 2016-08-10 NOTE — ED Notes (Signed)
Bed: RESA Expected date:  Expected time:  Means of arrival:  Comments: OD- gabapentin, prozac

## 2016-08-10 NOTE — ED Provider Notes (Signed)
[facility] DEPT Provider Note   CSN: [id number] Arrival date & time: 08/10/16  1258     History   Chief Complaint Chief Complaint  Patient presents with  . Suicidal  . Fall  . Neck Pain    HPI Laura [name] is a 46 y.o. female.  HPI  46 year old female presents with an intentional ingestion of 12 tablets of Prozac and 5 tablets of Neurontin in an attempt to kill herself. Denies other co ingestants. She states this occurred around 9 AM. She was brought in by EMS. She states last night she was around a pool and slipped and fell hitting her left eyebrow causing a small abrasion as well as causing pain in her neck and low back. She has chronic neck and low back pain. She's had 5 surgeries on her low back. She has also been having left-sided radiculopathy for several months in her neck. She states she's been told she needs an MRI. She has chronic tingling and pain in her left arm. The pain is worse since the fall but she denies any new numbness or weakness. There is no weakness or numbness in her lower extremities and no bowel or bladder incontinence. No headache or loss of consciousness. No facial pain. Has been drinking alcohol daily since being released from behavioral health on 5/9. States she last drank last night, when asked again she later clarifies this to be around 5 AM today. Denies any illicit drug use. Still feels suicidal  Past Medical History:  Diagnosis Date  . Anxiety   . Substance abuse     Patient Active Problem List   Diagnosis Date Noted  . Alcohol use disorder, moderate, dependence ([place]) 07/23/2016  . 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 Hypokalemia 07/23/2016  . Prolonged Q-T interval on ECG 07/23/2016  . MDD (major depressive disorder), recurrent severe, without psychosis ([place]) 07/21/2016  . Osteoarthritis of spine with radiculopathy, cervical region 07/02/2016  . Substance abuse  07/02/2016  . Lumbar spondylosis 07/02/2016    Past Surgical History:  Procedure Laterality Date  . BACK SURGERY    . KNEE SURGERY      OB History    No data available       Home Medications    Prior to Admission medications   Medication Sig Start Date End Date Taking? Authorizing Provider  FLUoxetine (PROZAC) 20 MG capsule Take 1 capsule (20 mg total) by mouth daily. For depression 07/26/16  Yes Nwoko, Herbert Pun I, NP  gabapentin (NEURONTIN) 300 MG capsule Take 1 capsule (300 mg total) by mouth 3 (three) times daily. For agitation 07/25/16  Yes Lindell Spar I, NP  hydrOXYzine (ATARAX/VISTARIL) 50 MG tablet Take 2 tablets (100 mg total) by mouth at bedtime as needed (sleep, please do not combine with lunesta). 08/10/16  Yes Elby Beck, FNP  methocarbamol (ROBAXIN) 500 MG tablet Take 1 tablet (500 mg total) by mouth 3 (three) times daily. For muscle spasms 07/25/16  Yes Lindell Spar I, NP  eszopiclone (LUNESTA) 2 MG TABS tablet Take 1 tablet (2 mg total) by mouth at bedtime. (Take immediately before bedtime): For sleep Patient not taking: Reported on 08/10/2016 07/25/16   Lindell Spar I, NP  lidocaine (LIDODERM) 5 % Place 1 patch onto the skin daily. Remove & Discard patch within 12 hours or as directed by MD: For pain management Patient not taking: Reported on 08/10/2016 07/26/16   Lindell Spar I, NP  nicotine (NICODERM CQ -  DOSED IN MG/24 HOURS) 21 mg/24hr patch Place 1 patch (21 mg total) onto the skin daily. For smoking cessation Patient not taking: Reported on 08/10/2016 07/26/16   Encarnacion Slates, NP    Family History Family History  Problem Relation Age of Onset  . Family history unknown: Yes    Social History Social History  Substance Use Topics  . Smoking status: Current Every Day Smoker    Packs/day: 1.00  . Smokeless tobacco: Never Used  . Alcohol use Yes     Comment: 3-4 pints a day     Allergies   Patient has no known allergies.   Review of Systems Review of  Systems  Eyes: Negative for visual disturbance.  Cardiovascular: Negative for chest pain.  Gastrointestinal: Negative for abdominal pain and vomiting.  Musculoskeletal: Positive for back pain and neck pain.  Neurological: Positive for numbness. Negative for weakness and headaches.  All other systems reviewed and are negative.    Physical Exam Updated Vital Signs BP 110/79   Pulse 68   Temp 98 F (36.7 C) (Oral)   Resp 17   SpO2 98%   Physical Exam  Constitutional: She is oriented to person, place, and time. She appears well-developed and well-nourished. Cervical collar in place.  intoxicated  HENT:  Head: Normocephalic. Head is with abrasion.    Right Ear: External ear normal.  Left Ear: External ear normal.  Nose: Nose normal.  Eyes: EOM are normal. Pupils are equal, round, and reactive to light. Right eye exhibits no discharge. Left eye exhibits no discharge.  Neck: Neck supple. Muscular tenderness present.  Cardiovascular: Normal rate, regular rhythm and normal heart sounds.   Pulmonary/Chest: Effort normal and breath sounds normal.  Abdominal: Soft. There is no tenderness.  Musculoskeletal:       Thoracic back: She exhibits no tenderness.       Lumbar back: She exhibits tenderness (diffuse lumbar).       Back:  Neurological: She is alert and oriented to person, place, and time.  CN 3-12 grossly intact. 5/5 strength in all 4 extremities. There is some giveaway but when retested she shows 5/5 strength. Grossly normal sensation.   Skin: Skin is warm and dry. She is not diaphoretic.  Nursing note and vitals reviewed.    ED Treatments / Results  Labs (all labs ordered are listed, but only abnormal results are displayed) Labs Reviewed  COMPREHENSIVE METABOLIC PANEL - Abnormal; Notable for the following:       Result Value   Calcium 8.5 (*)    AST 43 (*)    All other components within normal limits  ETHANOL - Abnormal; Notable for the following:    Alcohol, Ethyl  (B) 329 (*)    All other components within normal limits  ACETAMINOPHEN LEVEL - Abnormal; Notable for the following:    Acetaminophen (Tylenol), Serum <10 (*)    All other components within normal limits  CBC - Abnormal; Notable for the following:    Hemoglobin 15.2 (*)    MCH 34.5 (*)    All other components within normal limits  HCG, QUANTITATIVE, PREGNANCY - Abnormal; Notable for the following:    hCG, Beta Chain, Quant, S 9 (*)    All other components within normal limits  I-STAT BETA HCG BLOOD, ED ([facility], [facility], [facility] ONLY) - Abnormal; Notable for the following:    I-stat hCG, quantitative 9.8 (*)    All other components within normal limits  SALICYLATE LEVEL  RAPID URINE  DRUG SCREEN, HOSP PERFORMED  CBG MONITORING, ED    EKG  EKG Interpretation  Date/Time:  Friday Aug 10 2016 13:17:49 EDT Ventricular Rate:  74 PR Interval:    QRS Duration: 91 QT Interval:  395 QTC Calculation: 439 R Axis:   86 Text Interpretation:  Sinus rhythm Borderline T abnormalities, anterior leads no significant change since Jul 23 2016 Confirmed by Sherwood Gambler [phone]) on 08/10/2016 2:43:05 PM       Radiology Ct Cervical Spine Wo Contrast  Result Date: 08/10/2016 CLINICAL DATA:  Recent fall with neck pain, initial encounter EXAM: CT CERVICAL SPINE WITHOUT CONTRAST TECHNIQUE: Multidetector CT imaging of the cervical spine was performed without intravenous contrast. Multiplanar CT image reconstructions were also generated. COMPARISON:  07/02/2016 FINDINGS: Alignment: Loss of normal cervical lordosis is noted. Skull base and vertebrae: Some calvarial thickening is noted. Seven cervical segments are noted. Vertebral body height is well maintained. Osteophytic changes are noted from C3-C7. Some central canal stenosis and neural foraminal narrowing is noted secondary to these changes as well as mild facet hypertrophic changes. No acute fracture is seen. Soft tissues and spinal canal: No soft tissue abnormality is  noted. Disc levels: Disc space narrowing is noted primarily at C4-5, C5-6 and C6-7. Upper chest: Low-grade sees are within normal limits. A tiny subpleural nodule is noted in the right apex. \IMPRESSION: Degenerative change without acute abnormality. Tiny subpleural nodule in the right apex. No follow-up needed if patient is low-risk. Non-contrast chest CT can be considered in 12 months if patient is high-risk. This recommendation follows the consensus statement: Guidelines for Management of Incidental Pulmonary Nodules Detected on CT Images: From the Fleischner Society 2017; Radiology 2017; 284:228-243. Electronically Signed   By: Inez Catalina M.D.   On: 08/10/2016 15:14    Procedures Procedures (including critical care time)  Medications Ordered in ED Medications  acetaminophen (TYLENOL) tablet 1,000 mg (1,000 mg Oral Given 08/10/16 1534)     Initial Impression / Assessment and Plan / ED Course  I have reviewed the triage vital signs and the nursing notes.  Pertinent labs & imaging results that were available during my care of the patient were reviewed by me and considered in my medical decision making (see chart for details).     Patient is still suicidal here. With control recommends observation until 5 PM which is 6 hours after her possible ingestion and 11 AM although she told me 9 AM. She has been well-appearing here. She is somewhat sleepy, likely a combination of the Neurontin and alcohol. However she has no acute neurologic deficits. The numbness is chronic. I have very low suspicion for spinal cord injury. Headache her low back is a chronic issue and I do not think imaging is needed. Her hCG is 9 which is nonspecific. She states she has not had intercourse since February. He does have irregular menstrual cycles. She needs this repeated in the next 48 hours but she is probably not pregnant. CT of her neck is unremarkable except for degenerative disease. When she is more sober, she will  need TTS consult.  Final Clinical Impressions(s) / ED Diagnoses   Final diagnoses:  Alcohol intoxication in active alcoholic without complication ([place])  Intentional drug overdose, initial encounter [facility])    New Prescriptions New Prescriptions   No medications on file     Sherwood Gambler, MD 08/10/16 1539

## 2016-08-10 NOTE — Telephone Encounter (Signed)
Refill for #30, no refills sent to patient's pharmacy.

## 2016-08-10 NOTE — ED Triage Notes (Signed)
Pt reports that she overdosed "because I'm in so much pain, I want to die." Pt is tearful. Pt is sleepy but is A&O

## 2016-08-11 ENCOUNTER — Encounter (HOSPITAL_COMMUNITY): Payer: Self-pay | Admitting: Registered Nurse

## 2016-08-11 ENCOUNTER — Inpatient Hospital Stay
Admission: AD | Admit: 2016-08-11 | Discharge: 2016-08-14 | DRG: 885 | Disposition: A | Discharge status: Home / Self Care | Payer: Medicare Other | Source: Intra-hospital | Attending: Psychiatry | Admitting: Psychiatry

## 2016-08-11 ENCOUNTER — Encounter: Payer: Self-pay | Admitting: Psychiatry

## 2016-08-11 DIAGNOSIS — F10220 Alcohol dependence with intoxication, uncomplicated: Secondary | ICD-10-CM | POA: Diagnosis present

## 2016-08-11 DIAGNOSIS — Z915 Personal history of self-harm: Secondary | ICD-10-CM | POA: Insufficient documentation

## 2016-08-11 DIAGNOSIS — F4310 Post-traumatic stress disorder, unspecified: Secondary | ICD-10-CM | POA: Diagnosis present

## 2016-08-11 DIAGNOSIS — T1491XA Suicide attempt, initial encounter: Secondary | ICD-10-CM

## 2016-08-11 DIAGNOSIS — M47816 Spondylosis without myelopathy or radiculopathy, lumbar region: Secondary | ICD-10-CM | POA: Diagnosis present

## 2016-08-11 DIAGNOSIS — Z3201 Encounter for pregnancy test, result positive: Secondary | ICD-10-CM | POA: Diagnosis present

## 2016-08-11 DIAGNOSIS — F1020 Alcohol dependence, uncomplicated: Secondary | ICD-10-CM | POA: Diagnosis present

## 2016-08-11 DIAGNOSIS — T426X2A Poisoning by other antiepileptic and sedative-hypnotic drugs, intentional self-harm, initial encounter: Secondary | ICD-10-CM

## 2016-08-11 DIAGNOSIS — F332 Major depressive disorder, recurrent severe without psychotic features: Principal | ICD-10-CM | POA: Diagnosis present

## 2016-08-11 DIAGNOSIS — G8929 Other chronic pain: Secondary | ICD-10-CM | POA: Diagnosis present

## 2016-08-11 DIAGNOSIS — T43222A Poisoning by selective serotonin reuptake inhibitors, intentional self-harm, initial encounter: Secondary | ICD-10-CM | POA: Diagnosis not present

## 2016-08-11 DIAGNOSIS — Z79899 Other long term (current) drug therapy: Secondary | ICD-10-CM | POA: Diagnosis not present

## 2016-08-11 DIAGNOSIS — Y908 Blood alcohol level of 240 mg/100 ml or more: Secondary | ICD-10-CM | POA: Diagnosis present

## 2016-08-11 DIAGNOSIS — Z9151 Personal history of suicidal behavior: Secondary | ICD-10-CM | POA: Diagnosis present

## 2016-08-11 DIAGNOSIS — F17210 Nicotine dependence, cigarettes, uncomplicated: Secondary | ICD-10-CM

## 2016-08-11 MED ORDER — NICOTINE 21 MG/24HR TD PT24
21.0000 mg | MEDICATED_PATCH | Freq: Every day | TRANSDERMAL | Status: DC
  Administered 2016-08-12 – 2016-08-14 (×3): 21 mg via TRANSDERMAL
  Filled 2016-08-11 (×4): qty 1

## 2016-08-11 MED ORDER — MAGNESIUM HYDROXIDE 400 MG/5ML PO SUSP: 30.0000 mL | Freq: Every day | ORAL | Status: DC | PRN

## 2016-08-11 MED ORDER — HYDROXYZINE HCL 25 MG PO TABS
25.0000 mg | ORAL_TABLET | Freq: Three times a day (TID) | ORAL | Status: DC | PRN
  Administered 2016-08-11 – 2016-08-12 (×2): 25 mg via ORAL
  Filled 2016-08-11 (×2): qty 1

## 2016-08-11 MED ORDER — LOPERAMIDE HCL 2 MG PO CAPS
2.0000 mg | ORAL_CAPSULE | ORAL | Status: DC | PRN
  Administered 2016-08-11: 2 mg via ORAL
  Filled 2016-08-11: qty 1

## 2016-08-11 MED ORDER — LORAZEPAM 1 MG PO TABS
2.0000 mg | ORAL_TABLET | Freq: Once | ORAL | Status: AC
  Administered 2016-08-11: 2 mg via ORAL
  Filled 2016-08-11: qty 2

## 2016-08-11 MED ORDER — LORAZEPAM 2 MG/ML IJ SOLN: 1.0000 mg | Freq: Four times a day (QID) | INTRAMUSCULAR | Status: DC | PRN

## 2016-08-11 MED ORDER — TRAZODONE HCL 100 MG PO TABS
100.0000 mg | ORAL_TABLET | Freq: Every day | ORAL | Status: DC
  Administered 2016-08-11 – 2016-08-12 (×2): 100 mg via ORAL
  Filled 2016-08-11 (×2): qty 1

## 2016-08-11 MED ORDER — LORAZEPAM 1 MG PO TABS
2.0000 mg | ORAL_TABLET | ORAL | Status: DC
  Administered 2016-08-11 (×2): 2 mg via ORAL
  Filled 2016-08-11 (×2): qty 2

## 2016-08-11 MED ORDER — ALUM & MAG HYDROXIDE-SIMETH 200-200-20 MG/5ML PO SUSP: 30.0000 mL | ORAL | Status: DC | PRN

## 2016-08-11 MED ORDER — LORAZEPAM 1 MG PO TABS: 1.0000 mg | ORAL_TABLET | Freq: Four times a day (QID) | ORAL | Status: DC | PRN

## 2016-08-11 MED ORDER — ACETAMINOPHEN 325 MG PO TABS
650.0000 mg | ORAL_TABLET | Freq: Four times a day (QID) | ORAL | Status: DC | PRN
  Administered 2016-08-11 – 2016-08-12 (×2): 650 mg via ORAL
  Filled 2016-08-11 (×2): qty 2

## 2016-08-11 NOTE — ED Notes (Signed)
Hourly rounding reveals patient in room. Stable, in no acute distress. Q15 minute rounds and monitoring via Security Cameras to continue. 

## 2016-08-11 NOTE — ED Notes (Signed)
Hourly rounding reveals patient sleeping in room. No complaints, stable, in no acute distress. Q15 minute rounds and monitoring via [employer] to continue. 

## 2016-08-11 NOTE — BH Assessment (Signed)
Patient has been accepted to [facility].  Accepting physician is Dr. Bary Leriche  Attending Physician will be Dr. Bary Leriche.  Patient has been assigned to room 301, by [place].  Call report to [phone].  Representative/Transfer Coordinator is Virgina Organ Patient pre-admitted by [facility] Patient Access [facility] )   [facility] Staff Mendel Ryder, [facility]) made aware of acceptance.

## 2016-08-11 NOTE — ED Provider Notes (Signed)
Contacted by nursing stating that the patient has been accepted at [facility] psychiatric facility under Dr. Bary Leriche. I personally assessed the patient who has no acute complaints. Felt she was appropriate for transfer.   Fatima Blank, MD 08/11/16 [phone]

## 2016-08-11 NOTE — ED Notes (Signed)
Tremors have decreased through the day.  She is still somewhat shaky.  She now states she has an upset stomach and severe back pain.

## 2016-08-11 NOTE — ED Notes (Signed)
Pt discharged safely with [place] driver.  All belongings sent with patient.

## 2016-08-11 NOTE — ED Notes (Signed)
Pt has very severe tremors.  She has no appetite.  She is tearful and depressed.  Md aware and verbal order was obtained.  Fluids encouraged.  15 minute checks and video monitoring in place.

## 2016-08-11 NOTE — Consult Note (Signed)
[facility] Face-to-Face Psychiatry Consult   Reason for Consult:  Attempt suicide Referring Physician:  EDP Patient Identification: Laura [name] MRN:  [id number] Principal Diagnosis: MDD (major depressive disorder), recurrent severe, without psychosis ([place]) Diagnosis:   Patient Active Problem List   Diagnosis Date Noted  . Alcohol use disorder, moderate, dependence ([place]) [F10.20] 07/23/2016  . 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 Hypokalemia [E87.6] 07/23/2016  . Prolonged Q-T interval on ECG [R94.31] 07/23/2016  . MDD (major depressive disorder), recurrent severe, without psychosis ([place]) [F33.2] 07/21/2016  . Osteoarthritis of spine with radiculopathy, cervical region [facility] 07/02/2016  . Substance abuse [F19.10] 07/02/2016  . Lumbar spondylosis [M47.816] 07/02/2016    Total Time spent with patient: 45 minutes  Subjective:   Laura [name] is a 46 y.o. female patient [facility] after suicide attempt  HPI:  patient seen by Dr. Modesta Messing and this provider.  Chart reviewed and face to face evaluation on 08/11/16.   On evaluation:  [name] reports that she recently moved in with her father from [place] in February 2018.  But has been feeling really guilty ad hopeless because she can't take of her self; and also because she doesn't know anyone here.  States that she had been drinking and feeling depressed she tool "about 15 Prozac 20 mg and 5 Gabapentin 300 mg..  I was just feeling overwhelmed.     Past Psychiatric History: Major Depressive Disorder  Risk to Self: Suicidal Ideation: Yes-Currently Present Suicidal Intent: Yes-Currently Present Is patient at risk for suicide?: Yes Suicidal Plan?: Yes-Currently Present Specify Current Suicidal Plan: Overdose Access to Means: Yes Specify Access to Suicidal Means: Pt has medications What has been your use of drugs/alcohol within the last 12 months?:  Current use How many times?: 1 Other Self Harm Risks: NA Triggers for Past Attempts: Unknown Intentional Self Injurious Behavior: None Risk to Others: Homicidal Ideation: No Thoughts of Harm to Others: No Current Homicidal Intent: No Current Homicidal Plan: No Access to Homicidal Means: No Identified Victim: NA History of harm to others?: No Assessment of Violence: None Noted Violent Behavior Description: NA Does patient have access to weapons?: No Criminal Charges Pending?: Yes Describe Pending Criminal Charges: Drug charges Does patient have a court date: Yes Court Date: 08/29/16 Prior Inpatient Therapy: Prior Inpatient Therapy: Yes Prior Therapy Dates: 2018 Prior Therapy Facilty/Provider(s): [facility] Reason for Treatment: MH issues, SA issues Prior Outpatient Therapy: Prior Outpatient Therapy: No Prior Therapy Dates: NA Prior Therapy Facilty/Provider(s): NA Reason for Treatment: NA Does patient have an ACCT team?: No Does patient have Intensive In-House Services?  : No Does patient have [facility] services? : No Does patient have P4CC services?: No  Past Medical History:  Past Medical History:  Diagnosis Date  . Anxiety   . Substance abuse     Past Surgical History:  Procedure Laterality Date  . BACK SURGERY    . KNEE SURGERY     Family History:  Family History  Problem Relation Age of Onset  . Family history unknown: Yes   Family Psychiatric  History: Unsure Social History:  History  Alcohol Use  . Yes    Comment: 3-4 pints a day     History  Drug Use No    Comment: denies     Social History   Social History  . Marital status: Single    Spouse name: N/A  . Number of children: N/A  . Years of  education: N/A   Social History Main Topics  . Smoking status: Current Every Day Smoker    Packs/day: 1.00  . Smokeless tobacco: Never Used  . Alcohol use Yes     Comment: 3-4 pints a day  . Drug use: No     Comment: denies   . Sexual activity: No   Other  Topics Concern  . None   Social History Narrative  . None   Additional Social History:    Allergies:  No Known Allergies  Labs:  Results for orders placed or performed during the hospital encounter of 08/10/16 (from the past 48 hour(s))  Comprehensive metabolic panel     Status: Abnormal   Collection Time: 08/10/16  1:18 PM  Result Value Ref Range   Sodium 145 135 - 145 mmol/L   Potassium 3.5 3.5 - 5.1 mmol/L   Chloride 109 101 - 111 mmol/L   CO2 24 22 - 32 mmol/L   Glucose, Bld 80 65 - 99 mg/dL   BUN 6 6 - 20 mg/dL   Creatinine, Ser 0.59 0.44 - 1.00 mg/dL   Calcium 8.5 (L) 8.9 - 10.3 mg/dL   Total Protein 7.7 6.5 - 8.1 g/dL   Albumin 4.2 3.5 - 5.0 g/dL   AST 43 (H) 15 - 41 U/L   ALT 26 14 - 54 U/L   Alkaline Phosphatase 100 38 - 126 U/L   Total Bilirubin 0.6 0.3 - 1.2 mg/dL   GFR calc non Af Amer >60 >60 mL/min   GFR calc Af Amer >60 >60 mL/min    Comment: (NOTE) The eGFR has been calculated using the CKD EPI equation. This calculation has not been validated in all clinical situations. eGFR's persistently <60 mL/min signify possible Chronic Kidney Disease.    Anion gap 12 5 - 15  cbc     Status: Abnormal   Collection Time: 08/10/16  1:18 PM  Result Value Ref Range   WBC 9.8 4.0 - 10.5 K/uL   RBC 4.40 3.87 - 5.11 MIL/uL   Hemoglobin 15.2 (H) 12.0 - 15.0 g/dL   HCT 43.5 36.0 - 46.0 %   MCV 98.9 78.0 - 100.0 fL   MCH 34.5 (H) 26.0 - 34.0 pg   MCHC 34.9 30.0 - 36.0 g/dL   RDW 13.7 11.5 - 15.5 %   Platelets 285 150 - 400 K/uL  hCG, quantitative, pregnancy     Status: Abnormal   Collection Time: 08/10/16  1:18 PM  Result Value Ref Range   hCG, Beta Chain, Quant, S 9 (H) <5 mIU/mL    Comment:          GEST. AGE      CONC.  (mIU/mL)   <=1 WEEK        5 - 50     2 WEEKS       50 - 500     3 WEEKS       100 - 10,000     4 WEEKS     1,000 - 30,000     5 WEEKS     3,500 - 115,000   6-8 WEEKS     12,000 - 270,000    12 WEEKS     15,000 - 220,000        FEMALE AND  NON-PREGNANT FEMALE:     LESS THAN 5 mIU/mL   Ethanol     Status: Abnormal   Collection Time: 08/10/16  1:19 PM  Result Value Ref Range  Alcohol, Ethyl (B) 329 (HH) <5 mg/dL    Comment:        LOWEST DETECTABLE LIMIT FOR SERUM ALCOHOL IS 5 mg/dL FOR MEDICAL PURPOSES ONLY CRITICAL RESULT CALLED TO, READ BACK BY AND VERIFIED WITH: J.TALKINGTON RN [phone] A.QUIZON MT   Salicylate level     Status: None   Collection Time: 08/10/16  1:19 PM  Result Value Ref Range   Salicylate Lvl <0.9 2.8 - 30.0 mg/dL  Acetaminophen level     Status: Abnormal   Collection Time: 08/10/16  1:19 PM  Result Value Ref Range   Acetaminophen (Tylenol), Serum <10 (L) 10 - 30 ug/mL    Comment:        THERAPEUTIC CONCENTRATIONS VARY SIGNIFICANTLY. A RANGE OF 10-30 ug/mL MAY BE AN EFFECTIVE CONCENTRATION FOR MANY PATIENTS. HOWEVER, SOME ARE BEST TREATED AT CONCENTRATIONS OUTSIDE THIS RANGE. ACETAMINOPHEN CONCENTRATIONS >150 ug/mL AT 4 HOURS AFTER INGESTION AND >50 ug/mL AT 12 HOURS AFTER INGESTION ARE OFTEN ASSOCIATED WITH TOXIC REACTIONS.   CBG monitoring, ED     Status: None   Collection Time: 08/10/16  1:26 PM  Result Value Ref Range   Glucose-Capillary 76 65 - 99 mg/dL   Comment 1 Notify RN    Comment 2 Document in Chart   I-Stat beta hCG blood, ED     Status: Abnormal   Collection Time: 08/10/16  1:26 PM  Result Value Ref Range   I-stat hCG, quantitative 9.8 (H) <5 mIU/mL   Comment 3            Comment:   GEST. AGE      CONC.  (mIU/mL)   <=1 WEEK        5 - 50     2 WEEKS       50 - 500     3 WEEKS       100 - 10,000     4 WEEKS     1,000 - 30,000        FEMALE AND NON-PREGNANT FEMALE:     LESS THAN 5 mIU/mL   Rapid urine drug screen (hospital performed)     Status: Abnormal   Collection Time: 08/10/16  6:29 PM  Result Value Ref Range   Opiates NONE DETECTED NONE DETECTED   Cocaine NONE DETECTED NONE DETECTED   Benzodiazepines POSITIVE (A) NONE DETECTED   Amphetamines NONE  DETECTED NONE DETECTED   Tetrahydrocannabinol NONE DETECTED NONE DETECTED   Barbiturates NONE DETECTED NONE DETECTED    Comment:        DRUG SCREEN FOR MEDICAL PURPOSES ONLY.  IF CONFIRMATION IS NEEDED FOR ANY PURPOSE, NOTIFY LAB WITHIN 5 DAYS.        LOWEST DETECTABLE LIMITS FOR URINE DRUG SCREEN Drug Class       Cutoff (ng/mL) Amphetamine      1000 Barbiturate      200 Benzodiazepine   323 Tricyclics       557 Opiates          300 Cocaine          300 THC              50     Current Facility-Administered Medications  Medication Dose Route Frequency Provider Last Rate Last Dose  . acetaminophen (TYLENOL) tablet 650 mg  650 mg Oral Q4H PRN Blanchie Dessert, MD      . alum & mag hydroxide-simeth (MAALOX/MYLANTA) 200-200-20 MG/5ML suspension 30 mL  30 mL Oral Q6H PRN  Blanchie Dessert, MD      . hydrOXYzine (ATARAX/VISTARIL) tablet 100 mg  100 mg Oral QHS PRN Blanchie Dessert, MD      . ibuprofen (ADVIL,MOTRIN) tablet 600 mg  600 mg Oral Q8H PRN Blanchie Dessert, MD   600 mg at 08/11/16 1153  . LORazepam (ATIVAN) tablet 1 mg  1 mg Oral Q6H PRN Norman Clay, MD       Or  . LORazepam (ATIVAN) injection 1 mg  1 mg Intravenous Q6H PRN Hisada, Reina, MD      . LORazepam (ATIVAN) tablet 2 mg  2 mg Oral Q4H Hisada, Reina, MD   2 mg at 08/11/16 1245  . methocarbamol (ROBAXIN) tablet 500 mg  500 mg Oral TID Blanchie Dessert, MD   500 mg at 08/11/16 0943  . thiamine (VITAMIN B-1) tablet 100 mg  100 mg Oral Daily Orlie Dakin, MD   100 mg at 08/11/16 1610   Or  . thiamine (B-1) injection 100 mg  100 mg Intravenous Daily Orlie Dakin, MD       Current Outpatient Prescriptions  Medication Sig Dispense Refill  . FLUoxetine (PROZAC) 20 MG capsule Take 1 capsule (20 mg total) by mouth daily. For depression 30 capsule 0  . gabapentin (NEURONTIN) 300 MG capsule Take 1 capsule (300 mg total) by mouth 3 (three) times daily. For agitation 90 capsule 0  . hydrOXYzine (ATARAX/VISTARIL) 50  MG tablet Take 2 tablets (100 mg total) by mouth at bedtime as needed (sleep, please do not combine with lunesta). 30 tablet 0  . methocarbamol (ROBAXIN) 500 MG tablet Take 1 tablet (500 mg total) by mouth 3 (three) times daily. For muscle spasms 15 tablet 0  . eszopiclone (LUNESTA) 2 MG TABS tablet Take 1 tablet (2 mg total) by mouth at bedtime. (Take immediately before bedtime): For sleep (Patient not taking: Reported on 08/10/2016) 15 tablet 0  . lidocaine (LIDODERM) 5 % Place 1 patch onto the skin daily. Remove & Discard patch within 12 hours or as directed by MD: For pain management (Patient not taking: Reported on 08/10/2016) 7 patch 0  . nicotine (NICODERM CQ - DOSED IN MG/24 HOURS) 21 mg/24hr patch Place 1 patch (21 mg total) onto the skin daily. For smoking cessation (Patient not taking: Reported on 08/10/2016) 28 patch 0    Musculoskeletal: Strength & Muscle Tone: within normal limits Gait & Station: normal Patient leans: N/A  Psychiatric Specialty Exam: Physical Exam  Vitals reviewed. Constitutional: She is oriented to person, place, and time.  Neck: Normal range of motion.  Complaint of pain.  Reports neck injury   Respiratory: Effort normal.  Musculoskeletal: Normal range of motion.  Neurological: She is alert and oriented to person, place, and time.  Psychiatric: Her speech is normal. Thought content normal. She is withdrawn. Cognition and memory are normal. She expresses impulsivity. She exhibits a depressed mood.    Review of Systems  Musculoskeletal: Positive for myalgias.  Psychiatric/Behavioral: Positive for depression, substance abuse and suicidal ideas. Negative for hallucinations. The patient is nervous/anxious.   All other systems reviewed and are negative.   Blood pressure 122/72, pulse 70, temperature 98.3 F (36.8 C), temperature source Oral, resp. rate 16, SpO2 99 %.There is no height or weight on file to calculate BMI.  General Appearance: Fairly Groomed  Eye  Contact:  Good  Speech:  Clear and Coherent and Normal Rate  Volume:  Normal  Mood:  Anxious and Depressed  Affect:  Appropriate  Thought Process:  Coherent and Goal Directed  Orientation:  Full (Time, Place, and Person)  Thought Content:  Denies hallucinations, delusions, or paranoia  Suicidal Thoughts:  Suicide attempt; but denies suicidal idation at this time  Homicidal Thoughts:  No  Memory:  Immediate;   Good Recent;   Good Remote;   Good  Judgement:  Fair  Insight:  Lacking  Psychomotor Activity:  Restlessness and Tremor  Concentration:  Concentration: Fair and Attention Span: Fair  Recall:  Good  Fund of Knowledge:  Fair  Language:  Good  Akathisia:  No  Handed:  Right  AIMS (if indicated):     Assets:  Communication Skills Desire for Improvement  ADL's:  Intact  Cognition:  WNL  Sleep:        Treatment Plan Summary: Daily contact with patient to assess and evaluate symptoms and progress in treatment, Medication management and Plan Inpatient psychiac treatment Start medications Ativan withdrawal protocol    Disposition: Recommend psychiatric Inpatient admission when medically cleared.  Rankin, Shuvon, NP 08/11/2016 1:15 PM

## 2016-08-11 NOTE — Progress Notes (Signed)
CSW faxed patient's referral to: [facility], [place] [facility], [place], [place], [place].   Abundio Miu, [facility]  Clinical Social Worker [phone]

## 2016-08-12 ENCOUNTER — Other Ambulatory Visit: Payer: Self-pay

## 2016-08-12 DIAGNOSIS — F332 Major depressive disorder, recurrent severe without psychotic features: Principal | ICD-10-CM

## 2016-08-12 DIAGNOSIS — F4310 Post-traumatic stress disorder, unspecified: Secondary | ICD-10-CM | POA: Diagnosis present

## 2016-08-12 LAB — HCG, QUANTITATIVE, PREGNANCY: hCG, Beta Chain, Quant, S: 13 m[IU]/mL — ABNORMAL HIGH (ref <5)

## 2016-08-12 MED ORDER — PRAZOSIN HCL 1 MG PO CAPS
1.0000 mg | ORAL_CAPSULE | Freq: Once | ORAL | Status: AC
  Administered 2016-08-12: 1 mg via ORAL
  Filled 2016-08-12: qty 1

## 2016-08-12 MED ORDER — ETODOLAC 200 MG PO CAPS
200.0000 mg | ORAL_CAPSULE | Freq: Two times a day (BID) | ORAL | Status: DC
  Filled 2016-08-12: qty 1

## 2016-08-12 MED ORDER — DICLOFENAC SODIUM 1 % TD GEL
4.0000 g | Freq: Four times a day (QID) | TRANSDERMAL | Status: DC
  Administered 2016-08-12 (×2): 4 g via TOPICAL
  Filled 2016-08-12: qty 100

## 2016-08-12 MED ORDER — QUETIAPINE FUMARATE 200 MG PO TABS: 200.0000 mg | ORAL_TABLET | Freq: Every day | ORAL | Status: DC

## 2016-08-12 MED ORDER — CHLORDIAZEPOXIDE HCL 25 MG PO CAPS
25.0000 mg | ORAL_CAPSULE | Freq: Four times a day (QID) | ORAL | Status: DC
  Administered 2016-08-12 – 2016-08-14 (×9): 25 mg via ORAL
  Filled 2016-08-12 (×9): qty 1

## 2016-08-12 NOTE — Progress Notes (Signed)
Pt came to this nurse complaining of tremors, some nausea, and lower back pain. PRN medication given as ordered. Pt requesting to see doctor, but she was asleep when doctor rounded and would not wake, MD informed that pt is now awake and ready to see her. Pt requesting to sign a request for discharge. Pt states that she came to the hospital voluntarily due to "Depression, my medications haven't been working and I need them changed. I need to leave because I need to go to work and pay my bills." Request for discharge form signed at 10:40am, 08/12/2016,  placed on chart, MD informed. Support and encouragement provided. Safety maintained. Will continue to monitor.

## 2016-08-12 NOTE — BHH Counselor (Signed)
Adult Comprehensive Assessment  Patient ID: Laura [name], female   DOB: [birth year], 46 y.o.   MRN: [id number]  Information Source: Information source: Patient  Current Stressors:  Educational / Learning stressors: Denies stressors Employment / Job issues: Is disabled due to back surgeries and knee - can't work. Family Relationships:  (Some family members are upset with her) Museum/gallery curator / Lack of resources (include bankruptcy): Not enough money - car needs fixing, or needs a new car really. Housing / Lack of housing:  (was living with BF, but broke up) Physical health (include injuries & life threatening diseases): Back surgeries, knee problems, chronic pain Social relationships: Has no social relationships, broke up with fiance 35 days ago.  He was supposed to have followed her down to [place]. Substance abuse: Started drinking heavily due to pain.  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 Bereavement / Loss: Just lost best friend 2 weeks ago in a car accident.  Brother and parents are deceased.  Living/Environment/Situation:  Living Arrangements: Parent, Other (Comment) (adoptive father) Living conditions (as described by patient or guardian):  (fine) How long has patient lived in current situation?: Since 04/21/16 What is atmosphere in current home: Comfortable  Family History:  Marital status: Divorced Are you sexually active?: No Does patient have children?: Yes How many children?: 2 How is patient's relationship with their children?: 81yo son and 44yo son - Everything was great until getting to [place], and things spiraled out of control, so they are upset with her right now.  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, waitress Has patient ever been in the [employer]?: No Has patient ever served in combat?: No Did You Receive Any Psychiatric Treatment/Services While in Passenger transport manager?: No Are There Guns or Chiropractor in [place]?: No Are These Psychologist, educational?:  (n/a)  Financial Resources:   Financial resources: Teacher, early years/pre, Medicare Does patient have a Programmer, applications or guardian?: No  Alcohol/Substance Abuse:   What has been your use of drugs/alcohol within the last 12 months?: Alcohol relapse If attempted suicide, did drugs/alcohol play a role in this?: No Alcohol/Substance Abuse Treatment Hx: Past detox If yes, describe treatment: 2009 Has alcohol/substance abuse ever caused legal problems?: Yes  Social Support System:   Patient's Community Support System: Good Describe Community Support System: Foster father, sons Type of faith/religion: Catholic How does patient's faith help to cope with current illness?: Doesn't really, hasn't been to church in 29 years  Leisure/Recreation:   Leisure and Hobbies: Drink, cry  Strengths/Needs:   What things does the patient do well?: Nothing right now. 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 Information was obtained from psychosocial assessment completed with patient and chart review conducted by this evaluator. Patient presented to the hospital with thoughts of self-harm with plan to overdose. Patient reports primary triggers for admission were worsening depression and relapse on alcohol. Patient plans to follow-[facility] with [facility] for outpatient services. Patient will benefit from crisis stabilization, medication evaluation, group therapy and psycho education in addition to case management for discharge. At discharge, it is recommended that patient remain compliant with established discharge plan and continued treatment.   Earline Stiner G. [place], [place] 08/12/2016 11:40 AM

## 2016-08-12 NOTE — Progress Notes (Signed)
Patient was admitted at 49 from [place] for recent Suicide attempt by ingestion of 12 Prozac and 5 Neurontin capsules while drinking one pint of alcohol.  She had one prior attempt at self harm in the past and has a current legal issue for "possesion".  On admission, she is sad, depressed and rates anxiety and depression at 10/10.  She also had a fall about two days ago and skin assessment reveals a large bruise on her right thigh, small bruises on her right knee and scabs on her right knuckles.  She also has had multiple low back surgeries and is currently on disability due to decrease function and chronic pain. Gait is steady and high fall precaution information was shared with patient. She is alert and oriented and denies SI/HI/AVH. She had moderate tremors of both hands. She is oriented to the unit and given information on medications ordered.

## 2016-08-12 NOTE — Progress Notes (Signed)
Pt alert and oriented on unit in NAD pt has verbalized no complaints pt resting in room with eyes closed. Pt noted to interact with peers approprietly pt denies HI/SI at this time denies A/V hallucinations will continue to monitor.
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
## 2016-08-12 NOTE — Plan of Care (Signed)
Problem: Safety: Goal: Ability to remain free from injury will improve Outcome: Progressing Safety maintained with every 15 minute checks.

## 2016-08-12 NOTE — Plan of Care (Signed)
Problem: Potential for Substance Withdrawal Goal: Knowledge of Patient Safety will improve Outcome: Progressing Patient verbally agrees with High Falls Risk Precautions.

## 2016-08-12 NOTE — Plan of Care (Signed)
Problem: Safety: Goal: Ability to remain free from injury will improve Outcome: Progressing Pt has be injury free on the unit and safety has been maintained.

## 2016-08-12 NOTE — [facility] Group Notes (Signed)
[facility] LCSW Group Therapy  08/12/2016 1:55 PM  Type of Therapy:  Group Therapy  Participation Level:  Active  Participation Quality:  Attentive and Sharing  Affect:  Appropriate  Cognitive:  Alert  Insight:  Engaged  Engagement in Therapy:  Improving  Modes of Intervention:  Activity, Discussion, Education, Problem-solving, Art therapist, Socialization and Support  Summary of Progress/Problems: Communications: Patients identify how individuals communicate with one another appropriately and inappropriately. Patients will be guided to discuss their thoughts, feelings, and behaviors related to barriers when communicating. The group will process together ways to execute positive and appropriate communications.   Luticia Tadros G. [place], [place] 08/12/2016, 1:57 PM

## 2016-08-12 NOTE — Progress Notes (Signed)
Pt to nurse complaining of lower abdominal pain and asking if doctor ordered ultra sound "because my hCg levels were elevated and they think I could be pregnant." Pt reports she had bowel movement this morning. Reports she hasn't had pain like this before. Denies discharge/bleeding. Pt left medication room to go watch TV "to take my mind off the pain."  MD informed. Safety maintained with every 15 minute checks. Will continue to monitor.

## 2016-08-12 NOTE — BHH Suicide Risk Assessment (Signed)
[facility] Admission Suicide Risk Assessment   Nursing information obtained from:  Patient Demographic factors:  Divorced or widowed (female) Current Mental Status:  NA Loss Factors:  Loss of significant relationship Historical Factors:  Family history of suicide Risk Reduction Factors:  Positive coping skills or problem solving skills, Sense of responsibility to family  Total Time spent with patient: 1 hour Principal Problem: Major depressive disorder, recurrent severe without psychotic features ([place]) Diagnosis:   Patient Active Problem List   Diagnosis Date Noted  . PTSD (post-traumatic stress disorder) [F43.10] 08/12/2016  . Major depressive disorder, recurrent severe without psychotic features ([place]) [F33.2] 08/11/2016  . Alcohol intoxication in active alcoholic without complication ([place]) [M54.650]   . Suicide attempt ([place]) [T14.91XA]   . Alcohol use disorder, moderate, dependence ([place]) [F10.20] 07/23/2016  . 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 Hypokalemia [E87.6] 07/23/2016  . Prolonged Q-T interval on ECG [R94.31] 07/23/2016  . MDD (major depressive disorder), recurrent severe, without psychosis ([place]) [F33.2] 07/21/2016  . Osteoarthritis of spine with radiculopathy, cervical region [facility] 07/02/2016  . Substance abuse [F19.10] 07/02/2016  . Lumbar spondylosis [M47.816] 07/02/2016   Subjective Data: suicide attempt.  Continued Clinical Symptoms:  Alcohol Use Disorder Identification Test Final Score (AUDIT): 21 The "Alcohol Use Disorders Identification Test", Guidelines for Use in Primary Care, Second Edition.  World Pharmacologist [facility]). Score between 0-7:  no or low risk or alcohol related problems. Score between 8-15:  moderate risk of alcohol related problems. Score between 16-19:  high risk of alcohol related problems. Score 20 or above:  warrants further diagnostic evaluation for  alcohol dependence and treatment.   CLINICAL FACTORS:   Severe Anxiety and/or Agitation Depression:   Comorbid alcohol abuse/dependence Impulsivity Insomnia Alcohol/Substance Abuse/Dependencies Chronic Pain Previous Psychiatric Diagnoses and Treatments Medical Diagnoses and Treatments/Surgeries   Musculoskeletal: Strength & Muscle Tone: within normal limits Gait & Station: normal Patient leans: N/A  Psychiatric Specialty Exam: Physical Exam  Nursing note and vitals reviewed. Psychiatric: Her speech is normal. Her mood appears anxious. Her affect is blunt. She is withdrawn. Cognition and memory are normal. She expresses impulsivity. She expresses suicidal ideation. She expresses suicidal plans.    Review of Systems  Musculoskeletal: Positive for back pain and neck pain.  Neurological: Positive for tremors.  Psychiatric/Behavioral: Positive for depression, substance abuse and suicidal ideas. The patient is nervous/anxious and has insomnia.   All other systems reviewed and are negative.   Blood pressure 103/75, pulse 85, temperature 98.3 F (36.8 C), temperature source Oral, resp. rate 18, height 5\' 8"  (1.727 m), weight 70.8 kg (156 lb), last menstrual period 05/09/2016.Body mass index is 23.72 kg/m.  General Appearance: Disheveled  Eye Contact:  Good  Speech:  Clear and Coherent  Volume:  Decreased  Mood:  Depressed, Hopeless and Worthless  Affect:  Tearful  Thought Process:  Goal Directed and Descriptions of Associations: Intact  Orientation:  Full (Time, Place, and Person)  Thought Content:  WDL  Suicidal Thoughts:  Yes.  with intent/plan  Homicidal Thoughts:  No  Memory:  Immediate;   Fair Recent;   Fair Remote;   Fair  Judgement:  Poor  Insight:  Lacking  Psychomotor Activity:  Psychomotor Retardation  Concentration:  Concentration: Fair and Attention Span: Fair  Recall:  [employer] of Knowledge:  Fair  Language:  Fair  Akathisia:  No  Handed:  Right  AIMS  (if indicated):     Assets:  Communication Skills Desire for Improvement Financial Resources/Insurance Housing Resilience Social Support  ADL's:  Intact  Cognition:  WNL  Sleep:  Number of Hours: 7.15      COGNITIVE FEATURES THAT CONTRIBUTE TO RISK:  None    SUICIDE RISK:   Severe:  Frequent, intense, and enduring suicidal ideation, specific plan, no subjective intent, but some objective markers of intent (i.e., choice of lethal method), the method is accessible, some limited preparatory behavior, evidence of impaired self-control, severe dysphoria/symptomatology, multiple risk factors present, and few if any protective factors, particularly a lack of social support.  PLAN OF CARE: Hospital Admission, medication management, and substance abuse counseling, discharge planning.  Ms. Suchecki is a 46 year old female with a history of depression, mood instability and alcoholism admitted after suicide attempt by overdose of gabapentin and Prozac in the context of drinking and severe social stressors.  1. Suicidal ideation. The patient is able to contract for safety in the hospital.  2. Mood. The patient responded well to Seroquel in the past. There were some concerns about QTC prolongation. QTc is 439. We will restart Seroquel.  3. Chronic pain. We started Voltaren gel and Lodine.  4. Alcohol abuse. We will start Librium taper.   5. Substance abuse treatment. The patient is not interested in residential substance abuse treatment or pharmacotherapy for alcoholism.  6. PTSD. We will offer low dose Minipress to see the patient can tolerated. She complains of nightmares and flashbacks of past abuse.  7. Elevated hCG. The patient has positive pregnancy test with hCG of 8.9. The patient did not have sexual intercourse since February 2018. Her periods have not been regular and she thinks that she is going into menopause. She is wants to start treatment as soon as possible.   8. Metabolic  syndrome monitoring. Lipid panel, TSH and hemoglobin A1c are pending.  9. EKG. Normal sinus rhythm. QTc 439.  10. Disposition. She will return home with her father. She will [facility] [place].    I certify that inpatient services furnished can reasonably be expected to improve the patient's condition.   Orson Slick, MD 08/12/2016, 11:18 AM

## 2016-08-13 MED ORDER — QUETIAPINE FUMARATE 100 MG PO TABS
100.0000 mg | ORAL_TABLET | Freq: Every day | ORAL | Status: DC
  Administered 2016-08-13: 100 mg via ORAL
  Filled 2016-08-13: qty 1

## 2016-08-13 MED ORDER — LORAZEPAM 1 MG PO TABS
1.0000 mg | ORAL_TABLET | Freq: Four times a day (QID) | ORAL | Status: DC | PRN
  Administered 2016-08-13 (×2): 1 mg via ORAL
  Filled 2016-08-13 (×2): qty 1

## 2016-08-13 MED ORDER — PRAZOSIN HCL 1 MG PO CAPS
1.0000 mg | ORAL_CAPSULE | Freq: Two times a day (BID) | ORAL | Status: DC
  Administered 2016-08-13: 1 mg via ORAL
  Filled 2016-08-13 (×2): qty 1

## 2016-08-13 NOTE — BHH Suicide Risk Assessment (Signed)
[facility] INPATIENT:  Family/Significant Other Suicide Prevention Education  Suicide Prevention Education:  Education Completed; father, Beatrix Fetters ph#: [phone] has been identified by the patient as the family member/significant other with whom the patient will be residing, and identified as the person(s) who will aid the patient in the event of a mental health crisis (suicidal ideations/suicide attempt).  With written consent from the patient, the family member/significant other has been provided the following suicide prevention education, prior to the and/or following the discharge of the patient.  The suicide prevention education provided includes the following:  Suicide risk factors  Suicide prevention and interventions  National Suicide Hotline telephone number  [facility] assessment telephone number  [facility] Emergency Assistance [place] and/or Residential Mobile Crisis Unit telephone number  Request made of family/significant other to:  Remove weapons (e.g., guns, rifles, knives), all items previously/currently identified as safety concern.    Remove drugs/medications (over-the-counter, prescriptions, illicit drugs), all items previously/currently identified as a safety concern.  The family member/significant other verbalizes understanding of the suicide prevention education information provided.  The family member/significant other agrees to remove the items of safety concern listed above.  Emilie Rutter, MSW, LCSW-A 08/13/2016, 2:11 PM

## 2016-08-13 NOTE — Progress Notes (Signed)
D: Pt visible and social with select peers.  Upon approach discusses the loss of her son and feels she will be able to cope with this upon discharge.  Feels she needs to help her younger son cope with this.  Denies s/i, h/i or hallucinations.  A: Provided support, discussed + coping skills.  Monitored on 15 minute safety checks. Maintained safety on the unit.  R: Pt cooperative with medications, discussing issues, request for d/c remains in place.

## 2016-08-13 NOTE — Progress Notes (Signed)
Recreation Therapy Notes  INPATIENT RECREATION THERAPY ASSESSMENT  Patient Details Name: Laura [name] MRN: [id number] DOB: 1970-07-11 Today's Date: 08/13/2016  Patient Stressors: Family, Relationship, Death, Friends, Work, Other (Comment) Adopted father and son do not like that the pt is drinking; break-up in March and pt is not used to being by herself and relying on her father; best friend died 3 weeks ago; lack of supportive friends; work is stressful because she has back problems and she is worried she will not have a job anymore because she was supposed to be at work on Friday and she was in the hospital; "life"  Coping Skills:   Isolate, Substance Abuse, Avoidance, Exercise, Music, Other (Comment) Warehouse manager)  Personal Challenges: Decision-Making, Expressing Yourself, Self-Esteem/Confidence, Substance Abuse, Trusting Others  Leisure Interests (2+):  Individual - Other (Comment) (Swimming, work)  Futures trader Resources:  Yes  Community Resources:  [employer]  Current Use: No  If no, Barriers?: Other (Comment) (Trying to adapt because she just moved to [place])  Patient Strengths:  Appearance, strength  Patient Identified Areas of Improvement:  Expression, not drinking  Current Recreation Participation:  Working, going to the pool  Patient Goal for Hospitalization:  To not be depressed anymore  [place] of Residence:  [place] of Residence:  [place]   Current [place] (including self-harm):  No  Current [place]:  No  Consent to Intern Participation: N/A   Leonette [facility], LRT/CTRS 08/13/2016, 3:11 PM

## 2016-08-13 NOTE — Tx Team (Signed)
Interdisciplinary Treatment and Diagnostic Plan Update  08/13/2016 Time of Session: 10:30 AM Laura [name] MRN: [id number]  Principal Diagnosis: Major depressive disorder, recurrent severe without psychotic features ([place])  Secondary Diagnoses: Principal Problem:   Major depressive disorder, recurrent severe without psychotic features ([place]) Active Problems:   Lumbar spondylosis   Alcohol use disorder, moderate, dependence ([place])   Alcohol intoxication in active alcoholic without complication ([place])   Suicide attempt ([place])   PTSD (post-traumatic stress disorder)   Current Medications:  Current Facility-Administered Medications  Medication Dose Route Frequency Provider Last Rate Last Dose  . acetaminophen (TYLENOL) tablet 650 mg  650 mg Oral Q6H PRN Pucilowska, Jolanta B, MD   650 mg at 08/12/16 1008  . alum & mag hydroxide-simeth (MAALOX/MYLANTA) 200-200-20 MG/5ML suspension 30 mL  30 mL Oral Q4H PRN Pucilowska, Jolanta B, MD      . chlordiazePOXIDE (LIBRIUM) capsule 25 mg  25 mg Oral QID Pucilowska, Jolanta B, MD   25 mg at 08/13/16 0814  . diclofenac sodium (VOLTAREN) 1 % transdermal gel 4 g  4 g Topical QID Pucilowska, Jolanta B, MD   4 g at 08/12/16 2124  . magnesium hydroxide (MILK OF MAGNESIA) suspension 30 mL  30 mL Oral Daily PRN Pucilowska, Jolanta B, MD      . nicotine (NICODERM CQ - dosed in mg/24 hours) patch 21 mg  21 mg Transdermal Q0600 Pucilowska, Jolanta B, MD   21 mg at 08/13/16 0816  . prazosin (MINIPRESS) capsule 1 mg  1 mg Oral BID Pucilowska, Jolanta B, MD      . QUEtiapine (SEROQUEL) tablet 100 mg  100 mg Oral QHS Pucilowska, Jolanta B, MD       PTA Medications: Prescriptions Prior to Admission  Medication Sig Dispense Refill Last Dose  . FLUoxetine (PROZAC) 20 MG capsule Take 1 capsule (20 mg total) by mouth daily. For depression 30 capsule 0 08/10/2016 at Unknown time  . gabapentin (NEURONTIN) 300 MG capsule Take 1 capsule (300 mg total) by mouth 3 (three)  times daily. For agitation 90 capsule 0 08/09/2016 at Unknown time  . hydrOXYzine (ATARAX/VISTARIL) 50 MG tablet Take 2 tablets (100 mg total) by mouth at bedtime as needed (sleep, please do not combine with lunesta). 30 tablet 0 08/09/2016 at Unknown time  . methocarbamol (ROBAXIN) 500 MG tablet Take 1 tablet (500 mg total) by mouth 3 (three) times daily. For muscle spasms 15 tablet 0 08/09/2016 at Unknown time  . eszopiclone (LUNESTA) 2 MG TABS tablet Take 1 tablet (2 mg total) by mouth at bedtime. (Take immediately before bedtime): For sleep (Patient not taking: Reported on 08/10/2016) 15 tablet 0 Not Taking at Unknown time  . lidocaine (LIDODERM) 5 % Place 1 patch onto the skin daily. Remove & Discard patch within 12 hours or as directed by MD: For pain management (Patient not taking: Reported on 08/10/2016) 7 patch 0 Not Taking at Unknown time  . nicotine (NICODERM CQ - DOSED IN MG/24 HOURS) 21 mg/24hr patch Place 1 patch (21 mg total) onto the skin daily. 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   Physician Treatment Plan for Primary Diagnosis: Major depressive disorder, recurrent severe  without psychotic features ([place]) Long Term Goal(s): Improvement in symptoms so as ready for discharge Improvement in symptoms so as ready for discharge   Short Term Goals: Ability to identify changes in lifestyle to reduce recurrence of condition will improve Ability to verbalize feelings will improve Ability to disclose and discuss suicidal ideas Ability to demonstrate self-control will improve Ability to  identify and develop effective coping behaviors will improve Compliance with prescribed medications will improve Ability to identify triggers associated with substance abuse/mental health issues will improve Ability to identify changes in lifestyle to reduce recurrence of condition will improve Ability to demonstrate self-control will improve Ability to identify triggers associated with substance abuse/mental health issues will improve  Medication Management: Evaluate patient's response, side effects, and tolerance of medication regimen.  Therapeutic Interventions: 1 to 1 sessions, Unit Group sessions and Medication administration.  Evaluation of Outcomes: Progressing  Physician Treatment Plan for Secondary Diagnosis: Principal Problem:   Major depressive disorder, recurrent severe without psychotic features ([place]) Active Problems:   Lumbar spondylosis   Alcohol use disorder, moderate, dependence ([facility])   Alcohol intoxication in active alcoholic without complication ([place])   Suicide attempt ([place])   PTSD (post-traumatic stress disorder)  Long Term Goal(s): Improvement in symptoms so as ready for discharge Improvement in symptoms so as ready for discharge   Short Term Goals: Ability to identify changes in lifestyle to reduce recurrence of condition will improve Ability to verbalize feelings will improve Ability to disclose and discuss suicidal ideas Ability to demonstrate self-control will improve Ability to identify and develop effective coping behaviors will improve Compliance with prescribed medications will improve Ability to identify triggers associated with substance abuse/mental health issues will improve Ability to identify changes in lifestyle to reduce recurrence of condition will improve Ability to demonstrate self-control will improve Ability to identify triggers associated with substance abuse/mental health issues will improve     Medication Management: Evaluate patient's  response, side effects, and tolerance of medication regimen.  Therapeutic Interventions: 1 to 1 sessions, Unit Group sessions and Medication administration.  Evaluation of Outcomes: Progressing   RN Treatment Plan for Primary Diagnosis: Major depressive disorder, recurrent severe without psychotic features ([place]) Long Term Goal(s): Knowledge of disease and therapeutic regimen to maintain health will improve  Short Term Goals: Ability to demonstrate self-control, Ability to disclose and discuss suicidal ideas and Compliance with prescribed medications will improve  Medication Management: RN will administer medications as ordered by provider, will assess and evaluate patient's response and provide education to patient for prescribed medication. RN will report any adverse and/or side effects to prescribing provider.  Therapeutic Interventions: 1 on 1 counseling sessions, Psychoeducation, Medication administration, Evaluate responses to treatment, Monitor vital signs and CBGs as ordered, Perform/monitor CIWA, COWS, AIMS and Fall Risk screenings as ordered, Perform wound care treatments as ordered.  Evaluation of Outcomes: Progressing   LCSW Treatment Plan for Primary Diagnosis: Major depressive disorder, recurrent severe without psychotic features ([place]) Long Term Goal(s): Safe transition to appropriate next level of care at discharge, Engage patient in therapeutic group addressing interpersonal concerns.  Short Term Goals: Engage patient in aftercare planning with referrals and resources, Increase social support and Identify triggers associated with mental health/substance abuse issues  Therapeutic Interventions: Assess for all discharge needs, 1 to 1 time with Social worker, Explore available resources and support systems, Assess for adequacy in community support network, Educate family and significant other(s) on suicide prevention, Complete Psychosocial Assessment, Interpersonal group  therapy.  Evaluation of Outcomes: Progressing  Recreational Therapy Treatment Plan for Primary Diagnosis: Major depressive disorder, recurrent severe without psychotic features ([place]) Long Term Goal(s): Patient will participate in recreation therapy treatment in at least 2 group sessions without prompting from LRT  Short Term Goals: Increase self-esteem, Increase healthy coping skills  Treatment Modalities: Group Therapy and Individual Treatment Sessions  Therapeutic Interventions: Psychoeducation  Evaluation of Outcomes: Progressing   Progress in Treatment: Attending groups: Yes. Participating in groups: Yes. Taking medication as prescribed: Yes. Toleration medication: Yes. Family/Significant other contact made: No, will contact:  CSW assessing proper contacts Patient understands diagnosis: Yes. Discussing patient identified problems/goals with staff: Yes. Medical problems stabilized or resolved: Yes. Denies suicidal/homicidal ideation: Yes. Issues/concerns per patient self-inventory: No.  New problem(s) identified: No, Describe:  None identified.  New Short Term/Long Term Goal(s): Patient stated that her goal is to get medications and discharge home to continue outpatient care.  Discharge Plan or Barriers: Patient will return home and follow-up with [facility].  Reason for Continuation of Hospitalization: Depression Suicidal ideation Withdrawal symptoms  Estimated Length of Stay: 1-2 days   Attendees: Patient: Laura [name]  08/13/2016 11:04 AM  Physician: Dr. Queen Slough, MD  08/13/2016 11:04 AM  Nursing: Elige Radon, BSN, RN 08/13/2016 11:04 AM  RN Care Manager: 08/13/2016 11:04 AM  Social Worker: Glorious Peach, MSW, LCSW-A 08/13/2016 11:04 AM  Recreational Therapist: Drue Flirt, LRT/CTRS  08/13/2016 11:04 AM  Other:  08/13/2016 11:04 AM  Other:  08/13/2016 11:04 AM  Other: 08/13/2016 11:04 AM    Scribe for Treatment Team: Emilie Rutter,  [place] 08/13/2016 11:04 AM
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
## 2016-08-13 NOTE — Plan of Care (Signed)
Problem: Safety: Goal: Ability to remain free from injury will improve Outcome: Progressing Pt free of injury, safety maintained with every 15 minute checks. Will continue to monitor.

## 2016-08-13 NOTE — Progress Notes (Signed)
Pt to nurse requesting discharge, tearful. Pt anxious related to disruption of milieu by peer. Pt also with tremors related to alcohol withdrawal. Spoke with Dr. Bary Leriche via telephone, order obtained for ativan 1mg  PO every 6 hours PRN for anxiety. Support and encouragement provided. Safety maintained. Will continue to monitor.

## 2016-08-13 NOTE — BHH Group Notes (Signed)
[place] LCSW Group Therapy Note  Date/Time: 08/13/16, 0930  Type of Therapy and Topic:  Group Therapy:  Overcoming Obstacles  Participation Level:  active  Description of Group:    In this group patients will be encouraged to explore what they see as obstacles to their own wellness and recovery. They will be guided to discuss their thoughts, feelings, and behaviors related to these obstacles. The group will process together ways to cope with barriers, with attention given to specific choices patients can make. Each patient will be challenged to identify changes they are motivated to make in order to overcome their obstacles. This group will be process-oriented, with patients participating in exploration of their own experiences as well as giving and receiving support and challenge from other group members.  Therapeutic Goals: 1. Patient will identify personal and current obstacles as they relate to admission. 2. Patient will identify barriers that currently interfere with their wellness or overcoming obstacles.  3. Patient will identify feelings, thought process and behaviors related to these barriers. 4. Patient will identify two changes they are willing to make to overcome these obstacles:    Summary of Patient Progress: Pt shared with the group that current obstacles she was facing included relationship problems, health problems (back issues), alcohol, and lack of support.  Pt participated in a discussion about steps she could take to work to overcome these obstacles and was a good participant throughout.      Therapeutic Modalities:   Cognitive Behavioral Therapy Solution Focused Therapy Motivational Interviewing Relapse Prevention Therapy  Lurline Idol, LCSW
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
## 2016-08-14 MED ORDER — QUETIAPINE FUMARATE 300 MG PO TABS: 300.0000 mg | ORAL_TABLET | Freq: Every day | ORAL | 1 refills | Status: DC

## 2016-08-14 MED ORDER — PRAZOSIN HCL 1 MG PO CAPS: 1.0000 mg | ORAL_CAPSULE | Freq: Two times a day (BID) | ORAL | 1 refills | Status: DC

## 2016-08-14 MED ORDER — QUETIAPINE FUMARATE 50 MG PO TABS: 50.0000 mg | ORAL_TABLET | Freq: Three times a day (TID) | ORAL | 1 refills | Status: DC

## 2016-08-14 MED ORDER — QUETIAPINE FUMARATE 200 MG PO TABS: 300.0000 mg | ORAL_TABLET | Freq: Every day | ORAL | Status: DC

## 2016-08-14 MED ORDER — QUETIAPINE FUMARATE 25 MG PO TABS
50.0000 mg | ORAL_TABLET | Freq: Three times a day (TID) | ORAL | Status: DC
  Administered 2016-08-14: 50 mg via ORAL
  Filled 2016-08-14: qty 2

## 2016-08-14 NOTE — Progress Notes (Signed)
  [facility] Adult Case Management Discharge Plan :  Will you be returning to the same living situation after discharge:  Yes,  return home. At discharge, do you have transportation home?: Yes,  family will pick patient up. Do you have the ability to pay for your medications: Yes,  Medicare.  Release of information consent forms completed and in the chart;  Patient's signature needed at discharge.  Patient to Follow up at: Follow-up Information    [facility]. Go on 08/16/2016.   Specialty:  Behavioral Health Why:  Please arrive to [facility] for your hospital discharge appointment M-F between 8AM-2:30PM for medication management and therapy. It is important that you arrive as early as possible for prompt services. If you have questions or concerns, contact [facility]. Contact information: [place] [address] [phone]           Next level of care provider has access to [place] and Suicide Prevention discussed: Yes,  SPE completed with father.  Have you used any form of tobacco in the last 30 days? (Cigarettes, Smokeless Tobacco, Cigars, and/or Pipes): Yes  Has patient been referred to the Quitline?: Patient refused referral  Patient has been referred for addiction treatment: Pt. refused referral  Emilie Rutter, MSW, LCSW-A 08/14/2016, 10:11 AM

## 2016-08-14 NOTE — Progress Notes (Signed)
Pt appeared to sleep 8.5 hours while monitored on 15 minute safety checks.

## 2016-08-14 NOTE — BHH Suicide Risk Assessment (Signed)
[facility] Discharge Suicide Risk Assessment   Principal Problem: Major depressive disorder, recurrent severe without psychotic features [facility]) Discharge Diagnoses:  Patient Active Problem List   Diagnosis Date Noted  . PTSD (post-traumatic stress disorder) [F43.10] 08/12/2016  . Major depressive disorder, recurrent severe without psychotic features ([place]) [F33.2] 08/11/2016  . Alcohol intoxication in active alcoholic without complication ([place]) [K09.381]   . Suicide attempt ([place]) [T14.91XA]   . Alcohol use disorder, moderate, dependence ([place]) [F10.20] 07/23/2016  . 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 Hypokalemia [E87.6] 07/23/2016  . Prolonged Q-T interval on ECG [R94.31] 07/23/2016  . MDD (major depressive disorder), recurrent severe, without psychosis ([place]) [F33.2] 07/21/2016  . Osteoarthritis of spine with radiculopathy, cervical region [facility] 07/02/2016  . Substance abuse [F19.10] 07/02/2016  . Lumbar spondylosis [M47.816] 07/02/2016    Total Time spent with patient: 30 minutes  Musculoskeletal: Strength & Muscle Tone: within normal limits Gait & Station: normal Patient leans: N/A  Psychiatric Specialty Exam: Review of Systems  Psychiatric/Behavioral: Positive for substance abuse.  All other systems reviewed and are negative.   Blood pressure (!) 89/57, pulse 79, temperature 98 F (36.7 C), resp. rate 18, height 5\' 8"  (1.727 m), weight 70.8 kg (156 lb), last menstrual period 05/09/2016.Body mass index is 23.72 kg/m.  General Appearance: Casual  Eye Contact::  Good  Speech:  Clear and Coherent409  Volume:  Normal  Mood:  Anxious  Affect:  Appropriate  Thought Process:  Goal Directed and Descriptions of Associations: Intact  Orientation:  Full (Time, Place, and Person)  Thought Content:  WDL  Suicidal Thoughts:  No  Homicidal Thoughts:  No  Memory:  Immediate;   Fair Recent;    Fair Remote;   Fair  Judgement:  Impaired  Insight:  Present  Psychomotor Activity:  Normal  Concentration:  Fair  Recall:  [employer] of Knowledge:Fair  Language: Fair  Akathisia:  No  Handed:  Right  AIMS (if indicated):     Assets:  Communication Skills Desire for Improvement Financial Resources/Insurance Housing Resilience Social Support  Sleep:  Number of Hours: 8.5  Cognition: WNL  ADL's:  Intact   Mental Status Per Nursing Assessment::   On Admission:  NA  Demographic Factors:  Divorced or widowed and Caucasian  Loss Factors: Loss of significant relationship and Financial problems/change in socioeconomic status  Historical Factors: Prior suicide attempts, Family history of mental illness or substance abuse and Impulsivity  Risk Reduction Factors:   Responsible for children under 43 years of age, Employed, Living with another person, especially a relative and Positive social support  Continued Clinical Symptoms:  Depression:   Comorbid alcohol abuse/dependence Impulsivity Insomnia Alcohol/Substance Abuse/Dependencies  Cognitive Features That Contribute To Risk:  None    Suicide Risk:  Minimal: No identifiable suicidal ideation.  Patients presenting with no risk factors but with morbid ruminations; may be classified as minimal risk based on the severity of the depressive symptoms    Plan Of Care/Follow-up recommendations:  Activity:  as tolerated. Diet:  low sodium heart healthy. Other:  keep follow up appointments.  Orson Slick, MD 08/14/2016, 9:06 AM

## 2016-08-14 NOTE — Progress Notes (Signed)
D: Patient is aware of  Discharge this shift .Patient denies suicidal /homicidal ideations. Patient received all belongings brought in  A: No Storage medications. Writer reviewed Discharge Summary, Suicide Risk Assessment, and Transitional Record. Patient also received Prescriptions   from  MD. A 7 day supply of medications given to patient A: Writer instructed on discharge criteria  .  Aware  Of follow up appointment . R: Patient left unit with no questions  Or concerns  With family member
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
## 2016-08-14 NOTE — [facility] Group Notes (Signed)
[facility] Group Notes:  (Nursing/MHT/Case Management/Adjunct)  Date:  08/14/2016  Time:  4:46 AM  Type of Therapy:  Group Therapy  Participation Level:  Active  Participation Quality:  Sharing  Affect:  Appropriate and Tearful  Cognitive:  Appropriate  Insight:  Appropriate  Engagement in Group:  Engaged  Modes of Intervention:  Support  Summary of Progress/Problems:Pt. Said her goal was to get some rest. However she felt the news she received about her sons passing would disrupt that. Pt. Stated that she feels the coping skills and the support from fellow pt's will help her get through this time. But she was fearing how things will be when she is discharged. Staff reassured pt. To lean on her support system and take her time while processing everything.   Jenetta Downer [place] 08/14/2016, 4:46 AM

## 2016-08-14 NOTE — Plan of Care (Signed)
Problem: [facility] Participation in Recreation Therapeutic Interventions Goal: STG-Patient will demonstrate improved self esteem by identif STG: Self-Esteem - Within 3 treatment sessions, patient will verbalize at least 5 positive affirmation statements in one treatment session to increase self-esteem.  Outcome: Completed/Met Date Met: 08/14/16 Treatment Session 1; Completed 1 out of 1: At approximately 10:15 am, LRT met with patient in consultation room. Patient verbalized 5 positive affirmation statements. Patient reported it felt "okay". LRT encouraged patient to continue saying positive affirmation statements.  Leonette Monarch, LRT/CTRS 05.29.18 1:01 pm Goal: STG-Patient will identify at least five coping skills for ** STG: Coping Skills - Within 3 treatment sessions, patient will verbalize at least 5 coping skills for substance abuse in one treatment session to decrease substance abuse.  Outcome: Completed/Met Date Met: 08/14/16 Treatment Session 1; Completed 1 out of 1: At approximately 10:15 am, LRT met with patient in consultation room. Patient verbalized 5 coping skills for substance abuse. LRT educated patient on leisure and why it is important to implement it into her schedule. LRT educated and provided patient with blank schedules to help her plan her day and try to avoid using substances. LRT educated patient on healthy support systems.  Leonette Monarch, LRT/CTRS 05.29.18 1:03 pm

## 2016-08-14 NOTE — Progress Notes (Signed)
Recreation Therapy Notes  INPATIENT RECREATION TR PLAN  Patient Details Name: Laura [name] MRN: [id number] DOB: Oct 10, 1970 Today's Date: 08/14/2016  Rec Therapy Plan Is patient appropriate for Therapeutic Recreation?: Yes Treatment times per week: At least once a week TR Treatment/Interventions: 1:1 session, [name] participation (Comment) (Appropriate participation in daily recreational therapy tx)  Discharge Criteria Pt will be discharged from therapy if:: Treatment goals are met, Discharged Treatment plan/goals/alternatives discussed and agreed upon by:: Patient/family  Discharge Summary Short term goals set: See Care Plan Short term goals met: Complete Progress toward goals comments: One-to-one attended One-to-one attended: Self-esteem, coping skills Reason goals not met: N/A Therapeutic equipment acquired: None Reason patient discharged from therapy: Discharge from hospital Pt/family agrees with progress & goals achieved: Yes Date patient discharged from therapy: 08/14/16   Leonette Monarch, LRT/CTRS 08/14/2016, 3:46 PM

## 2016-08-21 DIAGNOSIS — F29 Unspecified psychosis not due to a substance or known physiological condition: Secondary | ICD-10-CM | POA: Diagnosis not present

## 2016-08-21 DIAGNOSIS — R45851 Suicidal ideations: Secondary | ICD-10-CM | POA: Diagnosis not present

## 2016-08-23 ENCOUNTER — Other Ambulatory Visit: Payer: Self-pay | Admitting: Sports Medicine

## 2016-08-24 ENCOUNTER — Telehealth: Payer: Self-pay | Admitting: Family Medicine

## 2016-08-24 NOTE — Telephone Encounter (Signed)
I do not see a recent prescription for prozac, only for cymbalta. I am happy to refill this x 30 days. Otherwise needs an office visit to discuss medication change.  Inda Coke, PA-C

## 2016-08-24 NOTE — Telephone Encounter (Signed)
**  Remind patient they can make refill requests via MyChart**  Medication refill request (Name & Dosage):  prozac  Preferred pharmacy (Name & Address):  [facility] [phone] [address], [place] [phone] (Phone) [phone] (Fax)    Other comments (if applicable):   Pharmacy called to get the medication filled for the patient. I did not see the medication in the patient's chart.

## 2016-08-24 NOTE — Telephone Encounter (Signed)
Spoke with pharmacy and they could not tell me if prescription was requested by patient. I explained that patient would need to be seen for this prescription. Did not call patient since she did not request medication.

## 2016-08-24 NOTE — Telephone Encounter (Signed)
Please advise on refill. I do not see where Laura [name] has prescribed this.

## 2016-09-08 DIAGNOSIS — N3001 Acute cystitis with hematuria: Secondary | ICD-10-CM | POA: Diagnosis not present

## 2016-09-08 DIAGNOSIS — R109 Unspecified abdominal pain: Secondary | ICD-10-CM | POA: Diagnosis not present

## 2016-09-08 DIAGNOSIS — N390 Urinary tract infection, site not specified: Secondary | ICD-10-CM | POA: Diagnosis not present

## 2016-09-13 ENCOUNTER — Telehealth: Payer: Self-pay | Admitting: Family Medicine

## 2016-09-13 NOTE — Telephone Encounter (Signed)
[facility] called and left message for patient on 05/17, 06/01 and 06/13 and patient has not returned call.

## 2016-10-14 ENCOUNTER — Other Ambulatory Visit: Payer: Self-pay

## 2016-10-16 DIAGNOSIS — M5412 Radiculopathy, cervical region: Secondary | ICD-10-CM | POA: Diagnosis not present

## 2016-10-16 DIAGNOSIS — F411 Generalized anxiety disorder: Secondary | ICD-10-CM | POA: Diagnosis not present

## 2016-10-16 DIAGNOSIS — M545 Low back pain: Secondary | ICD-10-CM | POA: Diagnosis not present

## 2016-10-16 DIAGNOSIS — G8929 Other chronic pain: Secondary | ICD-10-CM | POA: Diagnosis not present

## 2016-10-21 DIAGNOSIS — R079 Chest pain, unspecified: Secondary | ICD-10-CM | POA: Diagnosis not present

## 2016-10-21 DIAGNOSIS — R4182 Altered mental status, unspecified: Secondary | ICD-10-CM | POA: Diagnosis not present

## 2016-10-24 DIAGNOSIS — E039 Hypothyroidism, unspecified: Secondary | ICD-10-CM | POA: Diagnosis not present

## 2016-10-24 DIAGNOSIS — R946 Abnormal results of thyroid function studies: Secondary | ICD-10-CM | POA: Diagnosis not present

## 2016-10-24 DIAGNOSIS — E7220 Disorder of urea cycle metabolism, unspecified: Secondary | ICD-10-CM | POA: Diagnosis not present

## 2016-10-31 ENCOUNTER — Emergency Department (HOSPITAL_COMMUNITY)
Admission: EM | Admit: 2016-10-31 | Discharge: 2016-11-01 | Disposition: A | Discharge status: Home / Self Care | Payer: Medicare Other | Attending: Emergency Medicine | Admitting: Emergency Medicine

## 2016-10-31 ENCOUNTER — Encounter (HOSPITAL_COMMUNITY): Payer: Self-pay | Admitting: Emergency Medicine

## 2016-10-31 DIAGNOSIS — F10220 Alcohol dependence with intoxication, uncomplicated: Secondary | ICD-10-CM | POA: Diagnosis present

## 2016-10-31 DIAGNOSIS — F329 Major depressive disorder, single episode, unspecified: Secondary | ICD-10-CM

## 2016-10-31 DIAGNOSIS — F32A Depression, unspecified: Secondary | ICD-10-CM

## 2016-10-31 DIAGNOSIS — R45851 Suicidal ideations: Secondary | ICD-10-CM | POA: Diagnosis not present

## 2016-10-31 DIAGNOSIS — Y908 Blood alcohol level of 240 mg/100 ml or more: Secondary | ICD-10-CM | POA: Insufficient documentation

## 2016-10-31 DIAGNOSIS — F332 Major depressive disorder, recurrent severe without psychotic features: Secondary | ICD-10-CM | POA: Diagnosis not present

## 2016-10-31 DIAGNOSIS — F10920 Alcohol use, unspecified with intoxication, uncomplicated: Secondary | ICD-10-CM

## 2016-10-31 DIAGNOSIS — F1020 Alcohol dependence, uncomplicated: Secondary | ICD-10-CM | POA: Diagnosis present

## 2016-10-31 LAB — CBC WITH DIFFERENTIAL/PLATELET
Basophils Absolute: 0.1 10*3/uL (ref 0.0–0.1)
Basophils Relative: 1 %
Eosinophils Absolute: 0.2 10*3/uL (ref 0.0–0.7)
Eosinophils Relative: 2 %
HCT: 38.1 % (ref 36.0–46.0)
Hemoglobin: 12.8 g/dL (ref 12.0–15.0)
Lymphocytes Relative: 46 %
Lymphs Abs: 4.6 10*3/uL — ABNORMAL HIGH (ref 0.7–4.0)
MCH: 32.2 pg (ref 26.0–34.0)
MCHC: 33.6 g/dL (ref 30.0–36.0)
MCV: 95.7 fL (ref 78.0–100.0)
Monocytes Absolute: 0.6 10*3/uL (ref 0.1–1.0)
Monocytes Relative: 5 %
Neutro Abs: 4.7 10*3/uL (ref 1.7–7.7)
Neutrophils Relative %: 46 %
Platelets: 450 10*3/uL — ABNORMAL HIGH (ref 150–400)
RBC: 3.98 MIL/uL (ref 3.87–5.11)
RDW: 13.4 % (ref 11.5–15.5)
WBC: 10.1 10*3/uL (ref 4.0–10.5)

## 2016-10-31 LAB — COMPREHENSIVE METABOLIC PANEL
ALT: 46 U/L (ref 14–54)
AST: 42 U/L — ABNORMAL HIGH (ref 15–41)
Albumin: 3.8 g/dL (ref 3.5–5.0)
Alkaline Phosphatase: 89 U/L (ref 38–126)
Anion gap: 12 (ref 5–15)
BUN: 7 mg/dL (ref 6–20)
CO2: 24 mmol/L (ref 22–32)
Calcium: 9.5 mg/dL (ref 8.9–10.3)
Chloride: 111 mmol/L (ref 101–111)
Creatinine, Ser: 0.67 mg/dL (ref 0.44–1.00)
GFR calc Af Amer: >60 mL/min (ref >60)
GFR calc non Af Amer: >60 mL/min (ref >60)
Glucose, Bld: 104 mg/dL — ABNORMAL HIGH (ref 65–99)
Potassium: 3.8 mmol/L (ref 3.5–5.1)
Sodium: 147 mmol/L — ABNORMAL HIGH (ref 135–145)
Total Bilirubin: 0.5 mg/dL (ref 0.3–1.2)
Total Protein: 7.5 g/dL (ref 6.5–8.1)

## 2016-10-31 LAB — I-STAT BETA HCG BLOOD, ED (MC, WL, AP ONLY): I-stat hCG, quantitative: <5 m[IU]/mL (ref <5)

## 2016-10-31 LAB — ACETAMINOPHEN LEVEL: Acetaminophen (Tylenol), Serum: <10 ug/mL — ABNORMAL LOW (ref 10–30)

## 2016-10-31 LAB — SALICYLATE LEVEL: Salicylate Lvl: <7 mg/dL (ref 2.8–30.0)

## 2016-10-31 LAB — ETHANOL: Alcohol, Ethyl (B): 324 mg/dL (ref <5)

## 2016-10-31 MED ORDER — SODIUM CHLORIDE 0.9 % IV BOLUS (SEPSIS)
1000.0000 mL | Freq: Once | INTRAVENOUS | Status: AC
  Administered 2016-10-31: 1000 mL via INTRAVENOUS

## 2016-10-31 NOTE — ED Provider Notes (Signed)
[facility] DEPT Provider Note   CSN: [id number] Arrival date & time: 10/31/16  2000     History   Chief Complaint Chief Complaint  Patient presents with  . Alcohol Intoxication    HPI Laura [name] is a 46 y.o. female with history of anxiety and substance abuse, MDD, PTSD who presents today with chief complaint of acute alcohol intoxication and possible suicide attempt. Patient's son called EMS to bring her to the hospital because she has been on a "bender of alcohol". She appears intoxicated The ED. She does endorse suicidal ideation and states that her son died of drowning 2 months ago and has wanted to kill herself since then. She denies any plan, however chart review shows multiple suicide attempts with acute ingestion of different medications. She states she took 3 Seroquel tablets today. She states that last heroin use was "days ago". She is unsure of how much or what type of alcohol she has had but states "I drink anything I can get my hands". She denies homicidal ideation, auditory or visual hallucinations, or any medical complaints at this time.   The history is provided by the patient.    Past Medical History:  Diagnosis Date  . Anxiety   . Substance abuse     Patient Active Problem List   Diagnosis Date Noted  . PTSD (post-traumatic stress disorder) 08/12/2016  . Major depressive disorder, recurrent severe without psychotic features ([place]) 08/11/2016  . Alcohol intoxication in active alcoholic without complication ([place])   . Suicide attempt ([place])   . Alcohol use disorder, severe, dependence ([place]) 07/23/2016  . 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 Hypokalemia 07/23/2016  . Prolonged Q-T interval on ECG 07/23/2016  . MDD (major depressive disorder), recurrent severe, without psychosis ([place]) 07/21/2016  . Osteoarthritis of spine with radiculopathy, cervical region 07/02/2016  . Substance abuse  07/02/2016  . Lumbar spondylosis 07/02/2016    Past Surgical History:  Procedure Laterality Date  . BACK SURGERY    . KNEE SURGERY      OB History    No data available       Home Medications    Prior to Admission medications   Medication Sig Start Date End Date Taking? Authorizing Provider  buPROPion (WELLBUTRIN SR) 150 MG 12 hr tablet Take 150 mg by mouth daily. 09/05/16   [provider]  busPIRone (BUSPAR) 15 MG tablet Take 15 mg by mouth 2 (two) times daily. 09/07/16   [provider]  doxepin (SINEQUAN) 100 MG capsule Take 100 mg by mouth at bedtime. 09/12/16   [provider]  FLUoxetine (PROZAC) 40 MG capsule Take 40 mg by mouth daily. 10/03/16   [provider]  gabapentin (NEURONTIN) 400 MG capsule Take 400 mg by mouth at bedtime. 09/16/16   [provider]  LORazepam (ATIVAN) 1 MG tablet Take 0.5-1 mg by mouth 2 (two) times daily as needed for sleep or anxiety. 10/16/16   [provider]  meloxicam (MOBIC) 7.5 MG tablet Take 7.5 mg by mouth every 12 (twelve) hours. 09/25/16   [provider]  mirtazapine (REMERON) 15 MG tablet Take 15 mg by mouth at bedtime. 09/05/16   [provider]  prazosin (MINIPRESS) 1 MG capsule Take 1 capsule (1 mg total) by mouth 2 (two) times daily. 08/14/16   Pucilowska, Herma Ard B, MD  QUEtiapine (SEROQUEL) 200 MG tablet Take 200 mg by mouth 2 (two) times daily with breakfast and lunch.  09/18/16   [provider]  QUEtiapine (SEROQUEL) 300 MG tablet Take 1 tablet (300 mg total) by mouth at bedtime. Patient not taking: Reported on 11/01/2016 08/14/16   Pucilowska, Herma Ard B, MD  QUEtiapine (SEROQUEL) 400 MG tablet Take 400 mg by mouth at bedtime. 10/09/16   [provider]  QUEtiapine (SEROQUEL) 50 MG tablet Take 1 tablet (50 mg total) by mouth 3 (three) times daily. Patient not taking: Reported on 11/01/2016 08/14/16   Pucilowska, Herma Ard B, MD  risperiDONE (RISPERDAL) 1  MG tablet Take 1 mg by mouth 2 (two) times daily. 09/13/16   [provider]    Family History Family History  Problem Relation Age of Onset  . Family history unknown: Yes    Social History Social History  Substance Use Topics  . Smoking status: Current Every Day Smoker    Packs/day: 1.00  . Smokeless tobacco: Never Used  . Alcohol use Yes     Comment: 3-4 pints a day     Allergies   Patient has no known allergies.   Review of Systems Review of Systems  Respiratory: Negative for shortness of breath.   Cardiovascular: Negative for chest pain.  Gastrointestinal: Negative for abdominal pain.  Psychiatric/Behavioral: Positive for suicidal ideas. Negative for hallucinations and self-injury.  All other systems reviewed and are negative.    Physical Exam Updated Vital Signs BP 110/74 (BP Location: Right Arm)   Pulse 66   Resp 16   SpO2 99%   Physical Exam  Constitutional: She is oriented to person, place, and time. She appears well-developed and well-nourished. No distress.  HENT:  Head: Normocephalic and atraumatic.  Eyes: Conjunctivae are normal. Right eye exhibits no discharge. Left eye exhibits no discharge.  Neck: Normal range of motion. No JVD present. No tracheal deviation present.  Cardiovascular: Normal rate, regular rhythm, normal heart sounds and intact distal pulses.   Pulmonary/Chest: Effort normal and breath sounds normal.  Abdominal: Soft. Bowel sounds are normal. She exhibits no distension. There is no tenderness.  Musculoskeletal: Normal range of motion. She exhibits no edema or tenderness.  Neurological: She is alert and oriented to person, place, and time. No cranial nerve deficit or sensory deficit.  Slurred speech, but answers questions appropriately and is alert and oriented. Speech is fluent and there is no facial droop.  Skin: Skin is warm and dry. No erythema.  Psychiatric: Her speech is slurred. She expresses impulsivity and  inappropriate judgment. She exhibits a depressed mood. She expresses suicidal ideation. She expresses no homicidal ideation.  Nursing note and vitals reviewed.    ED Treatments / Results  Labs (all labs ordered are listed, but only abnormal results are displayed) Labs Reviewed  COMPREHENSIVE METABOLIC PANEL - Abnormal; Notable for the following:       Result Value   Sodium 147 (*)    Glucose, Bld 104 (*)    AST 42 (*)    All other components within normal limits  ETHANOL - Abnormal; Notable for the following:    Alcohol, Ethyl (B) 324 (*)    All other components within normal limits  CBC WITH DIFFERENTIAL/PLATELET - Abnormal; Notable for the following:    Platelets 450 (*)    Lymphs Abs 4.6 (*)    All other components within normal limits  ACETAMINOPHEN LEVEL - Abnormal; Notable for the following:    Acetaminophen (Tylenol), Serum <10 (*)    All other components within normal limits  RAPID URINE DRUG SCREEN, HOSP PERFORMED  SALICYLATE LEVEL  I-STAT BETA HCG BLOOD, ED ([facility], [facility], [facility] ONLY)    EKG  EKG Interpretation None       Radiology No results found.  Procedures Procedures (including critical care time)  Medications Ordered in ED Medications  LORazepam (ATIVAN) injection 0-4 mg (0 mg Intravenous Not Given 11/01/16 1228)    Or  LORazepam (ATIVAN) tablet 0-4 mg ( Oral See Alternative 11/01/16 1228)  LORazepam (ATIVAN) injection 0-4 mg (not administered)    Or  LORazepam (ATIVAN) tablet 0-4 mg (not administered)  thiamine (VITAMIN B-1) tablet 100 mg (100 mg Oral Given 11/01/16 1124)    Or  thiamine (B-1) injection 100 mg ( Intravenous See Alternative 11/01/16 1124)  gabapentin (NEURONTIN) capsule 300 mg (not administered)  FLUoxetine (PROZAC) capsule 20 mg (not administered)  sodium chloride 0.9 % bolus 1,000 mL (0 mLs Intravenous Stopped 11/01/16 0010)  sodium chloride 0.9 % bolus 1,000 mL (0 mLs Intravenous Stopped 11/01/16 0200)     Initial Impression /  Assessment and Plan / ED Course  I have reviewed the triage vital signs and the nursing notes.  Pertinent labs & imaging results that were available during my care of the patient were reviewed by me and considered in my medical decision making (see chart for details).     Patient presents with alcohol intoxication and suicidal ideation. Afebrile, has been slightly hypotensive while in the ED but this appears to be near her baseline and she improved with administration of fluids and sitting upright. Lab work and physical examination is reassuring. Poison control was contacted and recommended obtaining EKG to check for QT interval which is not prolonged while in the ED today. She is medically cleared from our standpoint for TTS evaluation.  Of note, patient is here voluntarily at this time. If she attempts to leave, she may require IVC due to suicidal ideations.   Final Clinical Impressions(s) / ED Diagnoses   Final diagnoses:  Alcoholic intoxication without complication ([place])  Depression, unspecified depression type  Major depressive disorder, recurrent severe without psychotic features [facility])    New Prescriptions New Prescriptions   No medications on file     Debroah Baller 11/01/16 1455    Duffy Bruce, MD 11/02/16 [phone]

## 2016-10-31 NOTE — ED Notes (Signed)
While this RN walked by patient's room, patient was standing by the door without pants on peeing in the floor.  This RN asked patient why she did not want to use the toilet and pt stated "I didn't feel like it".

## 2016-10-31 NOTE — BHH Counselor (Addendum)
Writer spoke with EDP, [place], [place] about document and UDS, prior to completion of consult for Patient. PA reported documentation and UDS were pending.  Unable to be assessed due to Ethanol 324 mg/dL at 2051.  Leroy Sea, [facility] Triage Specialist [phone]

## 2016-10-31 NOTE — ED Triage Notes (Signed)
Pt comes to ed via, ems, pt is ETOH been on a binder. Pt also taking Seroquel. Pt not able to care for self.  Alert x ETOH. Vs on arrival 111/89, pulse 97, rr 14, sp02 95, cbg 110. Pt comes from home. Family concerned about her well being. Son called ems.   Home address [address]
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
## 2016-10-31 NOTE — ED Notes (Signed)
Bed: [facility]02 Expected date:  Expected time:  Means of arrival:  Comments: 40 f Etoh

## 2016-11-01 ENCOUNTER — Inpatient Hospital Stay (HOSPITAL_COMMUNITY)
Admission: AD | Admit: 2016-11-01 | Discharge: 2016-11-05 | DRG: 885 | Disposition: A | Discharge status: Home / Self Care | Payer: Medicare Other | Attending: Psychiatry | Admitting: Psychiatry

## 2016-11-01 ENCOUNTER — Encounter (HOSPITAL_COMMUNITY): Payer: Self-pay

## 2016-11-01 DIAGNOSIS — Z560 Unemployment, unspecified: Secondary | ICD-10-CM | POA: Diagnosis not present

## 2016-11-01 DIAGNOSIS — R45851 Suicidal ideations: Secondary | ICD-10-CM | POA: Diagnosis present

## 2016-11-01 DIAGNOSIS — M47816 Spondylosis without myelopathy or radiculopathy, lumbar region: Secondary | ICD-10-CM | POA: Diagnosis present

## 2016-11-01 DIAGNOSIS — G4700 Insomnia, unspecified: Secondary | ICD-10-CM | POA: Diagnosis not present

## 2016-11-01 DIAGNOSIS — Z79899 Other long term (current) drug therapy: Secondary | ICD-10-CM

## 2016-11-01 DIAGNOSIS — F17210 Nicotine dependence, cigarettes, uncomplicated: Secondary | ICD-10-CM | POA: Diagnosis present

## 2016-11-01 DIAGNOSIS — Z915 Personal history of self-harm: Secondary | ICD-10-CM | POA: Diagnosis not present

## 2016-11-01 DIAGNOSIS — F10220 Alcohol dependence with intoxication, uncomplicated: Secondary | ICD-10-CM | POA: Diagnosis not present

## 2016-11-01 DIAGNOSIS — Z599 Problem related to housing and economic circumstances, unspecified: Secondary | ICD-10-CM | POA: Diagnosis not present

## 2016-11-01 DIAGNOSIS — F332 Major depressive disorder, recurrent severe without psychotic features: Principal | ICD-10-CM | POA: Diagnosis present

## 2016-11-01 DIAGNOSIS — F10239 Alcohol dependence with withdrawal, unspecified: Secondary | ICD-10-CM | POA: Diagnosis not present

## 2016-11-01 DIAGNOSIS — Z716 Tobacco abuse counseling: Secondary | ICD-10-CM | POA: Diagnosis not present

## 2016-11-01 DIAGNOSIS — Z62811 Personal history of psychological abuse in childhood: Secondary | ICD-10-CM | POA: Diagnosis not present

## 2016-11-01 DIAGNOSIS — Z634 Disappearance and death of family member: Secondary | ICD-10-CM | POA: Diagnosis not present

## 2016-11-01 DIAGNOSIS — M4722 Other spondylosis with radiculopathy, cervical region: Secondary | ICD-10-CM | POA: Diagnosis present

## 2016-11-01 DIAGNOSIS — F10231 Alcohol dependence with withdrawal delirium: Secondary | ICD-10-CM | POA: Diagnosis present

## 2016-11-01 DIAGNOSIS — F4310 Post-traumatic stress disorder, unspecified: Secondary | ICD-10-CM | POA: Diagnosis present

## 2016-11-01 DIAGNOSIS — Z814 Family history of other substance abuse and dependence: Secondary | ICD-10-CM | POA: Diagnosis not present

## 2016-11-01 DIAGNOSIS — G8929 Other chronic pain: Secondary | ICD-10-CM | POA: Diagnosis present

## 2016-11-01 DIAGNOSIS — F1020 Alcohol dependence, uncomplicated: Secondary | ICD-10-CM | POA: Diagnosis present

## 2016-11-01 DIAGNOSIS — F1910 Other psychoactive substance abuse, uncomplicated: Secondary | ICD-10-CM | POA: Diagnosis not present

## 2016-11-01 DIAGNOSIS — Z62810 Personal history of physical and sexual abuse in childhood: Secondary | ICD-10-CM | POA: Diagnosis not present

## 2016-11-01 LAB — RAPID URINE DRUG SCREEN, HOSP PERFORMED
Amphetamines: NOT DETECTED
Barbiturates: NOT DETECTED
Benzodiazepines: NOT DETECTED
Cocaine: NOT DETECTED
Opiates: NOT DETECTED
Tetrahydrocannabinol: NOT DETECTED

## 2016-11-01 MED ORDER — LORAZEPAM 2 MG/ML IJ SOLN
0.0000 mg | Freq: Four times a day (QID) | INTRAMUSCULAR | Status: DC
  Administered 2016-11-01: 2 mg via INTRAVENOUS
  Filled 2016-11-01: qty 1

## 2016-11-01 MED ORDER — FLUOXETINE HCL 40 MG PO CAPS: 40.0000 mg | ORAL_CAPSULE | Freq: Every day | ORAL | Status: DC

## 2016-11-01 MED ORDER — LORAZEPAM 2 MG/ML IJ SOLN: 0.0000 mg | Freq: Two times a day (BID) | INTRAMUSCULAR | Status: DC

## 2016-11-01 MED ORDER — ALUM & MAG HYDROXIDE-SIMETH 200-200-20 MG/5ML PO SUSP: 30.0000 mL | ORAL | Status: DC | PRN

## 2016-11-01 MED ORDER — ADULT MULTIVITAMIN W/MINERALS CH
1.0000 | ORAL_TABLET | Freq: Every day | ORAL | Status: DC
  Administered 2016-11-01 – 2016-11-03 (×3): 1 via ORAL
  Filled 2016-11-01 (×5): qty 1

## 2016-11-01 MED ORDER — THIAMINE HCL 100 MG/ML IJ SOLN: 100.0000 mg | Freq: Every day | INTRAMUSCULAR | Status: DC

## 2016-11-01 MED ORDER — HYDROXYZINE HCL 25 MG PO TABS
25.0000 mg | ORAL_TABLET | Freq: Four times a day (QID) | ORAL | Status: DC | PRN
  Administered 2016-11-01: 25 mg via ORAL
  Filled 2016-11-01: qty 1

## 2016-11-01 MED ORDER — FLUOXETINE HCL 20 MG PO CAPS
20.0000 mg | ORAL_CAPSULE | Freq: Every day | ORAL | Status: DC
  Administered 2016-11-02 – 2016-11-03 (×2): 20 mg via ORAL
  Filled 2016-11-01 (×4): qty 1

## 2016-11-01 MED ORDER — GABAPENTIN 300 MG PO CAPS: 300.0000 mg | ORAL_CAPSULE | Freq: Every day | ORAL | Status: DC

## 2016-11-01 MED ORDER — GABAPENTIN 300 MG PO CAPS
300.0000 mg | ORAL_CAPSULE | Freq: Every day | ORAL | Status: DC
  Administered 2016-11-01 – 2016-11-03 (×3): 300 mg via ORAL
  Filled 2016-11-01 (×6): qty 1

## 2016-11-01 MED ORDER — LORAZEPAM 1 MG PO TABS
0.0000 mg | ORAL_TABLET | Freq: Four times a day (QID) | ORAL | Status: DC
  Administered 2016-11-01: 1 mg via ORAL
  Filled 2016-11-01: qty 1

## 2016-11-01 MED ORDER — HYDROXYZINE HCL 25 MG PO TABS
25.0000 mg | ORAL_TABLET | Freq: Four times a day (QID) | ORAL | Status: AC | PRN
  Administered 2016-11-02 – 2016-11-03 (×4): 25 mg via ORAL
  Filled 2016-11-01 (×4): qty 1

## 2016-11-01 MED ORDER — MAGNESIUM HYDROXIDE 400 MG/5ML PO SUSP: 30.0000 mL | Freq: Every day | ORAL | Status: DC | PRN

## 2016-11-01 MED ORDER — LOPERAMIDE HCL 2 MG PO CAPS: 2.0000 mg | ORAL_CAPSULE | ORAL | Status: DC | PRN

## 2016-11-01 MED ORDER — LORAZEPAM 1 MG PO TABS: 0.0000 mg | ORAL_TABLET | Freq: Two times a day (BID) | ORAL | Status: DC

## 2016-11-01 MED ORDER — ACETAMINOPHEN 325 MG PO TABS
650.0000 mg | ORAL_TABLET | Freq: Four times a day (QID) | ORAL | Status: DC | PRN
  Administered 2016-11-01 – 2016-11-03 (×2): 650 mg via ORAL
  Filled 2016-11-01 (×4): qty 2

## 2016-11-01 MED ORDER — NICOTINE 21 MG/24HR TD PT24
21.0000 mg | MEDICATED_PATCH | Freq: Every day | TRANSDERMAL | Status: DC
  Administered 2016-11-01 – 2016-11-05 (×5): 21 mg via TRANSDERMAL
  Filled 2016-11-01 (×7): qty 1

## 2016-11-01 MED ORDER — LORAZEPAM 1 MG PO TABS
1.0000 mg | ORAL_TABLET | Freq: Four times a day (QID) | ORAL | Status: DC | PRN
  Administered 2016-11-02 – 2016-11-03 (×3): 1 mg via ORAL
  Filled 2016-11-01 (×3): qty 1

## 2016-11-01 MED ORDER — FLUOXETINE HCL 20 MG PO CAPS
20.0000 mg | ORAL_CAPSULE | Freq: Every day | ORAL | Status: DC
  Administered 2016-11-01: 20 mg via ORAL

## 2016-11-01 MED ORDER — BENZTROPINE MESYLATE 1 MG PO TABS: 1.0000 mg | ORAL_TABLET | Freq: Four times a day (QID) | ORAL | Status: DC | PRN

## 2016-11-01 MED ORDER — THIAMINE HCL 100 MG/ML IJ SOLN: 100.0000 mg | Freq: Once | INTRAMUSCULAR | Status: DC

## 2016-11-01 MED ORDER — ONDANSETRON 4 MG PO TBDP
4.0000 mg | ORAL_TABLET | Freq: Four times a day (QID) | ORAL | Status: DC | PRN
  Administered 2016-11-01: 4 mg via ORAL
  Filled 2016-11-01: qty 1

## 2016-11-01 MED ORDER — VITAMIN B-1 100 MG PO TABS
100.0000 mg | ORAL_TABLET | Freq: Every day | ORAL | Status: DC
  Administered 2016-11-01: 100 mg via ORAL
  Filled 2016-11-01: qty 1

## 2016-11-01 MED ORDER — GABAPENTIN 400 MG PO CAPS: 400.0000 mg | ORAL_CAPSULE | Freq: Every day | ORAL | Status: DC

## 2016-11-01 MED ORDER — VITAMIN B-1 100 MG PO TABS
100.0000 mg | ORAL_TABLET | Freq: Every day | ORAL | Status: DC
  Administered 2016-11-02 – 2016-11-03 (×2): 100 mg via ORAL
  Filled 2016-11-01 (×4): qty 1

## 2016-11-01 MED ORDER — HALOPERIDOL 5 MG PO TABS: 5.0000 mg | ORAL_TABLET | Freq: Four times a day (QID) | ORAL | Status: DC | PRN

## 2016-11-01 MED ORDER — SODIUM CHLORIDE 0.9 % IV BOLUS (SEPSIS)
1000.0000 mL | Freq: Once | INTRAVENOUS | Status: AC
  Administered 2016-11-01: 1000 mL via INTRAVENOUS

## 2016-11-01 MED ORDER — TRAZODONE HCL 50 MG PO TABS
50.0000 mg | ORAL_TABLET | Freq: Every evening | ORAL | Status: DC | PRN
  Administered 2016-11-01: 50 mg via ORAL
  Filled 2016-11-01: qty 1

## 2016-11-01 MED ORDER — FLUOXETINE HCL 40 MG PO CAPS: 20.0000 mg | ORAL_CAPSULE | Freq: Every day | ORAL | Status: DC

## 2016-11-01 NOTE — BHH Group Notes (Signed)
Adult Psychoeducational Group Note  Date:  11/01/2016 Time:  8:52 PM  Group Topic/Focus:  Wrap-Up Group:   The focus of this group is to help patients review their daily goal of treatment and discuss progress on daily workbooks.  Participation Level:  Active  Participation Quality:  Appropriate  Affect:  Appropriate  Cognitive:  Appropriate  Insight: Appropriate  Engagement in Group:  Engaged  Modes of Intervention:  Discussion  Additional Comments:  Pt. Stated that she did not enjoy her first day here. Pt. Wants to have a better day tomorrow.  Wallace Going, Dominque Marlin E 11/01/2016, 8:52 PM

## 2016-11-01 NOTE — Progress Notes (Signed)
Patient ID: Laura [name], female   DOB: [birth year], 46 y.o.   MRN: [id number] PER STATE REGULATIONS 482.30  THIS CHART WAS REVIEWED FOR MEDICAL NECESSITY WITH RESPECT TO THE PATIENT'S ADMISSION/DURATION OF STAY.  NEXT REVIEW DATE: 11/05/16 Roma Schanz, RN, BSN CASE MANAGER

## 2016-11-01 NOTE — Progress Notes (Signed)
Patient denied SI and HI while talking to nurse, contracts for safety.  Denied A/V hallucinations.  Patient upset while talking to nurse about her 44 yr old son who died 2 months ago.  Stated she has been drinking a lot and needs help to cope with her son's death.  Respirations even and unlabored.   Safety maintained with 15 minute checks.

## 2016-11-01 NOTE — Progress Notes (Signed)
Patient stated she is not really feeling any better.  Patient sitting in dayroom, watching tv with peers.  Respirations even and unlabored.  Safety maintained with 15 minute checks.

## 2016-11-01 NOTE — Progress Notes (Signed)
Patient ID: Laura [name], female   DOB: 1970-05-28, 46 y.o.   MRN: [id number]  Pt currently presents with a flat affect and depressed behavior. Pt seen in dayroom watching television for most of the night, limited interaction with peers. Pt reports to writer that their goal is to "get my medications right." Pt reports ongoing back pain that as needed medication is helping "a little." Pt reports poor sleep prior to arrival. Endorses being prescribed Seroquel 400mg  but patient reports she was nonadherent. Asks to take Trazodone tonight. Presents reports that prior to medication administration she was experiencing signs and symptoms of withdrawal including headache, fatigue, tremors and nausea/vomiting. Pt now reports a mild headache.  Pt provided with medications per providers orders. Pt's labs, CIWA score and vitals were monitored throughout the night. Pt given a 1:1 about emotional and mental status. Pt supported and encouraged to express concerns and questions. Pt educated on medications and withdrawal symptoms.   Pt's safety ensured with 15 minute and environmental checks. Pt currently denies SI/HI and A/V hallucinations. Pt verbally agrees to seek staff if SI/HI or A/VH occurs and to consult with staff before acting on any harmful thoughts. Will continue POC.
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
## 2016-11-01 NOTE — ED Notes (Signed)
Per [facility], Pt has a bed (307-1) and the Pt can come over between 1430-1500.

## 2016-11-01 NOTE — ED Provider Notes (Addendum)
Pt sober. No HI or SI. Contracts for safety. Son here to take patient home and will care for his mother. Agreeable to outpatient management. Referrals given  10:25 AM Change in management. Was seen by psychiatry team who will manage as inpatient. Please see consultation note for complete details   Jola Schmidt, MD 11/01/16 1000    Jola Schmidt, MD 11/01/16 1026
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
## 2016-11-01 NOTE — BH Assessment (Signed)
[place] Assessment Progress Note   Clinician attempted to see patient but she is too sleepy to be assessed at this time.  Nurse informed clinician that patient had been given 2mg  of Ativan at 01:15 also.  Clinician to attempt again later.

## 2016-11-01 NOTE — BH Assessment (Signed)
[place] Assessment Progress Note   Clinician attempted to assess patient again.  She did say hello but went right back to sleep and did not respond to other inquiries.  Pt currently too sleepy to assess still.  TTS to assess patient when she is alert and oriented.
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
## 2016-11-01 NOTE — BH Assessment (Addendum)
[facility] Assessment Progress Note  Per Corena Pilgrim, MD, this pt requires psychiatric hospitalization.  Leonia Reader, RN, [facility] has assigned pt to [facility] Rm 307-1; they will be ready to receive pt at 15:00.  Dr Darleene Cleaver also finds that pt meets criteria for IVC, which he has initiated.  IVC documents have been faxed to [facility], and at 11:37 Rica Koyanagi confirms receipt.  As of this writing, service of Findings and Custody Order is pending.  Pt's nurse has been notified, and agrees to call report to [phone].  Pt is to be transported via Event organ[facility] after documents are served.   Jalene Mullet, MA Triage Specialist [phone]   Addendum:  Findings and Custody Order has been served, and has been faxed to [facility].  Jalene Mullet, [place] Triage Specialist [phone]

## 2016-11-02 DIAGNOSIS — G4700 Insomnia, unspecified: Secondary | ICD-10-CM

## 2016-11-02 DIAGNOSIS — Z62811 Personal history of psychological abuse in childhood: Secondary | ICD-10-CM

## 2016-11-02 DIAGNOSIS — Z634 Disappearance and death of family member: Secondary | ICD-10-CM

## 2016-11-02 DIAGNOSIS — Z560 Unemployment, unspecified: Secondary | ICD-10-CM

## 2016-11-02 DIAGNOSIS — Z62810 Personal history of physical and sexual abuse in childhood: Secondary | ICD-10-CM

## 2016-11-02 DIAGNOSIS — Z599 Problem related to housing and economic circumstances, unspecified: Secondary | ICD-10-CM

## 2016-11-02 DIAGNOSIS — F332 Major depressive disorder, recurrent severe without psychotic features: Principal | ICD-10-CM

## 2016-11-02 MED ORDER — NALTREXONE HCL 50 MG PO TABS
50.0000 mg | ORAL_TABLET | Freq: Every day | ORAL | Status: DC
  Administered 2016-11-02 – 2016-11-05 (×4): 50 mg via ORAL
  Filled 2016-11-02 (×6): qty 1

## 2016-11-02 MED ORDER — QUETIAPINE FUMARATE 50 MG PO TABS
50.0000 mg | ORAL_TABLET | Freq: Every day | ORAL | Status: DC
  Administered 2016-11-02: 50 mg via ORAL
  Filled 2016-11-02 (×3): qty 1
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
## 2016-11-02 NOTE — Progress Notes (Signed)
Nursing Progress Note: 7p-7a D: Pt currently presents with a anxious/sad affect and behavior. Pt states "I spoke in [place] about my son, and I did not even cry. It was surprising but I think I am coming into acceptance." Interacting appropriately with the milieu. Pt reports poor sleep during the previous night with current medication regimen. Pt did attend wrap-up group.  A: Pt provided with medications per providers orders. Pt's labs and vitals were monitored throughout the night. Pt supported emotionally and encouraged to express concerns and questions. Pt educated on medications.  R: Pt's safety ensured with 15 minute and environmental checks. Pt currently denies SI, HI, and AVH. Pt verbally contracts to seek staff if SI,HI, or AVH occurs and to consult with staff before acting on any harmful thoughts. Will continue to monitor.

## 2016-11-02 NOTE — BHH Suicide Risk Assessment (Signed)
[place] INPATIENT:  Family/Significant Other Suicide Prevention Education  Suicide Prevention Education:  Patient Refusal for Family/Significant Other Suicide Prevention Education: The patient Laura [name] has refused to provide written consent for family/significant other to be provided Family/Significant Other Suicide Prevention Education during admission and/or prior to discharge.  Physician notified.  Georga Kaufmann, MSW, LCSWA  11/02/2016, 3:29 PM

## 2016-11-02 NOTE — Progress Notes (Signed)
DAR Pt present with depressed mood and flat affect. Pt complained of shakingness, anxiety and not feeling well. Pt is guarded and not sharing much. Pt stated she did not sleep well last night, fair appetite, low energy and good concentration. Pt rates depression at 5, hopelessness at 2 and anxiety at 10. States her goal for today is to "sleep." Pt's safety ensured with 15 minute and environmental checks. Pt currently denies SI/HI and A/V hallucinations. Pt verbally agrees to seek staff if SI/HI or A/VH occurs and to consult with staff before acting on any harmful thoughts. Will continue POC.
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
## 2016-11-02 NOTE — Social Work (Signed)
Referred to [facility], is [place] resident.  Esti Demello, LCSW Lead Clinical Social Worker Phone:  [phone]  

## 2016-11-03 DIAGNOSIS — F10239 Alcohol dependence with withdrawal, unspecified: Secondary | ICD-10-CM

## 2016-11-03 DIAGNOSIS — F1910 Other psychoactive substance abuse, uncomplicated: Secondary | ICD-10-CM

## 2016-11-03 DIAGNOSIS — F17210 Nicotine dependence, cigarettes, uncomplicated: Secondary | ICD-10-CM

## 2016-11-03 MED ORDER — LORAZEPAM 1 MG PO TABS
1.0000 mg | ORAL_TABLET | Freq: Four times a day (QID) | ORAL | Status: AC
  Administered 2016-11-03 (×3): 1 mg via ORAL
  Filled 2016-11-03 (×2): qty 1

## 2016-11-03 MED ORDER — FLUOXETINE HCL 20 MG PO CAPS
40.0000 mg | ORAL_CAPSULE | Freq: Every day | ORAL | Status: DC
  Administered 2016-11-04 – 2016-11-05 (×2): 40 mg via ORAL
  Filled 2016-11-03 (×4): qty 2

## 2016-11-03 MED ORDER — LORAZEPAM 1 MG PO TABS
1.0000 mg | ORAL_TABLET | Freq: Two times a day (BID) | ORAL | Status: DC
  Administered 2016-11-05: 1 mg via ORAL
  Filled 2016-11-03 (×2): qty 1

## 2016-11-03 MED ORDER — LORAZEPAM 1 MG PO TABS: 1.0000 mg | ORAL_TABLET | Freq: Every day | ORAL | Status: DC

## 2016-11-03 MED ORDER — VITAMIN B-1 100 MG PO TABS
100.0000 mg | ORAL_TABLET | Freq: Every day | ORAL | Status: DC
  Administered 2016-11-04 – 2016-11-05 (×2): 100 mg via ORAL
  Filled 2016-11-03 (×4): qty 1

## 2016-11-03 MED ORDER — ADULT MULTIVITAMIN W/MINERALS CH
1.0000 | ORAL_TABLET | Freq: Every day | ORAL | Status: DC
  Administered 2016-11-03 – 2016-11-05 (×3): 1 via ORAL
  Filled 2016-11-03 (×5): qty 1

## 2016-11-03 MED ORDER — LORAZEPAM 1 MG PO TABS
1.0000 mg | ORAL_TABLET | Freq: Four times a day (QID) | ORAL | Status: DC | PRN
  Filled 2016-11-03: qty 1

## 2016-11-03 MED ORDER — LORAZEPAM 1 MG PO TABS
1.0000 mg | ORAL_TABLET | Freq: Three times a day (TID) | ORAL | Status: AC
  Administered 2016-11-04 (×3): 1 mg via ORAL
  Filled 2016-11-03 (×3): qty 1

## 2016-11-03 MED ORDER — QUETIAPINE FUMARATE 100 MG PO TABS
100.0000 mg | ORAL_TABLET | Freq: Every day | ORAL | Status: DC
  Administered 2016-11-03 – 2016-11-04 (×2): 100 mg via ORAL
  Filled 2016-11-03 (×4): qty 1

## 2016-11-03 NOTE — BHH Counselor (Signed)
[facility] LCSW Group Therapy Note  11/03/16  At 10:20 to 11:15 AM  Type of Therapy and Topic:  Group Therapy: Avoiding Self-Sabotaging and Enabling Behaviors  Participation Level:  Minimal   Description of Group The main focus of today's process group to discuss what "self-sabotage" means and use motivational iInterviewing to discuss what benefits, negative or positive, were involved in a self-identified self-sabotaging behavior. We then talked about reasons the patient may want to change the behavior and their current desire to change.   Summary of Patient Progress: Patient was somewhat attentive as evidenced by her eye contact yet presented with flat unengaged affect. Patient shared difficult time since move her which is now made harder with death of 37 YO son. Patient brightened significantly once group ended and she returned to card game with peers.    Therapeutic molalities: Cognitive Behavioral Therapy Person-Centered Therapy Motivational Interviewing  Therapeutic Goals: 1. Patients will demonstrate understanding of the concept of self sabotage 2. Patients will be able to identify pros and cons of their behaviors 3. Patients will be able to identify at least two motivating factors for l of their desire for change   Sheilah Pigeon, LCSW
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
## 2016-11-03 NOTE — Progress Notes (Signed)
D:  Laura [name] has been up and visible on the unit.  She denies any suicidal ideation at this time and will contract for safety on the unit.  She continues to voice withdrawal symptoms such as anxiety and tremors.  She did complete her self inventory and reported her depression 0/10, anxiety 5/10 and hopelessness 0/10. She denies HI or A/V hallucinations.  Her son came to get money out of her locker.  She was accompanied by staff to search room, locker opened by security and she removed $20.00 from the $40 that was in her locker.  She signed the proper paperwork.    A:  1:1 interaction for support and encouragement.  Q 15 minute checks maintained for safety.  Medications as ordered.  R:  Laura [name] remains safe on the unit.  Encouraged participation in group and unit activities.  We will continue to monitor the progress towards her goals.

## 2016-11-03 NOTE — Progress Notes (Signed)
Pt awake and unable to fall asleep at 0300. Pt states Seroquel dosage too low.

## 2016-11-04 LAB — COMPREHENSIVE METABOLIC PANEL
ALT: 42 U/L (ref 14–54)
AST: 41 U/L (ref 15–41)
Albumin: 3.7 g/dL (ref 3.5–5.0)
Alkaline Phosphatase: 67 U/L (ref 38–126)
Anion gap: 5 (ref 5–15)
BUN: 9 mg/dL (ref 6–20)
CHLORIDE: 108 mmol/L (ref 101–111)
CO2: 28 mmol/L (ref 22–32)
Calcium: 9.2 mg/dL (ref 8.9–10.3)
Creatinine, Ser: 0.72 mg/dL (ref 0.44–1.00)
Glucose, Bld: 94 mg/dL (ref 65–99)
POTASSIUM: 4 mmol/L (ref 3.5–5.1)
Sodium: 141 mmol/L (ref 135–145)
TOTAL PROTEIN: 7.3 g/dL (ref 6.5–8.1)
Total Bilirubin: 0.6 mg/dL (ref 0.3–1.2)

## 2016-11-04 MED ORDER — BENZOCAINE 10 % MT GEL
Freq: Four times a day (QID) | OROMUCOSAL | Status: DC | PRN
  Administered 2016-11-04: 19:00:00 via OROMUCOSAL
  Filled 2016-11-04: qty 9.4

## 2016-11-04 MED ORDER — GABAPENTIN 300 MG PO CAPS
300.0000 mg | ORAL_CAPSULE | Freq: Three times a day (TID) | ORAL | Status: DC
  Administered 2016-11-04 – 2016-11-05 (×3): 300 mg via ORAL
  Filled 2016-11-04 (×9): qty 1

## 2016-11-04 MED ORDER — NAPROXEN 500 MG PO TABS
500.0000 mg | ORAL_TABLET | Freq: Two times a day (BID) | ORAL | Status: DC | PRN
  Administered 2016-11-04: 500 mg via ORAL
  Filled 2016-11-04: qty 1

## 2016-11-04 NOTE — Progress Notes (Signed)
Patient did attend the evening speaker AA meeting.  

## 2016-11-04 NOTE — Progress Notes (Signed)
[facility] MD Progress Note  11/04/2016 11:56 AM Laura [name]  MRN:  [id number] Subjective:   46 yo Caucasian female, single, lives with her foster father and her son, unemployed, on SSI. Background history of SUD and mood disorder. Recently lost her son via drowning. Presented to the ER via EMS. Was intoxicated with BAL 324 mg/dl.  Expressed suicidal thoughts while intoxicated. Dismissive of suicidal thoughts when she sobered up. Major stressor as unexpected death of her son last 02-Jun-2022. She attempted suicide twice since then. Cut self once and overdosed the other time. No prior suicidal behavior. She has been expressing a lot of guilt, hopelessness and worthlessness. She has an upcoming court date for open container.  Chart reviewed today. Patient discussed at team today.   Staff reports that she is still coming off alcohol. CIWA scores are still significant. She has some underlying irritability. She has participated at some of the unit groups and activities. Sleep wake cycle is still erratic as she naps during the day.  Seen today. Reports mild shakes. No nausea, not vomiting. No hallucination in any modality. No feeling of persecution or impending doom. Tolerating recent medication adjustment well. Says she is in better spirits. No suicidal thoughts. Says she has a 63 yo son to raise. She recently moved from [place]. Has not affiliated self with local AA. Optimistic that when she gets better she would start attending meetings. Says she enjoyed the ones she attended here. Patient reports chronic back pain. We agreed to target pain with Gabapentin and NSAIDS. She would follow up with pain management on outpatient basis.    Principal Problem: MDD (major depressive disorder), recurrent severe, without psychosis ([place]) Diagnosis:   Patient Active Problem List   Diagnosis Date Noted  . PTSD (post-traumatic stress disorder) [F43.10] 08/12/2016  . Major depressive disorder, recurrent severe without psychotic  features ([place]) [F33.2] 08/11/2016  . Alcohol intoxication in active alcoholic without complication ([place]) [N47.096]   . Suicide attempt ([place]) [T14.91XA]   . Alcohol use disorder, severe, dependence ([place]) [F10.20] 07/23/2016  . 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 Hypokalemia [E87.6] 07/23/2016  . Prolonged Q-T interval on ECG [R94.31] 07/23/2016  . MDD (major depressive disorder), recurrent severe, without psychosis ([place]) [F33.2] 07/21/2016  . Osteoarthritis of spine with radiculopathy, cervical region [facility] 07/02/2016  . Substance abuse [F19.10] 07/02/2016  . Lumbar spondylosis [M47.816] 07/02/2016   Total Time spent with patient: 20 minutes  Past Psychiatric History: As in H&P  Past Medical History:  Past Medical History:  Diagnosis Date  . Anxiety   . Substance abuse     Past Surgical History:  Procedure Laterality Date  . BACK SURGERY    . KNEE SURGERY     Family History:  Family History  Problem Relation Age of Onset  . Family history unknown: Yes   Family Psychiatric  History: As in H&P Social History:  History  Alcohol Use  . Yes    Comment: 3-4 pints a day     History  Drug Use No    Comment: denies     Social History   Social History  . Marital status: Single    Spouse name: N/A  . Number of children: N/A  . Years of education: N/A   Social History Main Topics  . Smoking status: Current Every Day Smoker    Packs/day: 1.00  . Smokeless tobacco: Never Used  . Alcohol use Yes  Comment: 3-4 pints a day  . Drug use: No     Comment: denies   . Sexual activity: No   Other Topics Concern  . None   Social History Narrative  . None   Additional Social History:    Pain Medications: please see Mar Prescriptions: please see Mar Over the Counter: please see Mar History of alcohol / drug use?: Yes Longest period of sobriety (when/how long): 3 years Negative  Consequences of Use: Financial Withdrawal Symptoms: Agitation, Irritability, Nausea / Vomiting, Blackouts, Seizures Onset of Seizures: 4 years ago Date of most recent seizure: 4 years ago Name of Substance 1: Alcohol  1 - Age of First Use: 46 yrs old  1 - Amount (size/oz): 1-2 pints of liqour 1 - Frequency: 3x's per week  1 - Duration: on-going 1 - Last Use / Amount: "I drank 1 pint of liqour last night" Name of Substance 2: Heroin  2 - Age of First Use: 17 2 - Amount (size/oz): 10 bags 2 - Frequency: Daily 2 - Duration: Ongoing 2 - Last Use / Amount: Patient denies any use this date. Patient states she has been maintaining her sobriety since April.       Sleep: Good  Appetite:  Good  Current Medications: Current Facility-Administered Medications  Medication Dose Route Frequency Provider Last Rate Last Dose  . acetaminophen (TYLENOL) tablet 650 mg  650 mg Oral Q6H PRN Ethelene Hal, NP   650 mg at 11/03/16 1731  . alum & mag hydroxide-simeth (MAALOX/MYLANTA) 200-200-20 MG/5ML suspension 30 mL  30 mL Oral Q4H PRN Ethelene Hal, NP      . FLUoxetine (PROZAC) capsule 40 mg  40 mg Oral Daily Norman Clay, MD   40 mg at 11/04/16 0801  . gabapentin (NEURONTIN) capsule 300 mg  300 mg Oral QHS Ethelene Hal, NP   300 mg at 11/03/16 2110  . hydrOXYzine (ATARAX/VISTARIL) tablet 25 mg  25 mg Oral Q6H PRN Ethelene Hal, NP   25 mg at 11/03/16 2110  . LORazepam (ATIVAN) tablet 1 mg  1 mg Oral Q6H PRN Norman Clay, MD      . LORazepam (ATIVAN) tablet 1 mg  1 mg Oral TID Norman Clay, MD   1 mg at 11/04/16 0804   Followed by  . [START ON 11/05/2016] LORazepam (ATIVAN) tablet 1 mg  1 mg Oral BID Norman Clay, MD       Followed by  . [START ON 11/06/2016] LORazepam (ATIVAN) tablet 1 mg  1 mg Oral Daily Hisada, Reina, MD      . magnesium hydroxide (MILK OF MAGNESIA) suspension 30 mL  30 mL Oral Daily PRN Ethelene Hal, NP      . multivitamin with minerals  tablet 1 tablet  1 tablet Oral Daily Hisada, Reina, MD   1 tablet at 11/04/16 0801  . naltrexone (DEPADE) tablet 50 mg  50 mg Oral Daily Izediuno, Laruth Bouchard, MD   50 mg at 11/04/16 0801  . nicotine (NICODERM CQ - dosed in mg/24 hours) patch 21 mg  21 mg Transdermal Daily Cobos, Myer Peer, MD   21 mg at 11/04/16 0802  . QUEtiapine (SEROQUEL) tablet 100 mg  100 mg Oral QHS Norman Clay, MD   100 mg at 11/03/16 2110  . thiamine (VITAMIN B-1) tablet 100 mg  100 mg Oral Daily Norman Clay, MD   100 mg at 11/04/16 0802    Lab Results:  Results for orders placed or  performed during the hospital encounter of 11/01/16 (from the past 48 hour(s))  Comprehensive metabolic panel     Status: None   Collection Time: 11/04/16  6:12 AM  Result Value Ref Range   Sodium 141 135 - 145 mmol/L   Potassium 4.0 3.5 - 5.1 mmol/L   [place] 108 101 - 111 mmol/L   CO2 28 22 - 32 mmol/L   Glucose, Bld 94 65 - 99 mg/dL   BUN 9 6 - 20 mg/dL   Creatinine, Ser 0.72 0.44 - 1.00 mg/dL   Calcium 9.2 8.9 - 10.3 mg/dL   Total Protein 7.3 6.5 - 8.1 g/dL   Albumin 3.7 3.5 - 5.0 g/dL   AST 41 15 - 41 U/L   ALT 42 14 - 54 U/L   Alkaline Phosphatase 67 38 - 126 U/L   Total Bilirubin 0.6 0.3 - 1.2 mg/dL   GFR calc non Af Amer >60 >60 mL/min   GFR calc Af Amer >60 >60 mL/min    Comment: (NOTE) The eGFR has been calculated using the CKD EPI equation. This calculation has not been validated in all clinical situations. eGFR's persistently <60 mL/min signify possible Chronic Kidney Disease.    Anion gap 5 5 - 15    Comment: Performed at [facility], [place] [address] [place], [place] [address]    Blood Alcohol level:  Lab Results  Component Value Date   ETH 324 [facility]) 10/31/2016   ETH 329 (HH) 88/28/0034    Metabolic Disorder Labs: Lab Results  Component Value Date   HGBA1C 4.9 07/23/2016   MPG 94 07/23/2016   No results found for: PROLACTIN Lab Results  Component Value Date   CHOL 239 (H)  07/23/2016   TRIG 95 07/23/2016   HDL 87 07/23/2016   CHOLHDL 2.7 07/23/2016   VLDL 19 07/23/2016   LDLCALC 133 (H) 07/23/2016    Physical Findings: AIMS: Facial and Oral Movements Muscles of Facial Expression: None, normal Lips and Perioral Area: None, normal Jaw: None, normal Tongue: None, normal,Extremity Movements Upper (arms, wrists, hands, fingers): None, normal Lower (legs, knees, ankles, toes): None, normal, Trunk Movements Neck, shoulders, hips: None, normal, Overall Severity Severity of abnormal movements (highest score from questions above): None, normal Incapacitation due to abnormal movements: None, normal Patient's awareness of abnormal movements (rate only patient's report): No Awareness, Dental Status Current problems with teeth and/or dentures?: No Does patient usually wear dentures?: No  CIWA:  CIWA-Ar Total: 10 COWS:  COWS Total Score: 4  Musculoskeletal: Strength & Muscle Tone: within normal limits Gait & Station: unsteady Patient leans: N/A  Psychiatric Specialty Exam: Physical Exam  Constitutional: No distress.  HENT:  Head: Normocephalic.  Respiratory: Effort normal.  Neurological: She is alert.  Skin: She is not diaphoretic.    ROS  Blood pressure 108/65, pulse 73, temperature 97.8 F (36.6 C), temperature source Oral, resp. rate 18, height _0  (1.727 m), weight 72.6 kg (160 lb).Body mass index is 24.33 kg/m.  General Appearance: Neatly dressed, was playing cards with peers just before interview, mild tremors. Pleasant and engaged well. Appropriate behavior.   Eye Contact:  Good  Speech:  Clear and Coherent and Normal Rate  Volume:  Normal  Mood:  Better   Affect:  Restricted mostly   Thought Process:  Linear  Orientation:  Full (Time, Place, and Person)  Thought Content:  Future oriented. No violent thoughts. No hallucination in any modality.   Suicidal Thoughts:  Denies any suicidal  thoughts.   Homicidal Thoughts:  No  Memory:   Immediate;   Good Recent;   Good Remote;   Good  Judgement:  Good  Insight:  Good  Psychomotor Activity:  Normal  Concentration:  Concentration: Good and Attention Span: Good  Recall:  Good  Fund of Knowledge:  Good  Language:  Good  Akathisia:  Negative  Handed:    AIMS (if indicated):     Assets:  Communication Skills Desire for Improvement Housing Resilience Social Support  ADL's:  Intact  Cognition:  WNL  Sleep:  Number of Hours: 5.75    Assessment and Plan  Patient is still coming off alcohol. We are still tapering off benzodiazepines. She is tolerating recent medication adjustments well. Her mood is stabilizing. She is not a danger to herself or others.  Hopeful discharge early this week.   Psychiatric: AUD MDD Grief   Medical: Chronic pain  Psychosocial:  Recently relocated  Limited insurance benefits    PLAN: 1. Increase Gabapentin to 300 mg TID 2. Encourage unit groups and activities 3. Monitor mood, behavior and interaction with peers 4. Motivational enhancement  5. SW would gather collateral and facilitate aftercare.    [place], MD 11/04/2016, 11:56 AM

## 2016-11-04 NOTE — Progress Notes (Addendum)
Nursing Progress Note: 7p-7a D: Pt currently presents with a depressed/anxious/improved affect and behavior. Pt states "I got ativan today and it was extremely helpful." Interacting appropriately with the milieu. Pt reports poor sleep during the previous night with current medication regimen. Pt did attend wrap-up group.  A: Pt provided with medications per providers orders. Pt's labs and vitals were monitored throughout the night. Pt supported emotionally and encouraged to express concerns and questions. Pt educated on medications.  R: Pt's safety ensured with 15 minute and environmental checks. Pt currently denies SI, HI, and AVH. Pt verbally contracts to seek staff if SI,HI, or AVH occurs and to consult with staff before acting on any harmful thoughts. Will continue to monitor.

## 2016-11-04 NOTE — Progress Notes (Signed)
Data. Patient denies SI/HI/AVH. Verbally contracts for safety on the unit and to come to staff before acting of any self harm thoughts/feelings.  Patient is irritable. Spending most of the shift in her room, in bed. Not interacting in the milieu, in the morning. Affect is irritable and flat. Does not brighten with interaction. Patient reports, "I have shooting pains going down my back. I want better pain meds, or you can send me to the hospital to get an MRI and find out if something new is happening." MD notified of patiet's request. New medication orders received. Patient's affect is brighter in the afternoon and patient is in the dayroom, playing cards with peers. Action. Emotional support and encouragement offered. Education provided on medication, indications and side effect. Q 15 minute checks done for safety. Response. Safety on the unit maintained through 15 minute checks.  Medications taken as prescribed. Attended groups. Remained calm and appropriate through out shift.

## 2016-11-04 NOTE — BHH Group Notes (Signed)
[place] LCSW Group Therapy Note     11/04/2016 10:15 to 11:15 AM  Type of Therapy and Topic: Group Therapy: Feelings Around Returning Home & Establishing a Supportive Framework and Supporting Oneself When Supports Not Available  Participation Level: Active    Description of Group:  Patients first processed thoughts and feelings about up coming discharge. These included fears of upcoming changes, lack of change, new living environments, judgements and expectations from others and overall stigma of MH issues. We then discussed what is a supportive framework? What does it look like feel like and how do I discern it from and unhealthy non-supportive network? Learn how to cope when supports are not helpful and don't support you. Discuss what to do when your family/friends are not supportive.   Therapeutic Goals Addressed in Processing Group:  1. Patient will identify one healthy supportive network that they can use at discharge. 2. Patient will identify one factor of a supportive framework and how to tell it from an unhealthy network. 3. Patient able to identify one coping skill to use when they do not have positive supports from others. 4. Patient will demonstrate ability to communicate their needs through discussion and/or role plays.  Summary of Patient Progress:  Pt engaged mimimally during group session answering only direct questions. As patients processed their anxiety about discharge and described healthy supports patient remained quiet. As peers identified at least one self care tool they were willing to use after discharge Laura [name] appeared attentive.   Laura Pigeon, LCSW

## 2016-11-04 NOTE — Plan of Care (Signed)
Problem: Activity: Goal: Interest or engagement in activities will improve Outcome: Not Progressing Patient is not interacting in the milieu this shift. Goal: Sleeping patterns will improve Outcome: Not Progressing Continues to nap during the day and reports poor sleep at night.  Problem: Education: Goal: Emotional status will improve Outcome: Not Progressing Very irritable with flat/depressed affect.  Problem: Coping: Goal: Ability to verbalize frustrations and anger appropriately will improve Outcome: Progressing Patient is appropriate in her reports of issues and situations that are upsetting her. Goal: Ability to demonstrate self-control will improve Outcome: Progressing Patient is irritable, but her behavior has remained appropriate.  Problem: Safety: Goal: Periods of time without injury will increase Outcome: Progressing No injury this shift.

## 2016-11-05 MED ORDER — QUETIAPINE FUMARATE 100 MG PO TABS: 100.0000 mg | ORAL_TABLET | Freq: Every day | ORAL | 0 refills | Status: DC

## 2016-11-05 MED ORDER — GABAPENTIN 300 MG PO CAPS: 300.0000 mg | ORAL_CAPSULE | Freq: Three times a day (TID) | ORAL | 0 refills | Status: DC

## 2016-11-05 MED ORDER — NALTREXONE HCL 50 MG PO TABS: 50.0000 mg | ORAL_TABLET | Freq: Every day | ORAL | 0 refills | Status: DC

## 2016-11-05 MED ORDER — NICOTINE 21 MG/24HR TD PT24: 21.0000 mg | MEDICATED_PATCH | Freq: Every day | TRANSDERMAL | 0 refills | Status: DC

## 2016-11-05 MED ORDER — FLUOXETINE HCL 40 MG PO CAPS: 40.0000 mg | ORAL_CAPSULE | Freq: Every day | ORAL | 0 refills | Status: DC

## 2016-11-05 MED ORDER — BENZOCAINE 10 % MT GEL: Freq: Four times a day (QID) | OROMUCOSAL | 0 refills | Status: DC | PRN

## 2016-11-05 NOTE — BHH Suicide Risk Assessment (Signed)
Laura [name] INPATIENT:  Family/Significant Other Suicide Prevention Education  Suicide Prevention Education:  Education Completed; Laura [name] (son [phone]), has been identified by the patient as the family member/significant other with whom the patient will be residing, and identified as the person(s) who will aid the patient in the event of a mental health crisis (suicidal ideations/suicide attempt).  With written consent from the patient, the family member/significant other has been provided the following suicide prevention education, prior to the and/or following the discharge of the patient.  The suicide prevention education provided includes the following:  Suicide risk factors  Suicide prevention and interventions  National Suicide Hotline telephone number  [facility] assessment telephone number  [facility] Emergency Assistance [place] and/or Residential Mobile Crisis Unit telephone number  Request made of family/significant other to:  Remove weapons (e.g., guns, rifles, knives), all items previously/currently identified as safety concern.    Remove drugs/medications (over-the-counter, prescriptions, illicit drugs), all items previously/currently identified as a safety concern.  The family member/significant other verbalizes understanding of the suicide prevention education information provided.  The family member/significant other agrees to remove the items of safety concern listed above.  Pt's son states that pt seems to be doing better compared to when she first came to the hospital. Pt's son states that he does not have any concerns about pt being discharged today.  Laura [name], MSW, LCSWA 11/05/2016, 11:31 AM

## 2016-11-05 NOTE — Progress Notes (Signed)
Recreation Therapy Notes  Date: 11/05/16 Time: 0930 Location: 300 Hall Dayroom  Group Topic: Stress Management  Goal Area(s) Addresses:  Patient will verbalize importance of using healthy stress management.  Patient will identify positive emotions associated with healthy stress management.   Behavioral Response: Engaged  Intervention: Stress Management  Activity :  Peaceful Waves.  LRT introduced the stress management technique of guided imagery.  LRT played the sounds of waves at the beach from [place] and read a script to allow patients to visualize being at the beach at sunrise.  Patients were to follow along as LRT read script to engage in the activity.    Education:  Stress Management, Discharge Planning.   Education Outcome: Acknowledges edcuation/In group clarification offered/Needs additional education  Clinical Observations/Feedback: Pt attended group.   Victorino Sparrow, LRT/CTRS         Ria Comment, Chozen Latulippe A 11/05/2016 11:09 AM

## 2016-11-05 NOTE — Progress Notes (Signed)
  [facility] Adult Case Management Discharge Plan :  Will you be returning to the same living situation after discharge:  Yes,  pt returning home. At discharge, do you have transportation home?: Yes,  pt has access to transportation. Do you have the ability to pay for your medications: Yes,  pt has insurance.  Release of information consent forms completed and in the chart;  Patient's signature needed at discharge.  Patient to Follow up at: Follow-up Information    [facility] Follow up on 11/07/2016.   Specialty:  Behavioral Health Why:  Assessment appointment 8/22 at [facility] for Substance Abuse Intensive Outpatient Program. Contact information: [place] [phone]       [facility] Follow up.   Specialty:  Behavioral Health Why:  Patient has accepted Transitional Care Team Services, case management will begin on day of discharge.   Contact information: [place] [address] [phone]           Next level of care provider has access to [place] and Suicide Prevention discussed: Yes,  with pt.  Have you used any form of tobacco in the last 30 days? (Cigarettes, Smokeless Tobacco, Cigars, and/or Pipes): Yes  Has patient been referred to the Quitline?: Patient refused referral  Patient has been referred for addiction treatment: Yes  Georga Kaufmann, MSW, LCSWA 11/05/2016, 10:04 AM

## 2016-11-05 NOTE — Progress Notes (Signed)
Patient ID: Laura [name], female   DOB: [birth year], 46 y.o.   MRN: [id number] PER STATE REGULATIONS 482.30  THIS CHART WAS REVIEWED FOR MEDICAL NECESSITY WITH RESPECT TO THE PATIENT'S ADMISSION/ DURATION OF STAY.  NEXT REVIEW DATE: 11/09/2016  Chauncy Lean, RN, BSN CASE MANAGER

## 2016-11-05 NOTE — Tx Team (Signed)
Interdisciplinary Treatment and Diagnostic Plan Update 11/05/2016 Time of Session: 9:30am  Laura [name]  MRN: [id number]  Principal Diagnosis: MDD (major depressive disorder), recurrent severe, without psychosis ([place])  Secondary Diagnoses: Principal Problem:   MDD (major depressive disorder), recurrent severe, without psychosis ([place]) Active Problems:   Alcohol use disorder, severe, dependence ([place])   Current Medications:  Current Facility-Administered Medications  Medication Dose Route Frequency Provider Last Rate Last Dose  . acetaminophen (TYLENOL) tablet 650 mg  650 mg Oral Q6H PRN Ethelene Hal, NP   650 mg at 11/03/16 1731  . alum & mag hydroxide-simeth (MAALOX/MYLANTA) 200-200-20 MG/5ML suspension 30 mL  30 mL Oral Q4H PRN Ethelene Hal, NP      . benzocaine (ORAJEL) 10 % mucosal gel   Mouth/Throat QID PRN Izediuno, Laruth Bouchard, MD      . FLUoxetine (PROZAC) capsule 40 mg  40 mg Oral Daily Hisada, Elie Goody, MD   40 mg at 11/05/16 0813  . gabapentin (NEURONTIN) capsule 300 mg  300 mg Oral TID Artist Beach, MD   300 mg at 11/05/16 0813  . LORazepam (ATIVAN) tablet 1 mg  1 mg Oral Q6H PRN Norman Clay, MD      . LORazepam (ATIVAN) tablet 1 mg  1 mg Oral BID Norman Clay, MD   1 mg at 11/05/16 0813   Followed by  . [START ON 11/06/2016] LORazepam (ATIVAN) tablet 1 mg  1 mg Oral Daily Hisada, Reina, MD      . magnesium hydroxide (MILK OF MAGNESIA) suspension 30 mL  30 mL Oral Daily PRN Ethelene Hal, NP      . multivitamin with minerals tablet 1 tablet  1 tablet Oral Daily Hisada, Reina, MD   1 tablet at 11/05/16 0813  . naltrexone (DEPADE) tablet 50 mg  50 mg Oral Daily Izediuno, Laruth Bouchard, MD   50 mg at 11/05/16 0813  . naproxen (NAPROSYN) tablet 500 mg  500 mg Oral BID PRN Artist Beach, MD   500 mg at 11/04/16 1608  . nicotine (NICODERM CQ - dosed in mg/24 hours) patch 21 mg  21 mg Transdermal Daily Cobos, Myer Peer, MD   21 mg at 11/05/16  0813  . QUEtiapine (SEROQUEL) tablet 100 mg  100 mg Oral QHS Norman Clay, MD   100 mg at 11/04/16 2118  . thiamine (VITAMIN B-1) tablet 100 mg  100 mg Oral Daily Hisada, Reina, MD   100 mg at 11/05/16 0813    PTA Medications: Prescriptions Prior to Admission  Medication Sig Dispense Refill Last Dose  . QUEtiapine (SEROQUEL) 400 MG tablet Take 400 mg by mouth 2 (two) times daily.   0 10/30/2016 at Unknown time  . prazosin (MINIPRESS) 1 MG capsule Take 1 capsule (1 mg total) by mouth 2 (two) times daily. (Patient not taking: Reported on 11/02/2016) 60 capsule 1 Not Taking at Unknown time  . QUEtiapine (SEROQUEL) 300 MG tablet Take 1 tablet (300 mg total) by mouth at bedtime. (Patient not taking: Reported on 11/01/2016) 30 tablet 1 Not Taking at Unknown time  . QUEtiapine (SEROQUEL) 50 MG tablet Take 1 tablet (50 mg total) by mouth 3 (three) times daily. (Patient not taking: Reported on 11/01/2016) 90 tablet 1 Not Taking at Unknown time  . [DISCONTINUED] meloxicam (MOBIC) 7.5 MG tablet Take 7.5 mg by mouth every 12 (twelve) hours.  0     Treatment Modalities: Medication Management, Group therapy, Case management,  1 to 1 session with  clinician, Psychoeducation, Recreational therapy.  Patient Stressors: Arts development officer issue Loss of 54 year old son by drowning a few months ago Substance abuse Patient Strengths: Network engineer for treatment/growth Physical Health  Physician Treatment Plan for Primary Diagnosis: MDD (major depressive disorder), recurrent severe, without psychosis ([place]) Long Term Goal(s): Improvement in symptoms so as ready for discharge Short Term Goals:    Medication Management: Evaluate patient's response, side effects, and tolerance of medication regimen.  Therapeutic Interventions: 1 to 1 sessions, Unit Group sessions and Medication administration.  Evaluation of Outcomes: Adequate for Discharge  Physician  Treatment Plan for Secondary Diagnosis: Principal Problem:   MDD (major depressive disorder), recurrent severe, without psychosis ([place]) Active Problems:   Alcohol use disorder, severe, dependence ([place])  Long Term Goal(s): Improvement in symptoms so as ready for discharge  Short Term Goals:    Medication Management: Evaluate patient's response, side effects, and tolerance of medication regimen.  Therapeutic Interventions: 1 to 1 sessions, Unit Group sessions and Medication administration.  Evaluation of Outcomes: Adequate for Discharge  RN Treatment Plan for Primary Diagnosis: MDD (major depressive disorder), recurrent severe, without psychosis ([place]) Long Term Goal(s): Knowledge of disease and therapeutic regimen to maintain health will improve  Short Term Goals: Ability to identify and develop effective coping behaviors will improve and Compliance with prescribed medications will improve  Medication Management: RN will administer medications as ordered by provider, will assess and evaluate patient's response and provide education to patient for prescribed medication. RN will report any adverse and/or side effects to prescribing provider.  Therapeutic Interventions: 1 on 1 counseling sessions, Psychoeducation, Medication administration, Evaluate responses to treatment, Monitor vital signs and CBGs as ordered, Perform/monitor CIWA, COWS, AIMS and Fall Risk screenings as ordered, Perform wound care treatments as ordered.  Evaluation of Outcomes: Adequate for Discharge  LCSW Treatment Plan for Primary Diagnosis: MDD (major depressive disorder), recurrent severe, without psychosis ([place]) Long Term Goal(s): Safe transition to appropriate next level of care at discharge, Engage patient in therapeutic group addressing interpersonal concerns. Short Term Goals: Engage patient in aftercare planning with referrals and resources, Increase emotional regulation, Facilitate patient progression through  stages of change regarding substance use diagnoses and concerns, Identify triggers associated with mental health/substance abuse issues and Increase skills for wellness and recovery  Therapeutic Interventions: Assess for all discharge needs, 1 to 1 time with Social worker, Explore available resources and support systems, Assess for adequacy in community support network, Educate family and significant other(s) on suicide prevention, Complete Psychosocial Assessment, Interpersonal group therapy.  Evaluation of Outcomes: Adequate for Discharge  Progress in Treatment: Attending groups: Yes  Participating in groups: Yes Taking medication as prescribed: Yes, MD continues to assess for medication changes as needed Toleration medication: Yes, no side effects reported at this time Family/Significant other contact made: No, pt declined contact. Patient understands diagnosis: Yes, AEB pt's willingness to participate in treatment. Discussing patient identified problems/goals with staff: Yes Medical problems stabilized or resolved: Yes Denies suicidal/homicidal ideation: Yes Issues/concerns per patient self-inventory: None Other: N/A  New problem(s) identified: None identified at this time.   New Short Term/Long Term Goal(s): None identified at this time.   Discharge Plan or Barriers: Pt will return home and follow up outpatient with [facility].  Reason for Continuation of Hospitalization:  None identified at this time.  Estimated Length of Stay: 0 days; Pt will likely discharge today 11/05/16  Attendees: Patient: 11/05/2016 11:14 AM  Physician: Dr. Sanjuana Letters  11/05/2016 11:14 AM  Nursing: Fredricka Bonine, RN 11/05/2016 11:14 AM  RN Care Manager: Lars Pinks, RN 11/05/2016 11:14 AM  Social Worker: Matthew Saras, [place] 11/05/2016 11:14 AM  Recreational Therapist:  11/05/2016 11:14 AM  Other: Lindell Spar, NP 11/05/2016 11:14 AM  Other:  11/05/2016 11:14 AM  Other: 11/05/2016 11:14 AM   Scribe  for Treatment Team: Georga Kaufmann, MSW,LCSWA 11/05/2016 11:14 AM

## 2016-11-05 NOTE — Progress Notes (Signed)
D: Patient observed up in dayroom, interacting with peers. Patient verbalizes to this Probation officer her desire to discharge. "I feel ready." Patient's affect anxious with congruent mood which is also pleasant. Per self inventory and discussions with writer, rates depression at a 0/10, hopelessness at a 0/10 and anxiety at a 0/10. Rates sleep as good, appetite as good, energy as normal and concentration as good.  States goal for today is to "go home to my son and go to AA." Denies pain, physical problems.   A: Medicated per orders, no prns requested or required. Level III obs in place for safety. Emotional support offered and self inventory reviewed. Encouraged completion of Suicide Safety Plan and programming participation. Discussed POC with MD, SW.  Fall prevention plan in place and reviewed with patient as pt is a high fall risk due to hx of falls PTA as well as withdrawal related seizures (none this admit.)   R: Patient verbalizes understanding of POC, falls prevention education.  Patient denies SI/HI/AVH and remains safe on level III obs. Will continue to monitor closely and make verbal contact frequently. Awaiting disposition.

## 2016-11-05 NOTE — Progress Notes (Signed)
Patient verbalizes readiness for discharge. Follow up plan explained, AVS, transition record and SRA given along with prescriptions. All belongings returned. Patient verbalizes understanding. Denies SI/HI and assures this Probation officer she will seek assistance should that change. Patient discharged ambulatory and in stable condition with plan to call taxi (patient has means to pay for it.)
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
## 2016-11-05 NOTE — Progress Notes (Signed)
Pt observed in the dayroom most of the evening.  She has been interacting appropriately with peers. She denies SI/HI/AVH at this time.  She states she is still having mild to moderate withdrawal symptoms.  Writer reviewed her hs meds with her, and she seemed a little upset that the prns from the protocol were discontinued as she was expecting to receive the Vistaril at bedtime.  Writer told her that if she had trouble getting to sleep by midnight, then the night provider would be notified.  Pt seemed satisfied with this plan.  Interaction with writer has been minimal.  Pt was encouraged to make her needs known to staff.  Pt appropriate and cooperative.  Support and encouragement offered.  Discharge plans are in process.  Safety maintained with q15 minute checks.

## 2016-11-05 NOTE — Discharge Summary (Signed)
Physician Discharge Summary Note  Patient:  Laura [name] is an 46 y.o., female MRN:  [id number] DOB:  09/14/1970 Patient phone:  [phone] (home)   Patient address:   [address] [place] [address],  Total Time spent with patient: Greater than  30 minutes  Date of Admission:  11/01/2016 Date of Discharge: 11-05-16  Reason for Admission: Alcohol intoxication/suicidal ideations.  Principal Problem: MDD (major depressive disorder), recurrent severe, without psychosis [facility])  Discharge Diagnoses: Patient Active Problem List   Diagnosis Date Noted  . PTSD (post-traumatic stress disorder) [F43.10] 08/12/2016  . Major depressive disorder, recurrent severe without psychotic features ([place]) [F33.2] 08/11/2016  . Alcohol intoxication in active alcoholic without complication ([place]) [G26.948]   . Suicide attempt ([place]) [T14.91XA]   . Alcohol use disorder, severe, dependence ([place]) [F10.20] 07/23/2016  . 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 Hypokalemia [E87.6] 07/23/2016  . Prolonged Q-T interval on ECG [R94.31] 07/23/2016  . MDD (major depressive disorder), recurrent severe, without psychosis ([place]) [F33.2] 07/21/2016  . Osteoarthritis of spine with radiculopathy, cervical region [facility] 07/02/2016  . Substance abuse [F19.10] 07/02/2016  . Lumbar spondylosis [M47.816] 07/02/2016   Past Psychiatric History: Major depressive disorder, recurrent  Past Medical History:  Past Medical History:  Diagnosis Date  . Anxiety   . Substance abuse     Past Surgical History:  Procedure Laterality Date  . BACK SURGERY    . KNEE SURGERY     Family History:  Family History  Problem Relation Age of Onset  . Family history unknown: Yes   Family Psychiatric  History: See H&P  Social History:  History  Alcohol Use  . Yes    Comment: 3-4 pints a day     History  Drug Use No   Comment: denies     Social History   Social History  . Marital status: Single    Spouse name: N/A  . Number of children: N/A  . Years of education: N/A   Social History Main Topics  . Smoking status: Current Every Day Smoker    Packs/day: 1.00  . Smokeless tobacco: Never Used  . Alcohol use Yes     Comment: 3-4 pints a day  . Drug use: No     Comment: denies   . Sexual activity: No   Other Topics Concern  . None   Social History Narrative  . None   Hospital Course: 46 yo Caucasian female, single, lives with her foster father and her son, unemployed, on SSI. Background history of SUD and mood disorder. Recently lost her son via drowning. Presented to the ER via EMS. Was intoxicated with BAL 324 mg/dl. Expressed suicidal thoughts while intoxicated. Dismissive of suicidal thoughts when she sobered up. Major stressor as unexpected death of her son last 07-01-2022. She attempted suicide twice since then. Cut self once and overdosed the other time. No prior suicidal behavior. She has been expressing a lot of guilt, hopelessness and worthlessness. She has an upcoming court date for open container.  This is one of several discharge summaries from this [facility] for [facility] with known history of alcoholism, chronic, only that this time, she blamed her excessive drinking on the recent death of her son by drowning. Her UDS reports were negative of all substances, however, her BAL was 324 per toxicology tests results. On evaluation at the ED, Laura [name] admitted having been abusing alcohol. She also endorsed worsening depression &  suicidal ideations.She was in need of Alcohol detoxification & mood stabilization treatments.  After evaluation of her presenting symptoms, Laura [name] received Ativan detoxification treatment protocols for alcohol detox. She was also enrolled & participated in the group counseling sessions and AA/NA meetings being offered and held on this unit. She learned coping skills. Besides the  detoxification treatment, she was medicated & discharged on; Prozac 40 mg for depression, Gabapentin 300 mg for agitation/substance withdrawal syndrome, Naltrexone 50 mg for alcoholism, Seroquel 100 mg for mood control & Nicotine patch 21 mg for smoking cessation. She received other medication regimen for the other medical issues that she presented. She tolerated her treatment regimen without any adverse effects.  Laura [name] has completed detox treatment and her mood is stable. This is evidenced by her reports of improved mood and absence of alcohol withdrawal symptoms. She is currently being discharged to continue Substance abuse/mental health care on an outpatient basis as noted below. She is provided with all the necessary information needed to make this appointment without problems. Upon discharge, Laura [name] adamantly denies any SIHI, AVH, delusional thoughts, paranoia and or substance withdrawal symptoms. She left with all belongings in no apparent distress. Transportation per her arrangement.  Physical Findings: AIMS: Facial and Oral Movements Muscles of Facial Expression: None, normal Lips and Perioral Area: None, normal Jaw: None, normal Tongue: None, normal,Extremity Movements Upper (arms, wrists, hands, fingers): None, normal Lower (legs, knees, ankles, toes): None, normal, Trunk Movements Neck, shoulders, hips: None, normal, Overall Severity Severity of abnormal movements (highest score from questions above): None, normal Incapacitation due to abnormal movements: None, normal Patient's awareness of abnormal movements (rate only patient's report): No Awareness, Dental Status Current problems with teeth and/or dentures?: No Does patient usually wear dentures?: No  CIWA:  CIWA-Ar Total: 0 COWS:  COWS Total Score: 4  Musculoskeletal: Strength & Muscle Tone: within normal limits Gait & Station: normal Patient leans: N/A  Psychiatric Specialty Exam: Physical Exam  Constitutional: She  appears well-developed.  HENT:  Head: Normocephalic.  Eyes: Pupils are equal, round, and reactive to light.  Neck: Normal range of motion.  Cardiovascular: Normal rate.   Respiratory: Effort normal.  GI: Soft.  Genitourinary:  Genitourinary Comments: Deferred  Musculoskeletal: Normal range of motion.  Neurological: She is alert.  Skin: Skin is warm and dry.    Review of Systems  Constitutional: Negative.   HENT: Negative.   Eyes: Negative.   Respiratory: Negative.   Cardiovascular: Negative.   Gastrointestinal: Negative.   Genitourinary: Negative.   Musculoskeletal: Negative.   Skin: Negative.   Neurological: Negative.   Endo/Heme/Allergies: Negative.   Psychiatric/Behavioral: Positive for depression (Stable). Negative for hallucinations, substance abuse and suicidal ideas. The patient has insomnia (Stable). The patient is not nervous/anxious.     Blood pressure (!) 108/57, pulse (!) 107, temperature 97.7 F (36.5 C), temperature source Oral, resp. rate 18, height 5\' 8"  (1.727 m), weight 72.6 kg (160 lb).Body mass index is 24.33 kg/m.  See Md's SRA   Have you used any form of tobacco in the last 30 days? (Cigarettes, Smokeless Tobacco, Cigars, and/or Pipes): Yes  Has this patient used any form of tobacco in the last 30 days? (Cigarettes, Smokeless Tobacco, Cigars, and/or Pipes):Yes, provided with Nicotine patch prescription for smoking cessation.  Blood Alcohol level:  Lab Results  Component Value Date   ETH 324 [facility]) 10/31/2016   ETH 329 (HH) 93/81/0175   Metabolic Disorder Labs:  Lab Results  Component Value Date   HGBA1C 4.9 07/23/2016  MPG 94 07/23/2016   No results found for: PROLACTIN Lab Results  Component Value Date   CHOL 239 (H) 07/23/2016   TRIG 95 07/23/2016   HDL 87 07/23/2016   CHOLHDL 2.7 07/23/2016   VLDL 19 07/23/2016   LDLCALC 133 (H) 07/23/2016   See Psychiatric Specialty Exam and Suicide Risk Assessment completed by Attending Physician  prior to discharge.  Discharge destination:  Home  Is patient on multiple antipsychotic therapies at discharge:  No   Has Patient had three or more failed trials of antipsychotic monotherapy by history:  No  Recommended Plan for Multiple Antipsychotic Therapies: NA  Allergies as of 11/05/2016   No Known Allergies     Medication List    STOP taking these medications   prazosin 1 MG capsule Commonly known as:  MINIPRESS     TAKE these medications     Indication  benzocaine 10 % mucosal gel Commonly known as:  ORAJEL Use as directed in the mouth or throat 4 (four) times daily as needed for mouth pain (apply to tooth).  Indication:  Tooth pain   FLUoxetine 40 MG capsule Commonly known as:  PROZAC Take 1 capsule (40 mg total) by mouth daily. For depression  Indication:  Major Depressive Disorder   gabapentin 300 MG capsule Commonly known as:  NEURONTIN Take 1 capsule (300 mg total) by mouth 3 (three) times daily. For agitation What changed:  medication strength  how much to take  when to take this  additional instructions  Indication:  Agitation   naltrexone 50 MG tablet Commonly known as:  DEPADE Take 1 tablet (50 mg total) by mouth daily. For alcohol  Indication:  Excessive Use of Alcohol   nicotine 21 mg/24hr patch Commonly known as:  NICODERM CQ - dosed in mg/24 hours Place 1 patch (21 mg total) onto the skin daily. For smoking cessation  Indication:  Nicotine Addiction   QUEtiapine 100 MG tablet Commonly known as:  SEROQUEL Take 1 tablet (100 mg total) by mouth at bedtime. For mood control What changed:  medication strength  how much to take  additional instructions  Another medication with the same name was removed. Continue taking this medication, and follow the directions you see here.  Indication:  Mood control      Follow-up [facility] Follow up on 11/07/2016.   Specialty:  Behavioral  Health Why:  Assessment appointment 8/22 at [facility] for Substance Abuse Intensive Outpatient Program. Contact information: [place] [phone]       [facility] Follow up.   Specialty:  Behavioral Health Why:  Patient has accepted Transitional Care Team Services, case management will begin on day of discharge.   Contact information: [place] [address] [phone]          Follow-up recommendations: Activity:  As tolerated Diet: As recommended by your primary care doctor. Keep all scheduled follow-up appointments as recommended.   Comments: Patient is instructed prior to discharge to: Take all medications as prescribed by his/her mental healthcare provider. Report any adverse effects and or reactions from the medicines to his/her outpatient provider promptly. Patient has been instructed & cautioned: To not engage in alcohol and or illegal drug use while on prescription medicines. In the event of worsening symptoms, patient is instructed to call the crisis hotline, 911 and or go to the nearest ED for appropriate evaluation and treatment of symptoms.  To follow-up with his/her primary care provider for your other medical issues, concerns and or health care needs.   Signed: Encarnacion Slates, NP, PMHNP, FNP-BC 11/05/2016, 1:10 PM

## 2016-11-05 NOTE — BHH Suicide Risk Assessment (Signed)
[facility] Discharge Suicide Risk Assessment   Principal Problem: MDD (major depressive disorder), recurrent severe, without psychosis ([place]) Discharge Diagnoses: SIMD Patient Active Problem List   Diagnosis Date Noted  . PTSD (post-traumatic stress disorder) [F43.10] 08/12/2016  . Major depressive disorder, recurrent severe without psychotic features ([place]) [F33.2] 08/11/2016  . Alcohol intoxication in active alcoholic without complication ([place]) [A35.573]   . Suicide attempt ([place]) [T14.91XA]   . Alcohol use disorder, severe, dependence ([place]) [F10.20] 07/23/2016  . 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 Hypokalemia [E87.6] 07/23/2016  . Prolonged Q-T interval on ECG [R94.31] 07/23/2016  . MDD (major depressive disorder), recurrent severe, without psychosis ([place]) [F33.2] 07/21/2016  . Osteoarthritis of spine with radiculopathy, cervical region [facility] 07/02/2016  . Substance abuse [F19.10] 07/02/2016  . Lumbar spondylosis [M47.816] 07/02/2016    Total Time spent with patient: 30 minutes  Musculoskeletal: Strength & Muscle Tone: within normal limits Gait & Station: normal Patient leans: N/A  Psychiatric Specialty Exam: Review of Systems  Constitutional: Negative.   HENT: Negative.   Eyes: Negative.   Respiratory: Negative.   Cardiovascular: Negative.   Gastrointestinal: Negative.   Genitourinary: Negative.   Musculoskeletal: Negative.   Skin: Negative.   Neurological: Negative.   Endo/Heme/Allergies: Negative.   Psychiatric/Behavioral: Negative for depression, hallucinations, memory loss, substance abuse and suicidal ideas. The patient is not nervous/anxious and does not have insomnia.     Blood pressure (!) 108/57, pulse (!) 107, temperature 97.7 F (36.5 C), temperature source Oral, resp. rate 18, height 5\' 8"  (1.727 m), weight 72.6 kg (160 lb).Body mass index is 24.33 kg/m.  General Appearance:  Neatly dressed, pleasant, engaging well and cooperative. Appropriate behavior. Not in any distress. Good relatedness. Not internally stimulated  Eye Contact::  Good  Speech:  Spontaneous, normal prosody. Normal tone and rate.   Volume:  Normal  Mood:  Euthymic  Affect:  Appropriate and Full Range  Thought Process:  Goal Directed and Linear  Orientation:  Full (Time, Place, and Person)  Thought Content:  Future oriented. No delusional theme. No preoccupation with violent thoughts. No negative ruminations. No obsession.  No hallucination in any modality.   Suicidal Thoughts:  No  Homicidal Thoughts:  No  Memory:  Immediate;   Good Recent;   Good Remote;   Good  Judgement:  Good  Insight:  Good  Psychomotor Activity:  Normal  Concentration:  Good  Recall:  Good  Fund of Knowledge:Good  Language: Good  Akathisia:  Negative  Handed:    AIMS (if indicated):     Assets:  Communication Skills Desire for Improvement Housing Physical Health Resilience Social Support  Sleep:  Number of Hours: 6.25  Cognition: WNL  ADL's:  Intact   Clinical Assessment::   46 yo Caucasian female, single, lives with her foster father and her son, unemployed, on SSI. Background history of SUD and mood disorder. Recently lost her son via drowning. Presented to the ER via EMS. Was intoxicated with BAL 324 mg/dl. Expressed suicidal thoughts while intoxicated. Dismissive of suicidal thoughts when she sobered up. Major stressor as unexpected death of her son last 2022-06-25. She attempted suicide twice since then. Cut self once and overdosed the other time. No prior suicidal behavior. She has been expressing a lot of guilt, hopelessness and worthlessness. She has an upcoming court date for open container.  Seen today. Says she has completely come off alcohol. No residual withdrawal symptoms. Says she plans to attend  IOP and grief therapy. Says she definitely is not suicidal. She has a 21 yo son that she needs to  support. Says she would be going back to her foster father's place which is where her son lives. She hopes to get a place of her own later. Patient is not depressed. Reports normal energy. Says she has been sleeping well at night. She is eating normally. No evidence of mania. No evidence of psychosis. No overwhelming anxiety. No cravings for substances. She is tolerating her medications well. No access to weapons. She as consented for her SW to call her family and finalize discharge plans.   Nursing staff reports that patient has been appropriate on the unit. Patient has been interacting well with peers. No behavioral issues. Patient has not voiced any suicidal thoughts. Patient has not been observed to be internally stimulated. Patient has been adherent with treatment recommendations. Patient has been tolerating their medication well.   Patient was discussed at team. Team members feels that patient is back to her baseline level of function. Team agrees with plan to discharge patient today.  Demographic Factors:  Unemployed  Loss Factors: Loss of significant relationship  Historical Factors: Impulsivity  Risk Reduction Factors:   Sense of responsibility to family, Religious beliefs about death, Living with another person, especially a relative, Positive social support, Positive therapeutic relationship and Positive coping skills or problem solving skills  Continued Clinical Symptoms:   As above   Cognitive Features That Contribute To Risk:  None    Suicide Risk:  Minimal: No identifiable suicidal ideation.  Patient is not having any thoughts of suicide at this time. Modifiable risk factors targeted during this admission includes depression, bereavement and substance use. Demographical and historical risk factors cannot be modified. Patient is now engaging well. Patient is reliable and is future oriented. We have buffered patient's support structures. At this point, patient is at low risk of  suicide. Patient is aware of the effects of psychoactive substances on decision making process. Patient has been provided with emergency contacts. Patient acknowledges to use resources provided if unforseen circumstances changes their current risk stratification.   Follow-up Information    [facility] Follow up on 11/07/2016.   Specialty:  Behavioral Health Why:  Assessment appointment 8/22 at [facility] for Substance Abuse Intensive Outpatient Program. Contact information: [place] [phone]       [facility] Follow up.   Specialty:  Behavioral Health Why:  Patient has accepted Transitional Care Team Services, case management will begin on day of discharge.   Contact information: [place] [address] [phone]           Plan Of Care/Follow-up recommendations:  1. Continue current psychotropic medications 2. Mental health and addiction follow up as arranged.  3.  Provided limited quantity of prescriptions  [place], MD 11/05/2016, 11:29 AM

## 2016-11-07 ENCOUNTER — Ambulatory Visit (HOSPITAL_COMMUNITY): Payer: Self-pay | Admitting: Licensed Clinical Social Worker

## 2016-12-11 ENCOUNTER — Telehealth: Payer: Self-pay | Admitting: Family Medicine

## 2016-12-11 ENCOUNTER — Ambulatory Visit (INDEPENDENT_AMBULATORY_CARE_PROVIDER_SITE_OTHER): Payer: Medicare Other | Admitting: Family Medicine

## 2016-12-11 VITALS — BP 120/88 | HR 96 | Ht 68.0 in | Wt 153.4 lb

## 2016-12-11 DIAGNOSIS — F332 Major depressive disorder, recurrent severe without psychotic features: Secondary | ICD-10-CM

## 2016-12-11 DIAGNOSIS — Z114 Encounter for screening for human immunodeficiency virus [HIV]: Secondary | ICD-10-CM

## 2016-12-11 DIAGNOSIS — Z1159 Encounter for screening for other viral diseases: Secondary | ICD-10-CM

## 2016-12-11 DIAGNOSIS — N912 Amenorrhea, unspecified: Secondary | ICD-10-CM | POA: Diagnosis not present

## 2016-12-11 MED ORDER — DULOXETINE HCL 30 MG PO CPEP: 30.0000 mg | ORAL_CAPSULE | Freq: Every day | ORAL | 3 refills | Status: DC

## 2016-12-11 MED ORDER — PREGABALIN 150 MG PO CAPS: 150.0000 mg | ORAL_CAPSULE | Freq: Two times a day (BID) | ORAL | 11 refills | Status: DC

## 2016-12-11 NOTE — Assessment & Plan Note (Signed)
PHQ and GAD significant elevated today. No SI or HI. We willstop the Effexor. start Cymbalta and Lyrica. Referral to psychiatry placed today. Follow-up in 2-3 weeks.

## 2016-12-11 NOTE — Progress Notes (Signed)
    Subjective:  Laura [name] is a 46 y.o. female who presents today with a chief complaint of depression.  HPI:  Depression/Anxiety, Chronic, Worsening Chronic problem for patient for several years. Recently worsened by the passing of her son and best friend. She was hospitalized about a month ago for her depression, and was discharged on Effexor 150 mg daily and Seroquel 400 mg daily. Those are her only current medications. Over the past several weeks she has noticed worsening anxiety symptoms as well as tremor and dizziness. She has been on several medications for depression in the past including Prozac. She has also been on Vistaril and Xanax in the past for her anxiety symptoms. She has also tried BuSpar which is not effective. She has previously been on Cymbalta and Lyrica, but does not remember how she reacted this.  Depression screen PHQ 2/9 12/11/2016  Decreased Interest 3  Down, Depressed, Hopeless 3  PHQ - 2 Score 6  Altered sleeping 1  Tired, decreased energy 3  Change in appetite 2  Feeling bad or failure about yourself  1  Trouble concentrating 0  Moving slowly or fidgety/restless 0  Suicidal thoughts 0  PHQ-9 Score 13  Difficult doing work/chores Not difficult at all   GAD 7 : Generalized Anxiety Score 12/11/2016  Nervous, Anxious, on Edge 3  Control/stop worrying 3  Worry too much - different things 3  Trouble relaxing 3  Restless 1  Easily annoyed or irritable 0  Afraid - awful might happen 2  Total GAD 7 Score 15  Anxiety Difficulty Somewhat difficult    Hepatitis C Patient reports having positive hepatitis C labs in the past. She would like to be retested today.  Amenorrhea Patient also reports that she has not had a period and approximately 5 months. She is concerned that she is going through menopause. She has not been sexually active over that time.  ROS: Per HPI  PMH: Smoking history reviewed. Current smoker.   Objective:  Physical Exam: BP  120/88   Pulse 96   Ht 5\' 8"  (1.727 m)   Wt 153 lb 6.4 oz (69.6 kg)   SpO2 99%   BMI 23.32 kg/m   Gen: NAD, resting comfortably Neuro: Grossly normal, moves all extremities Psych:  Intermittently tearful. Depressed mood.   Assessment/Plan:  MDD (major depressive disorder), recurrent severe, without psychosis ([place]) PHQ and GAD significant elevated today. No SI or HI. We willstop the Effexor. start Cymbalta and Lyrica. Referral to psychiatry placed today. Follow-up in 2-3 weeks.  Hepatitis C Check hepatitis C antibody today with reflex HCV RNA  Preventative healthcare Flu shot deferred. Check HIV antibody today. Patient will return soon for her Pap test.  Algis Greenhouse. Jerline Pain, MD 12/11/2016 3:08 PM

## 2016-12-11 NOTE — Patient Instructions (Signed)
Stop the effexor.  Start lyica and cymbalta.  Come back in a few weeks for a follow up, or sooner as needed.  We will check blood work today.  Take care,  Dr Jerline Pain

## 2016-12-11 NOTE — Telephone Encounter (Signed)
Noted  

## 2016-12-11 NOTE — Telephone Encounter (Signed)
Patient Name: Laura [name]  DOB: 1970-08-14    Initial Comment Caller states she needs to make an apt. She states she is on a new medication for about a month and she is now getting dizzy spells, she gets shaky.    Nurse Assessment  Nurse: Holly Bodily, RN, [place] Date/Time (Eastern Time): 12/11/2016 11:07:07 AM  Confirm and document reason for call. If symptomatic, describe symptoms. ---Caller states she needs to make an apt. She states she is on a new medication for about a month and she is now getting dizzy spells, she gets shaky. no fever or other symptoms  Does the patient have any new or worsening symptoms? ---Yes  Will a triage be completed? ---Yes  Related visit to physician within the last 2 weeks? ---No  Does the PT have any chronic conditions? (i.e. diabetes, asthma, etc.) ---Yes  List chronic conditions. ---Hypotension, Hep C, Chronic back pain secondary to previous surgeries, Depression after son's passing  Is the patient pregnant or possibly pregnant? (Ask all females between the ages of 48-55) ---No  Is this a behavioral health or substance abuse call? ---No     Guidelines    Guideline Title Affirmed Question Affirmed Notes  Dizziness - Lightheadedness [1] MODERATE dizziness (e.g., interferes with normal activities) AND [2] has NOT been evaluated by physician for this (Exception: dizziness caused by heat exposure, sudden standing, or poor fluid intake)    Final Disposition User   See Physician within 24 Hours [place], RN, Nicci    Comments  Patient scheduled for 1:45 today with Dr.Parker at the [facility].  Update. Visit is actually scheduled for 2:15 today and patient was made aware.   Referrals  REFERRED TO PCP OFFICE   Caller Disagree/Comply Comply  Caller Understands Yes  PreDisposition Call Doctor

## 2016-12-11 NOTE — Telephone Encounter (Signed)
Patient called in with symptoms of dizziness and feeling like she is going to pass out. Patient was seeing Tor Netters. Sent patient to triage. Awaiting note from Triage.

## 2016-12-12 LAB — LUTEINIZING HORMONE: LH: 47.52 m[IU]/mL

## 2016-12-12 LAB — HIV ANTIBODY (ROUTINE TESTING W REFLEX): HIV: NONREACTIVE

## 2016-12-12 LAB — FOLLICLE STIMULATING HORMONE: FSH: 44.9 m[IU]/mL

## 2016-12-12 LAB — TSH: TSH: 1.54 u[IU]/mL (ref 0.35–4.50)

## 2016-12-13 ENCOUNTER — Telehealth: Payer: Self-pay | Admitting: Family Medicine

## 2016-12-13 NOTE — Telephone Encounter (Signed)
Called and spoke with pt informing her that results have not yet came back. Understanding verbalized nothing further needed at this time.

## 2016-12-13 NOTE — Telephone Encounter (Signed)
Patient called in reference to lab results. Please call patient and advise. OK to leave message.

## 2016-12-14 LAB — HCV RNA,QUANTITATIVE REAL TIME PCR
HCV QUANT LOG: 6.06 {Log_IU}/mL — AB
HCV RNA, PCR, QN: 1160000 [IU]/mL — AB

## 2016-12-14 LAB — HEPATITIS C ANTIBODY
HEP C AB: REACTIVE — AB
SIGNAL TO CUT-OFF: 32 — ABNORMAL HIGH (ref <1.00)

## 2016-12-17 ENCOUNTER — Ambulatory Visit (INDEPENDENT_AMBULATORY_CARE_PROVIDER_SITE_OTHER): Payer: Medicare Other | Admitting: Family Medicine

## 2016-12-17 ENCOUNTER — Other Ambulatory Visit (HOSPITAL_COMMUNITY): Payer: Medicare Other | Attending: Psychiatry

## 2016-12-17 ENCOUNTER — Telehealth: Payer: Self-pay | Admitting: Family Medicine

## 2016-12-17 ENCOUNTER — Telehealth: Payer: Self-pay | Admitting: *Deleted

## 2016-12-17 ENCOUNTER — Encounter: Payer: Self-pay | Admitting: Family Medicine

## 2016-12-17 ENCOUNTER — Other Ambulatory Visit: Payer: Self-pay | Admitting: Family Medicine

## 2016-12-17 ENCOUNTER — Telehealth (HOSPITAL_COMMUNITY): Payer: Self-pay | Admitting: Professional

## 2016-12-17 VITALS — BP 128/76 | HR 101 | Temp 98.5°F | Wt 163.5 lb

## 2016-12-17 DIAGNOSIS — R55 Syncope and collapse: Secondary | ICD-10-CM

## 2016-12-17 DIAGNOSIS — B1920 Unspecified viral hepatitis C without hepatic coma: Secondary | ICD-10-CM

## 2016-12-17 DIAGNOSIS — R251 Tremor, unspecified: Secondary | ICD-10-CM | POA: Diagnosis not present

## 2016-12-17 LAB — POCT URINALYSIS DIPSTICK
BILIRUBIN UA: NEGATIVE
Blood, UA: NEGATIVE
GLUCOSE UA: NEGATIVE
KETONES UA: NEGATIVE
Leukocytes, UA: NEGATIVE
NITRITE UA: NEGATIVE
Protein, UA: NEGATIVE
Spec Grav, UA: 1.015 (ref 1.010–1.025)
Urobilinogen, UA: 0.2 E.U./dL
pH, UA: 6 (ref 5.0–8.0)

## 2016-12-17 LAB — CBC
HCT: 39.9 % (ref 36.0–46.0)
HEMOGLOBIN: 13.2 g/dL (ref 12.0–15.0)
MCHC: 33 g/dL (ref 30.0–36.0)
MCV: 95.9 fl (ref 78.0–100.0)
Platelets: 242 10*3/uL (ref 150.0–400.0)
RBC: 4.16 Mil/uL (ref 3.87–5.11)
RDW: 14.2 % (ref 11.5–15.5)
WBC: 9.5 10*3/uL (ref 4.0–10.5)

## 2016-12-17 LAB — POCT URINE PREGNANCY: Preg Test, Ur: NEGATIVE

## 2016-12-17 NOTE — Patient Instructions (Signed)
I will send in a referral to the neurologist and cardiologist.  Take care,  Dr Jerline Pain

## 2016-12-17 NOTE — Progress Notes (Signed)
Subjective:  Laura [name] is a 46 y.o. female who presents today with a chief complaint of dizziness and syncope.   HPI:  Dizziness and Syncope, Acute Problem Patient was seen about 1 week ago and started on lyrica and cymbalta for her depression and anxiety.Over the past week she has noticed increased feelings of shakiness. She has actually had a couple of episodes where she has felt dizzy and subsequently lost consciousness for an unclear amount of time. Both of these episodes were witnessed. Patient reports waking up soon after hitting the ground. Denies any sort of seizure-like activity, however she reports that she has had 2 seizures in the past. The seizures were evaluated by physicians. Currently she is having tremors in her arms and legs. No headaches. No weakness or numbness. She is having some difficulty speaking.  ROS: Per HPI  PMH: Smoking history reviewed. Current smoker.  Objective:  Physical Exam: BP 128/76   Pulse (!) 101   Temp 98.5 F (36.9 C) (Oral)   Wt 163 lb 8 oz (74.2 kg)   SpO2 96%   BMI 24.86 kg/m    Orthostatic VS for the past 24 hrs (Last 3 readings):  BP- Lying Pulse- Lying BP- Sitting Pulse- Sitting BP- Standing at 0 minutes Pulse- Standing at 0 minutes  12/17/16 1357 128/78 92 126/72 97 116/72 104  Gen: NAD, resting comfortably HEENT: TMs clear bilaterally. Dry appearing mucous membranes. Pupils constricted to about 3 mm bilaterally. Reactive to light bilaterally. CV: RRR with no murmurs appreciated. No bruits. Pulm: NWOB, CTAB with no crackles, wheezes, or rhonchi Neuro: cranial nerves II through XII intact. Strength 5 out of 5 in upper and lower extremities. Sensation to light touch grossly intact. She does have a fine tremor in both of her upper extremities and lower extremities. Reflexes 1+ and symmetric bilaterally in upper and lower extremities.  Results for orders placed or performed in visit on 12/17/16 (from the past 72 hour(s))    POCT urine pregnancy     Status: None   Collection Time: 12/17/16  1:47 PM  Result Value Ref Range   Preg Test, Ur Negative Negative  POCT urinalysis dipstick     Status: None   Collection Time: 12/17/16  1:48 PM  Result Value Ref Range   Color, UA Yellow    Clarity, UA Clear    Glucose, UA Negative    Bilirubin, UA Negative    Ketones, UA Negative    Spec Grav, UA 1.015 1.010 - 1.025   Blood, UA Negative    pH, UA 6.0 5.0 - 8.0   Protein, UA Negative    Urobilinogen, UA 0.2 0.2 or 1.0 E.U./dL   Nitrite, UA Negative    Leukocytes, UA Negative Negative   Assessment/Plan:  Syncope Unclear etiology. Patient's orthostatic vital signs here are normal. Her UA is normal. Her pregnancy test negative. She recently had a TSH which was normal also. Differential includes cardiogenic syncope, seizure, and vasovagal syncope. Her neurological exam today is significant for findings of tremor in her arms and legs. Her exam is otherwise normal. Given her neurological symptoms in addition to reported history of seizure, we'll refer to neurology. While her symptoms are not entirely consistent with cardiogenic syncope, this cannot be completely ruled out. Will place referral to cardiology for further investigation into possible cardiogenic causes of her syncope. We will also check CBC and urine drug screen. Strict return precautions reviewed.   Algis Greenhouse. Jerline Pain, MD 12/17/2016 1:51  PM  

## 2016-12-17 NOTE — Telephone Encounter (Signed)
Patient called back to inform the office that she forgot to mention during the first note that she began to get very dizzy yesterday and actually passed out for quite some time. I advised the patient that passing out/dizziness.fainting is a trigger word for [place] and I transferred the call to teamhealth to advise rather than simply making an appointment. Office on standby for triage note.

## 2016-12-17 NOTE — Telephone Encounter (Signed)
Patient requesting call back about lab results.

## 2016-12-17 NOTE — Telephone Encounter (Signed)
Patient called and states she is calling to inquire about her lab results . Patient is requesting a call back. Her contact number is [phone]. Please advise. Thank you

## 2016-12-17 NOTE — Telephone Encounter (Signed)
The results note, patient has early been informed of results.  Algis Greenhouse. Jerline Pain, MD 12/17/2016 11:10 AM

## 2016-12-17 NOTE — Telephone Encounter (Signed)
[facility]  Patient Name: Laura [name]  DOB: [birth year]    Initial Comment Been feeling dizzy and fell, fainted.   Nurse Assessment  Nurse: Harlow Mares, RN, Suanne Marker Date/Time Eilene Ghazi Time): 12/17/2016 12:06:07 PM  Confirm and document reason for call. If symptomatic, describe symptoms. ---Been feeling dizzy and fell, fainted.2 days ago. Continues to feel dizzy and nauseated. Recently placed on Cymbalta  Does the patient have any new or worsening symptoms? ---Yes  Will a triage be completed? ---Yes  Related visit to physician within the last 2 weeks? ---Yes  Does the PT have any chronic conditions? (i.e. diabetes, asthma, etc.) ---Yes  List chronic conditions. ---anxiety;  Is the patient pregnant or possibly pregnant? (Ask all females between the ages of 83-55) ---No  Is this a behavioral health or substance abuse call? ---No     Guidelines    Guideline Title Affirmed Question Affirmed Notes  Dizziness - Lightheadedness SEVERE dizziness (e.g., unable to stand, requires support to walk, feels like passing out now)    Final Disposition User   Go to ED Now (or PCP triage) Harlow Mares, RN, Suanne Marker    Comments  Scheduled patient to see Dr. Jerline Pain today at [facility] at 1:15pm.   Referrals  REFERRED TO PCP OFFICE   Caller Disagree/Comply Comply  Caller Understands Yes  PreDisposition Did not know what to do

## 2016-12-17 NOTE — Telephone Encounter (Signed)
See lab results.  

## 2016-12-17 NOTE — Telephone Encounter (Signed)
Patient calling in reference to lab results. Please call patient and advise. OK to leave message.

## 2016-12-18 ENCOUNTER — Encounter: Payer: Self-pay | Admitting: *Deleted

## 2016-12-18 ENCOUNTER — Ambulatory Visit: Payer: Medicare Other | Admitting: Cardiovascular Disease

## 2016-12-19 ENCOUNTER — Telehealth: Payer: Self-pay | Admitting: Family Medicine

## 2016-12-19 ENCOUNTER — Encounter: Payer: Self-pay | Admitting: Neurology

## 2016-12-19 NOTE — Telephone Encounter (Signed)
Patient called in reference to referral for neurology. Patient stated this is an emergency and neurology stated they do not have anything available until 03/11/17. Patient stated that neurology informed her, her appointment was not high priority. Patient would like to know if she can be referred somewhere else. Please call patient and advise.

## 2016-12-20 ENCOUNTER — Ambulatory Visit: Payer: Self-pay | Admitting: Cardiology

## 2016-12-21 LAB — DRUGS OF ABUSE SCREEN W/O ALC, ROUTINE URINE
AMPHETAMINES (1000 ng/mL SCRN): NEGATIVE
BARBITURATES: POSITIVE — AB
BENZODIAZEPINES: NEGATIVE
COCAINE METABOLITES: NEGATIVE
MARIJUANA MET (50 NG/ML SCRN): NEGATIVE
METHADONE: NEGATIVE
METHAQUALONE: NEGATIVE
OPIATES: NEGATIVE
PHENCYCLIDINE: NEGATIVE
PROPOXYPHENE: NEGATIVE

## 2016-12-21 NOTE — Telephone Encounter (Signed)
Referral sent to [facility], will see what their wait time for appointment is before canceling with [facility].

## 2016-12-24 ENCOUNTER — Encounter: Payer: Self-pay | Admitting: *Deleted

## 2016-12-28 ENCOUNTER — Ambulatory Visit: Payer: Medicare Other | Admitting: Family Medicine

## 2016-12-28 ENCOUNTER — Telehealth: Payer: Self-pay

## 2016-12-28 NOTE — Telephone Encounter (Signed)
Tried calling pt again with no answer. Appt was cancelled.

## 2016-12-28 NOTE — Telephone Encounter (Signed)
Patient is currently scheduled today at 1:30, per last visit she was to follow up with psychiatry. Appt scheduled for 12/17/2016 but was No Showed. I reached out to her this morning, explained that she needed to see other MD. She stated she never received a phone call. The phone then disconnected. I tried calling back with no answer.

## 2016-12-31 ENCOUNTER — Telehealth: Payer: Self-pay | Admitting: Family Medicine

## 2016-12-31 MED ORDER — QUETIAPINE FUMARATE 400 MG PO TABS: ORAL_TABLET | ORAL | 0 refills | Status: DC

## 2016-12-31 NOTE — Telephone Encounter (Signed)
Patient would like call back about lab results.    She also  needs to have her QUEtiapine (SEROQUEL) 400 MG tablet refilled. Patient uses  [id number] - , [place] [phone] (Phone) [phone] (Fax)   Patient would also like the phone number for who she was referred to for psychiatry. I do not see a number on the referral.

## 2016-12-31 NOTE — Telephone Encounter (Signed)
Patient called and was concerned about getting seen with her referrals for infectious disease and for psychiatry.  I provided the patient the number for her to call to be scheduled with the [facility] for Infectious Disease [phone].   I advised the patient that I saw that she refused the services that were provided for her referral for psychiatry with [facility]. There was an appointment made on 12/17/16 for their PHP program and the patient was a no show and did not answer the call that was made by their office to reschedule. The patient explained that "it was a few weeks course of classes that was mandatory but she thought that it was too much."  Call patient as soon as possible regarding other alternatives for psychiatry and discuss more in depth.

## 2016-12-31 NOTE — Progress Notes (Deleted)
Cardiology Office Note    Date:  12/31/2016   ID:  Laura [name], DOB 1970-09-16, MRN [id number]  PCP:  Vivi Barrack, MD  Cardiologist: New to [facility] - Dr. Rayne Du chief complaint on file.   History of Present Illness:    Laura [name] is a 46 y.o. female with past medical history of Hepatitis C, depression, anxiety who presents to the office today for evaluation of syncope at the request of Dr. Jerline Pain.  She was recently evaluated by her PCP on 12/17/2016 and reported noticing worsening dizziness and one episode of syncope after having recently started Lyrica and Cymbalta. CBC and TSH were recently checked and showed no acute abnormalities. An EKG was not obtained at the time of her visit.     Past Medical History:  Diagnosis Date  . Anxiety   . Substance abuse [facility])     Past Surgical History:  Procedure Laterality Date  . BACK SURGERY    . KNEE SURGERY      Current Medications: Outpatient Medications Prior to Visit  Medication Sig Dispense Refill  . pregabalin (LYRICA) 150 MG capsule Take 1 capsule (150 mg total) by mouth 2 (two) times daily. 60 capsule 11  . QUEtiapine (SEROQUEL) 400 MG tablet Takes 1 tablet in the am, 1 at noon and 2 at bedtime.  0   No facility-administered medications prior to visit.      Allergies:   Patient has no known allergies.   Social History   Social History  . Marital status: Single    Spouse name: N/A  . Number of children: N/A  . Years of education: N/A   Social History Main Topics  . Smoking status: Current Every Day Smoker    Packs/day: 1.00  . Smokeless tobacco: Never Used  . Alcohol use Yes     Comment: 3-4 pints a day  . Drug use: No     Comment: denies   . Sexual activity: No   Other Topics Concern  . Not on file   Social History Narrative  . No narrative on file     Family History:  The patient's ***Family history is unknown by patient.   Review of Systems:   Please see the history of present  illness.     General:  No chills, fever, night sweats or weight changes.  Cardiovascular:  No chest pain, dyspnea on exertion, edema, orthopnea, palpitations, paroxysmal nocturnal dyspnea. Dermatological: No rash, lesions/masses Respiratory: No cough, dyspnea Urologic: No hematuria, dysuria Abdominal:   No nausea, vomiting, diarrhea, bright red blood per rectum, melena, or hematemesis Neurologic:  No visual changes, wkns, changes in mental status. All other systems reviewed and are otherwise negative except as noted above.   Physical Exam:    VS:  There were no vitals taken for this visit.   General: Well developed, well nourished,female appearing in no acute distress. Head: Normocephalic, atraumatic, sclera non-icteric, no xanthomas, nares are without discharge.  Neck: No carotid bruits. JVD not elevated.  Lungs: Respirations regular and unlabored, without wheezes or rales.  Heart: ***Regular rate and rhythm. No S3 or S4.  No murmur, no rubs, or gallops appreciated. Abdomen: Soft, non-tender, non-distended with normoactive bowel sounds. No hepatomegaly. No rebound/guarding. No obvious abdominal masses. Msk:  Strength and tone appear normal for age. No joint deformities or effusions. Extremities: No clubbing or cyanosis. No edema.  Distal pedal pulses are 2+ bilaterally. Neuro: Alert and oriented X 3. Moves all extremities  spontaneously. No focal deficits noted. Psych:  Responds to questions appropriately with a normal affect. Skin: No rashes or lesions noted  Wt Readings from Last 3 Encounters:  12/17/16 163 lb 8 oz (74.2 kg)  12/11/16 153 lb 6.4 oz (69.6 kg)  07/02/16 158 lb 3.2 oz (71.8 kg)        Studies/Labs Reviewed:   EKG:  EKG is*** ordered today.  The ekg ordered today demonstrates ***  Recent Labs: 11/04/2016: ALT 42; BUN 9; Creatinine, Ser 0.72; Potassium 4.0; Sodium 141 12/11/2016: TSH 1.54 12/17/2016: Hemoglobin 13.2; Platelets 242.0   Lipid Panel    Component  Value Date/Time   CHOL 239 (H) 07/23/2016 0607   TRIG 95 07/23/2016 0607   HDL 87 07/23/2016 0607   CHOLHDL 2.7 07/23/2016 0607   VLDL 19 07/23/2016 0607   LDLCALC 133 (H) 07/23/2016 0607    Additional studies/ records that were reviewed today include:   EKG: 10/31/2016: NSR, HR 75, with no acute ST or T-wave changes.   Assessment:    No diagnosis found.   Plan:   In order of problems listed above:  1. ***    Medication Adjustments/Labs and Tests Ordered: Current medicines are reviewed at length with the patient today.  Concerns regarding medicines are outlined above.  Medication changes, Labs and Tests ordered today are listed in the Patient Instructions below. There are no Patient Instructions on file for this visit.   Signed, Erma Heritage, PA-C  12/31/2016 7:37 AM    [address], [place], [place]  [address] Phone: [phone]; Fax: [phone]  [address], [place], [place] [address] Phone: [phone]

## 2016-12-31 NOTE — Telephone Encounter (Signed)
Medication refilled for patient. I provided patient with the Infectious disease phone numbers and behavioral health phone numbers to call and reschedule appointments.

## 2017-01-01 ENCOUNTER — Ambulatory Visit: Payer: Medicare Other | Admitting: Student

## 2017-01-01 ENCOUNTER — Ambulatory Visit (HOSPITAL_COMMUNITY): Payer: Self-pay

## 2017-01-01 ENCOUNTER — Ambulatory Visit: Payer: Self-pay | Admitting: Internal Medicine

## 2017-01-01 ENCOUNTER — Other Ambulatory Visit (HOSPITAL_COMMUNITY): Payer: Medicare Other

## 2017-01-01 NOTE — Telephone Encounter (Signed)
Tried calling patient with no answer. 

## 2017-01-02 NOTE — Telephone Encounter (Signed)
Left message for patient to call back  

## 2017-01-03 ENCOUNTER — Encounter: Payer: Self-pay | Admitting: *Deleted

## 2017-01-07 ENCOUNTER — Encounter (HOSPITAL_COMMUNITY): Payer: Self-pay | Admitting: Emergency Medicine

## 2017-01-07 ENCOUNTER — Emergency Department (HOSPITAL_COMMUNITY): Payer: Medicare Other

## 2017-01-07 ENCOUNTER — Inpatient Hospital Stay (HOSPITAL_COMMUNITY)
Admission: EM | Admit: 2017-01-07 | Discharge: 2017-01-14 | DRG: 917 | Disposition: A | Discharge status: Other Acute Inpatient Hospital | Payer: Medicare Other | Attending: Internal Medicine | Admitting: Internal Medicine

## 2017-01-07 DIAGNOSIS — T50992A Poisoning by other drugs, medicaments and biological substances, intentional self-harm, initial encounter: Secondary | ICD-10-CM | POA: Diagnosis not present

## 2017-01-07 DIAGNOSIS — G9340 Encephalopathy, unspecified: Secondary | ICD-10-CM | POA: Diagnosis present

## 2017-01-07 DIAGNOSIS — R000 Tachycardia, unspecified: Secondary | ICD-10-CM

## 2017-01-07 DIAGNOSIS — R569 Unspecified convulsions: Secondary | ICD-10-CM | POA: Diagnosis not present

## 2017-01-07 DIAGNOSIS — R9431 Abnormal electrocardiogram [ECG] [EKG]: Secondary | ICD-10-CM | POA: Diagnosis present

## 2017-01-07 DIAGNOSIS — T43592A Poisoning by other antipsychotics and neuroleptics, intentional self-harm, initial encounter: Principal | ICD-10-CM | POA: Diagnosis present

## 2017-01-07 DIAGNOSIS — F17210 Nicotine dependence, cigarettes, uncomplicated: Secondary | ICD-10-CM | POA: Diagnosis present

## 2017-01-07 DIAGNOSIS — G4089 Other seizures: Secondary | ICD-10-CM | POA: Diagnosis present

## 2017-01-07 DIAGNOSIS — R402441 Other coma, without documented Glasgow coma scale score, or with partial score reported, in the field [EMT or ambulance]: Secondary | ICD-10-CM | POA: Diagnosis not present

## 2017-01-07 DIAGNOSIS — E876 Hypokalemia: Secondary | ICD-10-CM | POA: Diagnosis present

## 2017-01-07 DIAGNOSIS — F332 Major depressive disorder, recurrent severe without psychotic features: Secondary | ICD-10-CM | POA: Diagnosis present

## 2017-01-07 DIAGNOSIS — T50902A Poisoning by unspecified drugs, medicaments and biological substances, intentional self-harm, initial encounter: Secondary | ICD-10-CM | POA: Diagnosis not present

## 2017-01-07 DIAGNOSIS — F10239 Alcohol dependence with withdrawal, unspecified: Secondary | ICD-10-CM | POA: Diagnosis present

## 2017-01-07 DIAGNOSIS — F1020 Alcohol dependence, uncomplicated: Secondary | ICD-10-CM | POA: Diagnosis present

## 2017-01-07 DIAGNOSIS — Y904 Blood alcohol level of 80-99 mg/100 ml: Secondary | ICD-10-CM | POA: Diagnosis present

## 2017-01-07 DIAGNOSIS — I4581 Long QT syndrome: Secondary | ICD-10-CM | POA: Diagnosis not present

## 2017-01-07 DIAGNOSIS — F1910 Other psychoactive substance abuse, uncomplicated: Secondary | ICD-10-CM | POA: Diagnosis present

## 2017-01-07 DIAGNOSIS — F4310 Post-traumatic stress disorder, unspecified: Secondary | ICD-10-CM | POA: Diagnosis present

## 2017-01-07 DIAGNOSIS — R45851 Suicidal ideations: Secondary | ICD-10-CM | POA: Diagnosis present

## 2017-01-07 DIAGNOSIS — E872 Acidosis: Secondary | ICD-10-CM | POA: Diagnosis present

## 2017-01-07 DIAGNOSIS — Z781 Physical restraint status: Secondary | ICD-10-CM

## 2017-01-07 DIAGNOSIS — E162 Hypoglycemia, unspecified: Secondary | ICD-10-CM | POA: Diagnosis not present

## 2017-01-07 DIAGNOSIS — B1920 Unspecified viral hepatitis C without hepatic coma: Secondary | ICD-10-CM | POA: Diagnosis present

## 2017-01-07 DIAGNOSIS — R509 Fever, unspecified: Secondary | ICD-10-CM | POA: Diagnosis not present

## 2017-01-07 DIAGNOSIS — Z9151 Personal history of suicidal behavior: Secondary | ICD-10-CM | POA: Diagnosis present

## 2017-01-07 DIAGNOSIS — Z79899 Other long term (current) drug therapy: Secondary | ICD-10-CM

## 2017-01-07 DIAGNOSIS — R918 Other nonspecific abnormal finding of lung field: Secondary | ICD-10-CM | POA: Diagnosis not present

## 2017-01-07 DIAGNOSIS — G312 Degeneration of nervous system due to alcohol: Secondary | ICD-10-CM | POA: Diagnosis present

## 2017-01-07 DIAGNOSIS — R001 Bradycardia, unspecified: Secondary | ICD-10-CM | POA: Diagnosis present

## 2017-01-07 DIAGNOSIS — R339 Retention of urine, unspecified: Secondary | ICD-10-CM | POA: Diagnosis present

## 2017-01-07 DIAGNOSIS — Z915 Personal history of self-harm: Secondary | ICD-10-CM | POA: Diagnosis present

## 2017-01-07 DIAGNOSIS — T50901A Poisoning by unspecified drugs, medicaments and biological substances, accidental (unintentional), initial encounter: Secondary | ICD-10-CM | POA: Diagnosis present

## 2017-01-07 DIAGNOSIS — E878 Other disorders of electrolyte and fluid balance, not elsewhere classified: Secondary | ICD-10-CM | POA: Diagnosis present

## 2017-01-07 DIAGNOSIS — G92 Toxic encephalopathy: Secondary | ICD-10-CM | POA: Diagnosis present

## 2017-01-07 DIAGNOSIS — T450X2A Poisoning by antiallergic and antiemetic drugs, intentional self-harm, initial encounter: Secondary | ICD-10-CM | POA: Diagnosis present

## 2017-01-07 HISTORY — DX: Alcohol abuse, uncomplicated: F10.10

## 2017-01-07 HISTORY — DX: Depression, unspecified: F32.A

## 2017-01-07 HISTORY — DX: Unspecified viral hepatitis C without hepatic coma: B19.20

## 2017-01-07 HISTORY — DX: Major depressive disorder, single episode, unspecified: F32.9

## 2017-01-07 LAB — RAPID URINE DRUG SCREEN, HOSP PERFORMED
Amphetamines: NOT DETECTED
BARBITURATES: NOT DETECTED
Benzodiazepines: NOT DETECTED
COCAINE: NOT DETECTED
Opiates: NOT DETECTED
Tetrahydrocannabinol: NOT DETECTED

## 2017-01-07 LAB — I-STAT CG4 LACTIC ACID, ED
LACTIC ACID, VENOUS: 12.05 mmol/L — AB (ref 0.5–1.9)
Lactic Acid, Venous: 6.31 mmol/L (ref 0.5–1.9)

## 2017-01-07 LAB — CBC WITH DIFFERENTIAL/PLATELET
BASOS ABS: 0.1 10*3/uL (ref 0.0–0.1)
Basophils Relative: 0 %
Eosinophils Absolute: 0 10*3/uL (ref 0.0–0.7)
Eosinophils Relative: 0 %
HCT: 45.3 % (ref 36.0–46.0)
Hemoglobin: 15.3 g/dL — ABNORMAL HIGH (ref 12.0–15.0)
LYMPHS ABS: 2.7 10*3/uL (ref 0.7–4.0)
Lymphocytes Relative: 22 %
MCH: 31.9 pg (ref 26.0–34.0)
MCHC: 33.8 g/dL (ref 30.0–36.0)
MCV: 94.6 fL (ref 78.0–100.0)
MONO ABS: 0.7 10*3/uL (ref 0.1–1.0)
Monocytes Relative: 6 %
Neutro Abs: 8.6 10*3/uL — ABNORMAL HIGH (ref 1.7–7.7)
Neutrophils Relative %: 72 %
PLATELETS: 256 10*3/uL (ref 150–400)
RBC: 4.79 MIL/uL (ref 3.87–5.11)
RDW: 14.3 % (ref 11.5–15.5)
WBC: 12 10*3/uL — ABNORMAL HIGH (ref 4.0–10.5)

## 2017-01-07 LAB — MAGNESIUM: MAGNESIUM: 1.8 mg/dL (ref 1.7–2.4)

## 2017-01-07 LAB — I-STAT VENOUS BLOOD GAS, ED
Acid-base deficit: 13 mmol/L — ABNORMAL HIGH (ref 0.0–2.0)
Acid-base deficit: 13 mmol/L — ABNORMAL HIGH (ref 0.0–2.0)
Bicarbonate: 12.5 mmol/L — ABNORMAL LOW (ref 20.0–28.0)
Bicarbonate: 15.7 mmol/L — ABNORMAL LOW (ref 20.0–28.0)
O2 Saturation: 76 %
O2 Saturation: 88 %
PCO2 VEN: 46.5 mmHg (ref 44.0–60.0)
PH VEN: 7.137 — AB (ref 7.250–7.430)
PH VEN: 7.274 (ref 7.250–7.430)
TCO2: 13 mmol/L — ABNORMAL LOW (ref 22–32)
TCO2: 17 mmol/L — ABNORMAL LOW (ref 22–32)
pCO2, Ven: 27.1 mmHg — ABNORMAL LOW (ref 44.0–60.0)
pO2, Ven: 53 mmHg — ABNORMAL HIGH (ref 32.0–45.0)
pO2, Ven: 61 mmHg — ABNORMAL HIGH (ref 32.0–45.0)

## 2017-01-07 LAB — COMPREHENSIVE METABOLIC PANEL
ALT: 22 U/L (ref 14–54)
ANION GAP: 17 — AB (ref 5–15)
AST: 35 U/L (ref 15–41)
Albumin: 3.8 g/dL (ref 3.5–5.0)
Alkaline Phosphatase: 76 U/L (ref 38–126)
BUN: 9 mg/dL (ref 6–20)
CHLORIDE: 113 mmol/L — AB (ref 101–111)
CO2: 13 mmol/L — ABNORMAL LOW (ref 22–32)
Calcium: 8.8 mg/dL — ABNORMAL LOW (ref 8.9–10.3)
Creatinine, Ser: 0.81 mg/dL (ref 0.44–1.00)
Glucose, Bld: 111 mg/dL — ABNORMAL HIGH (ref 65–99)
POTASSIUM: 3.3 mmol/L — AB (ref 3.5–5.1)
Sodium: 143 mmol/L (ref 135–145)
Total Bilirubin: 0.6 mg/dL (ref 0.3–1.2)
Total Protein: 7.2 g/dL (ref 6.5–8.1)

## 2017-01-07 LAB — I-STAT CHEM 8, ED
BUN: 10 mg/dL (ref 6–20)
BUN: 11 mg/dL (ref 6–20)
CALCIUM ION: 1.16 mmol/L (ref 1.15–1.40)
CALCIUM ION: 0.98 mmol/L — AB (ref 1.15–1.40)
CHLORIDE: 117 mmol/L — AB (ref 101–111)
Chloride: 112 mmol/L — ABNORMAL HIGH (ref 101–111)
Creatinine, Ser: 0.6 mg/dL (ref 0.44–1.00)
Creatinine, Ser: 0.6 mg/dL (ref 0.44–1.00)
GLUCOSE: 99 mg/dL (ref 65–99)
Glucose, Bld: 115 mg/dL — ABNORMAL HIGH (ref 65–99)
HCT: 39 % (ref 36.0–46.0)
HCT: 48 % — ABNORMAL HIGH (ref 36.0–46.0)
HEMOGLOBIN: 16.3 g/dL — AB (ref 12.0–15.0)
HEMOGLOBIN: 13.3 g/dL (ref 12.0–15.0)
POTASSIUM: 3.2 mmol/L — AB (ref 3.5–5.1)
Potassium: 4.2 mmol/L (ref 3.5–5.1)
SODIUM: 147 mmol/L — AB (ref 135–145)
SODIUM: 143 mmol/L (ref 135–145)
TCO2: 14 mmol/L — ABNORMAL LOW (ref 22–32)
TCO2: 16 mmol/L — ABNORMAL LOW (ref 22–32)

## 2017-01-07 LAB — ETHANOL: Alcohol, Ethyl (B): 88 mg/dL — ABNORMAL HIGH (ref <10)

## 2017-01-07 LAB — LIPASE, BLOOD: LIPASE: 18 U/L (ref 11–51)

## 2017-01-07 LAB — ACETAMINOPHEN LEVEL

## 2017-01-07 LAB — SALICYLATE LEVEL

## 2017-01-07 LAB — I-STAT BETA HCG BLOOD, ED (MC, WL, AP ONLY): I-stat hCG, quantitative: <5 m[IU]/mL (ref <5)

## 2017-01-07 MED ORDER — POTASSIUM CHLORIDE 10 MEQ/100ML IV SOLN
10.0000 meq | INTRAVENOUS | Status: AC
  Administered 2017-01-07 – 2017-01-08 (×2): 10 meq via INTRAVENOUS
  Filled 2017-01-07 (×2): qty 100

## 2017-01-07 MED ORDER — LORAZEPAM 2 MG/ML IJ SOLN
1.0000 mg | Freq: Once | INTRAMUSCULAR | Status: AC
  Administered 2017-01-07: 1 mg via INTRAVENOUS

## 2017-01-07 MED ORDER — LORAZEPAM 2 MG/ML IJ SOLN
INTRAMUSCULAR | Status: AC
  Filled 2017-01-07: qty 1

## 2017-01-07 MED ORDER — SODIUM CHLORIDE 0.9 % IV BOLUS (SEPSIS)
1000.0000 mL | Freq: Once | INTRAVENOUS | Status: AC
  Administered 2017-01-07: 1000 mL via INTRAVENOUS

## 2017-01-07 MED ORDER — MAGNESIUM SULFATE 2 GM/50ML IV SOLN
2.0000 g | Freq: Once | INTRAVENOUS | Status: AC
  Administered 2017-01-07: 2 g via INTRAVENOUS
  Filled 2017-01-07: qty 50

## 2017-01-07 MED ORDER — SODIUM CHLORIDE 0.9 % IV SOLN
Freq: Once | INTRAVENOUS | Status: AC
  Administered 2017-01-08: 01:00:00 via INTRAVENOUS

## 2017-01-07 NOTE — ED Notes (Signed)
Pt had a 15 second full body seizure. MD notified.

## 2017-01-07 NOTE — ED Notes (Signed)
Writer notified EDP of abnormal I-stat lactic result 

## 2017-01-07 NOTE — ED Provider Notes (Addendum)
[facility] EMERGENCY DEPARTMENT Provider Note   CSN: [id number] Arrival date & time: 01/07/17  2035     History   Chief Complaint Chief Complaint  Patient presents with  . Altered Mental Status    HPI Laura [name] is a 46 y.o. female.  HPI   46 year old female with a history of anxiety, depression, PTSD, suicide attempt, alcohol abuse, prolonged QTC, substance abuse, presents with concern for overdose. Patient was last seen normal at 9 PM last night. Today she detected someone telling them that she had taken pills.  EMS counted pills in her bottle from refill 10/15 and found it to be appropriate amount.  Family reported she has overdosed before. They also report she drank etoh 2 bottles.   Per notes, patient was started on cymbalta andy lyrica 9/25, stopped effexor.  In the past she had been on prozac, vistaril and xanax, tried buspar.   History is limited by acuity of condition, mental status changes. On arrival to the emergency department, patient is following some commands, however is not speaking, ill-appearing. Patient had a 15 second seizure while in the emergency department, afterwhich she is localizing to pain but not reliably following commands.  Past Medical History:  Diagnosis Date  . Anxiety   . Substance abuse [facility])     Patient Active Problem List   Diagnosis Date Noted  . PTSD (post-traumatic stress disorder) 08/12/2016  . Major depressive disorder, recurrent severe without psychotic features ([place]) 08/11/2016  . Alcohol intoxication in active alcoholic without complication ([place])   . Suicide attempt ([place])   . Alcohol use disorder, severe, dependence ([place]) 07/23/2016  . 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 Hypokalemia 07/23/2016  . Prolonged Q-T interval on ECG 07/23/2016  . MDD (major depressive disorder), recurrent severe, without psychosis ([place]) 07/21/2016  .  Osteoarthritis of spine with radiculopathy, cervical region 07/02/2016  . Substance abuse ([place]) 07/02/2016  . Lumbar spondylosis 07/02/2016    Past Surgical History:  Procedure Laterality Date  . BACK SURGERY    . KNEE SURGERY      OB History    No data available       Home Medications    Prior to Admission medications   Medication Sig Start Date End Date Taking? Authorizing Provider  pregabalin (LYRICA) 150 MG capsule Take 1 capsule (150 mg total) by mouth 2 (two) times daily. 12/11/16   Vivi Barrack, MD  QUEtiapine (SEROQUEL) 400 MG tablet Takes 1 tablet in the am, 1 at noon and 2 at bedtime. 12/31/16   Vivi Barrack, MD    Family History Family History  Problem Relation Age of Onset  . Family history unknown: Yes    Social History Social History  Substance Use Topics  . Smoking status: Current Every Day Smoker    Packs/day: 1.00  . Smokeless tobacco: Never Used  . Alcohol use Yes     Comment: 3-4 pints a day     Allergies   Patient has no known allergies.   Review of Systems Review of Systems  Unable to perform ROS: Mental status change     Physical Exam Updated Vital Signs BP 109/71   Pulse (!) 130   Temp 97.8 F (36.6 C) (Axillary)   Resp (!) 22   SpO2 99%   Physical Exam  Constitutional: She appears well-developed and well-nourished. She appears listless. No distress.  Skin dry, warm, not flushed  HENT:  Head: Normocephalic and atraumatic.  Eyes: Conjunctivae and EOM are normal.  Mydriasis   Neck: Normal range of motion.  Cardiovascular: Regular rhythm, normal heart sounds and intact distal pulses.  Tachycardia present.  Exam reveals no gallop and no friction rub.   No murmur heard. Pulmonary/Chest: Effort normal and breath sounds normal. Tachypnea noted. No respiratory distress. She has no wheezes. She has no rales.  Abdominal: Soft. She exhibits no distension. There is no tenderness. There is no guarding.  Musculoskeletal: She  exhibits no edema or tenderness.  Neurological: She appears listless. GCS eye subscore is 4. GCS verbal subscore is 1. GCS motor subscore is 5.  3+ reflexes bilat lower ext  Skin: Skin is warm and dry. No rash noted. She is not diaphoretic. No erythema.  Nursing note and vitals reviewed.    ED Treatments / Results  Labs (all labs ordered are listed, but only abnormal results are displayed) Labs Reviewed  CBC WITH DIFFERENTIAL/PLATELET - Abnormal; Notable for the following:       Result Value   WBC 12.0 (*)    Hemoglobin 15.3 (*)    Neutro Abs 8.6 (*)    All other components within normal limits  I-STAT VENOUS BLOOD GAS, ED - Abnormal; Notable for the following:    pH, Ven 7.137 (*)    pO2, Ven 53.0 (*)    Bicarbonate 15.7 (*)    TCO2 17 (*)    Acid-base deficit 13.0 (*)    All other components within normal limits  I-STAT CHEM 8, ED - Abnormal; Notable for the following:    Sodium 147 (*)    Potassium 3.2 (*)    Chloride 112 (*)    Glucose, Bld 115 (*)    TCO2 16 (*)    Hemoglobin 16.3 (*)    HCT 48.0 (*)    All other components within normal limits  I-STAT CG4 LACTIC ACID, ED - Abnormal; Notable for the following:    Lactic Acid, Venous 12.05 (*)    All other components within normal limits  RAPID URINE DRUG SCREEN, HOSP PERFORMED  COMPREHENSIVE METABOLIC PANEL  MAGNESIUM  LIPASE, BLOOD  ACETAMINOPHEN LEVEL  SALICYLATE LEVEL  ETHANOL  I-STAT BETA HCG BLOOD, ED ([facility], [facility], [facility] ONLY)    EKG  EKG Interpretation None       Radiology No results found.  Procedures Procedures (including critical care time)  Medications Ordered in ED Medications  magnesium sulfate IVPB 2 g 50 mL (2 g Intravenous New Bag/Given 01/07/17 2126)  potassium chloride 10 mEq in 100 mL IVPB (not administered)  LORazepam (ATIVAN) injection 1 mg (1 mg Intravenous Given 01/07/17 2105)  sodium chloride 0.9 % bolus 1,000 mL (1,000 mLs Intravenous New Bag/Given 01/07/17 2104)  sodium chloride  0.9 % bolus 1,000 mL (1,000 mLs Intravenous New Bag/Given 01/07/17 2123)  CRITICAL CARE:overdose Performed by: Tennis Must   Total critical care time: 30 minutes  Critical care time was exclusive of separately billable procedures and treating other patients.  Critical care was necessary to treat or prevent imminent or life-threatening deterioration.  Critical care was time spent personally by me on the following activities: development of treatment plan with patient and/or surrogate as well as nursing, discussions with consultants, evaluation of patient's response to treatment, examination of patient, obtaining history from patient or surrogate, ordering and performing treatments and interventions, ordering and review of laboratory studies, ordering and review of radiographic studies, pulse oximetry and re-evaluation of patient's condition.  Initial Impression / Assessment and Plan / ED Course  I have reviewed the triage vital signs and the nursing notes.  Pertinent labs & imaging results that were available during my care of the patient were reviewed by me and considered in my medical decision making (see chart for details).      46 year old female with a history of anxiety, depression, PTSD, suicide attempt, alcohol abuse, prolonged QTC, substance abuse, presents with concern for overdose.  Unclear ingestion, possible seroquel per history provided at time of EMS call, however EMS counted appropriate pill numbers from the bottle present.    Patient with seizure on arrival that stopped spontaneously. Given 1mg  ativan.  She is altered, sleepy, but protecting her airway, tachycardic with prolonged QTc, and mydriasis.  Discussed with poison control given some suspicion for possible anticholinergic apperance, possible benadryl overdose versus other mixed picture.  Seroquel can cause mydriasis/tachycardia/seizure but usually also causes hypotension which patient does not have. Suspect  mixed picture, mixed ingestion.  Overall doubt etoh withdrawal seizure given history that she also drank etoh today and in setting of stating she was going to take medications for overdose as well as patient without tremors or hypertension.  Doubt intracranial injury given hx of overdose, no sign of trauma.   Labs show elevated lactic acid and metabolic acidosis without appropriate respiratory compensation (slight resp component) which are both likely secondary to seizure. Will continue to hydrate and recheck.  Salicylate and tylenol levels pending.    Patient given ativan, Mg 2g, K, IV fluids. Salicylate, tylenol, negative.  Etoh 88, less than usual, she is at risk for withdrawal but I do not believe her initial presentation is consistent with this. Repeat blood gas and lactic acid with improvement 7.274 from 7.137, lactic acid to 6 from 12, and feel acidosis primarily secondary to seizure and improving.   Patient more alert, following commands, but still not speaking.  Ordered additional ativan given low etoh level and hx of abuse.     Consulted Dr. Hal Hope for admission who will evaluate her.  I feel she is appropriate for stepdown unit at this time.   Final Clinical Impressions(s) / ED Diagnoses   Final diagnoses:  Intentional drug overdose, initial encounter ([place])  Tachycardia  Prolonged Q-T interval on ECG  Seizure [facility])    New Prescriptions New Prescriptions   No medications on file        Gareth Morgan, MD 01/08/17 [id number]    Gareth Morgan, MD 01/08/17 0020

## 2017-01-07 NOTE — ED Triage Notes (Addendum)
Per EMS: Pt found in kitchen and crawled to living room. Pt texted a family member and said she had taken pills. EMS counted Seroquel and said she has been taking it as perscribed. Pt drank two bottles alcohol. Family states this has happened multiple times. Last seen normal was yesterday.   Pt unable to answer questions in triage. Pt moving extremities. Pt able to follow some commands. BP: 114/66 HR: 136 SPO2: 97% CBG: 136

## 2017-01-08 ENCOUNTER — Encounter (HOSPITAL_COMMUNITY): Payer: Self-pay | Admitting: Internal Medicine

## 2017-01-08 DIAGNOSIS — Y904 Blood alcohol level of 80-99 mg/100 ml: Secondary | ICD-10-CM | POA: Diagnosis present

## 2017-01-08 DIAGNOSIS — Z915 Personal history of self-harm: Secondary | ICD-10-CM | POA: Diagnosis not present

## 2017-01-08 DIAGNOSIS — G312 Degeneration of nervous system due to alcohol: Secondary | ICD-10-CM | POA: Diagnosis present

## 2017-01-08 DIAGNOSIS — R001 Bradycardia, unspecified: Secondary | ICD-10-CM | POA: Diagnosis present

## 2017-01-08 DIAGNOSIS — G4089 Other seizures: Secondary | ICD-10-CM | POA: Diagnosis present

## 2017-01-08 DIAGNOSIS — G9340 Encephalopathy, unspecified: Secondary | ICD-10-CM | POA: Diagnosis not present

## 2017-01-08 DIAGNOSIS — F1910 Other psychoactive substance abuse, uncomplicated: Secondary | ICD-10-CM

## 2017-01-08 DIAGNOSIS — F332 Major depressive disorder, recurrent severe without psychotic features: Secondary | ICD-10-CM | POA: Diagnosis present

## 2017-01-08 DIAGNOSIS — T50901A Poisoning by unspecified drugs, medicaments and biological substances, accidental (unintentional), initial encounter: Secondary | ICD-10-CM | POA: Diagnosis present

## 2017-01-08 DIAGNOSIS — Z818 Family history of other mental and behavioral disorders: Secondary | ICD-10-CM | POA: Diagnosis not present

## 2017-01-08 DIAGNOSIS — E872 Acidosis: Secondary | ICD-10-CM | POA: Diagnosis present

## 2017-01-08 DIAGNOSIS — T50902A Poisoning by unspecified drugs, medicaments and biological substances, intentional self-harm, initial encounter: Secondary | ICD-10-CM | POA: Diagnosis not present

## 2017-01-08 DIAGNOSIS — F1020 Alcohol dependence, uncomplicated: Secondary | ICD-10-CM

## 2017-01-08 DIAGNOSIS — F419 Anxiety disorder, unspecified: Secondary | ICD-10-CM | POA: Diagnosis not present

## 2017-01-08 DIAGNOSIS — F17210 Nicotine dependence, cigarettes, uncomplicated: Secondary | ICD-10-CM | POA: Diagnosis present

## 2017-01-08 DIAGNOSIS — G92 Toxic encephalopathy: Secondary | ICD-10-CM | POA: Diagnosis present

## 2017-01-08 DIAGNOSIS — T450X2A Poisoning by antiallergic and antiemetic drugs, intentional self-harm, initial encounter: Secondary | ICD-10-CM | POA: Diagnosis not present

## 2017-01-08 DIAGNOSIS — T510X2A Toxic effect of ethanol, intentional self-harm, initial encounter: Secondary | ICD-10-CM | POA: Diagnosis not present

## 2017-01-08 DIAGNOSIS — Z8619 Personal history of other infectious and parasitic diseases: Secondary | ICD-10-CM | POA: Diagnosis not present

## 2017-01-08 DIAGNOSIS — T43592A Poisoning by other antipsychotics and neuroleptics, intentional self-harm, initial encounter: Secondary | ICD-10-CM | POA: Diagnosis not present

## 2017-01-08 DIAGNOSIS — R569 Unspecified convulsions: Secondary | ICD-10-CM | POA: Diagnosis present

## 2017-01-08 DIAGNOSIS — T1491XA Suicide attempt, initial encounter: Secondary | ICD-10-CM | POA: Diagnosis not present

## 2017-01-08 DIAGNOSIS — F10239 Alcohol dependence with withdrawal, unspecified: Secondary | ICD-10-CM | POA: Diagnosis present

## 2017-01-08 DIAGNOSIS — Z8614 Personal history of Methicillin resistant Staphylococcus aureus infection: Secondary | ICD-10-CM | POA: Diagnosis not present

## 2017-01-08 DIAGNOSIS — E162 Hypoglycemia, unspecified: Secondary | ICD-10-CM | POA: Diagnosis not present

## 2017-01-08 DIAGNOSIS — F339 Major depressive disorder, recurrent, unspecified: Secondary | ICD-10-CM | POA: Diagnosis not present

## 2017-01-08 DIAGNOSIS — B1920 Unspecified viral hepatitis C without hepatic coma: Secondary | ICD-10-CM | POA: Diagnosis present

## 2017-01-08 DIAGNOSIS — F1010 Alcohol abuse, uncomplicated: Secondary | ICD-10-CM | POA: Diagnosis present

## 2017-01-08 DIAGNOSIS — R45851 Suicidal ideations: Secondary | ICD-10-CM | POA: Diagnosis present

## 2017-01-08 DIAGNOSIS — F4310 Post-traumatic stress disorder, unspecified: Secondary | ICD-10-CM | POA: Diagnosis present

## 2017-01-08 DIAGNOSIS — F10231 Alcohol dependence with withdrawal delirium: Secondary | ICD-10-CM | POA: Diagnosis not present

## 2017-01-08 DIAGNOSIS — I4581 Long QT syndrome: Secondary | ICD-10-CM | POA: Diagnosis not present

## 2017-01-08 DIAGNOSIS — M545 Low back pain: Secondary | ICD-10-CM | POA: Diagnosis present

## 2017-01-08 DIAGNOSIS — R339 Retention of urine, unspecified: Secondary | ICD-10-CM | POA: Diagnosis present

## 2017-01-08 DIAGNOSIS — Z79899 Other long term (current) drug therapy: Secondary | ICD-10-CM | POA: Diagnosis not present

## 2017-01-08 DIAGNOSIS — Z781 Physical restraint status: Secondary | ICD-10-CM | POA: Diagnosis not present

## 2017-01-08 DIAGNOSIS — G8929 Other chronic pain: Secondary | ICD-10-CM | POA: Diagnosis present

## 2017-01-08 DIAGNOSIS — E876 Hypokalemia: Secondary | ICD-10-CM | POA: Diagnosis present

## 2017-01-08 DIAGNOSIS — R450 Nervousness: Secondary | ICD-10-CM | POA: Diagnosis not present

## 2017-01-08 LAB — BASIC METABOLIC PANEL
ANION GAP: 7 (ref 5–15)
Anion gap: 9 (ref 5–15)
BUN: 9 mg/dL (ref 6–20)
BUN: 6 mg/dL (ref 6–20)
CALCIUM: 8.2 mg/dL — AB (ref 8.9–10.3)
CHLORIDE: 112 mmol/L — AB (ref 101–111)
CO2: 19 mmol/L — ABNORMAL LOW (ref 22–32)
CO2: 18 mmol/L — ABNORMAL LOW (ref 22–32)
CREATININE: 0.78 mg/dL (ref 0.44–1.00)
Calcium: 8.3 mg/dL — ABNORMAL LOW (ref 8.9–10.3)
Chloride: 113 mmol/L — ABNORMAL HIGH (ref 101–111)
Creatinine, Ser: 0.68 mg/dL (ref 0.44–1.00)
GFR calc Af Amer: >60 mL/min (ref >60)
GFR calc non Af Amer: >60 mL/min (ref >60)
GFR calc non Af Amer: >60 mL/min (ref >60)
GLUCOSE: 92 mg/dL (ref 65–99)
Glucose, Bld: 84 mg/dL (ref 65–99)
POTASSIUM: 3.4 mmol/L — AB (ref 3.5–5.1)
Potassium: 3.9 mmol/L (ref 3.5–5.1)
SODIUM: 141 mmol/L (ref 135–145)
Sodium: 137 mmol/L (ref 135–145)

## 2017-01-08 LAB — CBG MONITORING, ED
GLUCOSE-CAPILLARY: 66 mg/dL (ref 65–99)
Glucose-Capillary: 87 mg/dL (ref 65–99)
Glucose-Capillary: 91 mg/dL (ref 65–99)
Glucose-Capillary: 104 mg/dL — ABNORMAL HIGH (ref 65–99)

## 2017-01-08 LAB — GLUCOSE, CAPILLARY
GLUCOSE-CAPILLARY: 85 mg/dL (ref 65–99)
Glucose-Capillary: 80 mg/dL (ref 65–99)

## 2017-01-08 LAB — LACTIC ACID, PLASMA
Lactic Acid, Venous: 3.3 mmol/L (ref 0.5–1.9)
Lactic Acid, Venous: 3.1 mmol/L (ref 0.5–1.9)
Lactic Acid, Venous: 1.1 mmol/L (ref 0.5–1.9)

## 2017-01-08 LAB — CK
CK TOTAL: 119 U/L (ref 38–234)
Total CK: 364 U/L — ABNORMAL HIGH (ref 38–234)

## 2017-01-08 LAB — HEPATIC FUNCTION PANEL
ALK PHOS: 66 U/L (ref 38–126)
ALT: 20 U/L (ref 14–54)
AST: 28 U/L (ref 15–41)
Albumin: 3.3 g/dL — ABNORMAL LOW (ref 3.5–5.0)
BILIRUBIN DIRECT: 0.2 mg/dL (ref 0.1–0.5)
Indirect Bilirubin: 0.6 mg/dL (ref 0.3–0.9)
Total Bilirubin: 0.8 mg/dL (ref 0.3–1.2)
Total Protein: 6.2 g/dL — ABNORMAL LOW (ref 6.5–8.1)

## 2017-01-08 LAB — CBC
HCT: 37 % (ref 36.0–46.0)
Hemoglobin: 12.2 g/dL (ref 12.0–15.0)
MCH: 30.7 pg (ref 26.0–34.0)
MCHC: 33 g/dL (ref 30.0–36.0)
MCV: 93.2 fL (ref 78.0–100.0)
Platelets: 246 10*3/uL (ref 150–400)
RBC: 3.97 MIL/uL (ref 3.87–5.11)
RDW: 14.2 % (ref 11.5–15.5)
WBC: 11.5 10*3/uL — AB (ref 4.0–10.5)

## 2017-01-08 LAB — TROPONIN I

## 2017-01-08 LAB — MAGNESIUM: Magnesium: 2.2 mg/dL (ref 1.7–2.4)

## 2017-01-08 LAB — MRSA PCR SCREENING: MRSA BY PCR: NEGATIVE

## 2017-01-08 MED ORDER — DEXTROSE-NACL 5-0.45 % IV SOLN
INTRAVENOUS | Status: DC
  Administered 2017-01-08 – 2017-01-12 (×8): via INTRAVENOUS

## 2017-01-08 MED ORDER — LORAZEPAM 2 MG/ML IJ SOLN
1.0000 mg | Freq: Once | INTRAMUSCULAR | Status: AC
  Administered 2017-01-08: 1 mg via INTRAVENOUS

## 2017-01-08 MED ORDER — CHLORHEXIDINE GLUCONATE 0.12 % MT SOLN
15.0000 mL | Freq: Two times a day (BID) | OROMUCOSAL | Status: DC
  Administered 2017-01-09 – 2017-01-13 (×6): 15 mL via OROMUCOSAL
  Filled 2017-01-08 (×2): qty 15

## 2017-01-08 MED ORDER — ADULT MULTIVITAMIN W/MINERALS CH
1.0000 | ORAL_TABLET | Freq: Every day | ORAL | Status: DC
  Administered 2017-01-12 – 2017-01-14 (×3): 1 via ORAL
  Filled 2017-01-08 (×6): qty 1

## 2017-01-08 MED ORDER — LORAZEPAM 2 MG/ML IJ SOLN
1.0000 mg | Freq: Four times a day (QID) | INTRAMUSCULAR | Status: DC | PRN
  Administered 2017-01-08 (×2): 1 mg via INTRAVENOUS
  Filled 2017-01-08 (×2): qty 1

## 2017-01-08 MED ORDER — FOLIC ACID 1 MG PO TABS
1.0000 mg | ORAL_TABLET | Freq: Every day | ORAL | Status: DC
  Administered 2017-01-09: 1 mg via ORAL
  Filled 2017-01-08: qty 1

## 2017-01-08 MED ORDER — HALOPERIDOL LACTATE 5 MG/ML IJ SOLN: 5.0000 mg | Freq: Once | INTRAMUSCULAR | Status: DC | PRN

## 2017-01-08 MED ORDER — LORAZEPAM 2 MG/ML IJ SOLN
1.0000 mg | INTRAMUSCULAR | Status: DC | PRN
  Administered 2017-01-08 – 2017-01-09 (×6): 1 mg via INTRAVENOUS
  Filled 2017-01-08 (×5): qty 1

## 2017-01-08 MED ORDER — ORAL CARE MOUTH RINSE
15.0000 mL | Freq: Two times a day (BID) | OROMUCOSAL | Status: DC
  Administered 2017-01-09 – 2017-01-12 (×7): 15 mL via OROMUCOSAL

## 2017-01-08 MED ORDER — SODIUM CHLORIDE 0.45 % IV SOLN
INTRAVENOUS | Status: DC
  Administered 2017-01-08: 09:00:00 via INTRAVENOUS

## 2017-01-08 MED ORDER — LORAZEPAM 1 MG PO TABS: 1.0000 mg | ORAL_TABLET | Freq: Four times a day (QID) | ORAL | Status: DC | PRN

## 2017-01-08 MED ORDER — LORAZEPAM 2 MG/ML IJ SOLN
2.0000 mg | INTRAMUSCULAR | Status: DC | PRN
  Administered 2017-01-08 (×2): 3 mg via INTRAVENOUS
  Administered 2017-01-09 (×3): 2 mg via INTRAVENOUS
  Administered 2017-01-09: 3 mg via INTRAVENOUS
  Administered 2017-01-09: 2 mg via INTRAVENOUS
  Administered 2017-01-09: 3 mg via INTRAVENOUS
  Administered 2017-01-09 (×3): 2 mg via INTRAVENOUS
  Administered 2017-01-09: 3 mg via INTRAVENOUS
  Administered 2017-01-09: 2 mg via INTRAVENOUS
  Administered 2017-01-09: 3 mg via INTRAVENOUS
  Filled 2017-01-08: qty 1
  Filled 2017-01-08: qty 2
  Filled 2017-01-08 (×4): qty 1
  Filled 2017-01-08 (×2): qty 2
  Filled 2017-01-08: qty 1
  Filled 2017-01-08: qty 2
  Filled 2017-01-08 (×2): qty 1
  Filled 2017-01-08 (×2): qty 2
  Filled 2017-01-08 (×3): qty 1

## 2017-01-08 MED ORDER — VITAMIN B-1 100 MG PO TABS
100.0000 mg | ORAL_TABLET | Freq: Every day | ORAL | Status: DC
  Administered 2017-01-12 – 2017-01-14 (×3): 100 mg via ORAL
  Filled 2017-01-08 (×6): qty 1

## 2017-01-08 MED ORDER — THIAMINE HCL 100 MG/ML IJ SOLN
100.0000 mg | Freq: Every day | INTRAMUSCULAR | Status: DC
  Administered 2017-01-08: 100 mg via INTRAVENOUS
  Filled 2017-01-08: qty 2

## 2017-01-08 MED ORDER — SODIUM CHLORIDE 0.9 % IV BOLUS (SEPSIS)
1000.0000 mL | Freq: Once | INTRAVENOUS | Status: AC
  Administered 2017-01-08: 1000 mL via INTRAVENOUS

## 2017-01-08 MED ORDER — THIAMINE HCL 100 MG/ML IJ SOLN
100.0000 mg | Freq: Every day | INTRAMUSCULAR | Status: DC
  Administered 2017-01-08 – 2017-01-11 (×4): 100 mg via INTRAVENOUS
  Filled 2017-01-08: qty 1
  Filled 2017-01-08 (×2): qty 2
  Filled 2017-01-08: qty 1

## 2017-01-08 MED ORDER — ENOXAPARIN SODIUM 40 MG/0.4ML ~~LOC~~ SOLN
40.0000 mg | SUBCUTANEOUS | Status: DC
  Administered 2017-01-09 – 2017-01-13 (×5): 40 mg via SUBCUTANEOUS
  Filled 2017-01-08 (×6): qty 0.4

## 2017-01-08 MED ORDER — LORAZEPAM 2 MG/ML IJ SOLN
2.0000 mg | Freq: Once | INTRAMUSCULAR | Status: AC
  Administered 2017-01-08: 2 mg via INTRAVENOUS
  Filled 2017-01-08: qty 1

## 2017-01-08 MED ORDER — DEXTROSE 50 % IV SOLN
1.0000 | Freq: Once | INTRAVENOUS | Status: AC
  Administered 2017-01-08: 50 mL via INTRAVENOUS
  Filled 2017-01-08: qty 50

## 2017-01-08 NOTE — Progress Notes (Signed)
Patient agitated upon arrival to unit. Patient restless, constantly moving, and appears to be responding to internal stimuli. Patient grabbing and hitting at the air. Patient also looking around quickly and seems to be paranoid. CIWA elevated and medication given. Patient did not answer any questions but when you say her name she will make eye contact. Patient tried to speak and speech is slurred and not making sense. Suicide sitter at the bedside for safety. Will continue to monitor and support.

## 2017-01-08 NOTE — Telephone Encounter (Signed)
Pt is currently in the hospital. Will discuss further appointments at hospital follow up.

## 2017-01-08 NOTE — ED Notes (Signed)
Admitting at bedside 

## 2017-01-08 NOTE — ED Notes (Signed)
Pt quetiapine counted and sent to pharmacy

## 2017-01-08 NOTE — ED Notes (Signed)
Pt yelled out and said, "turn the lights off." First time pt has spoken since being in emergency department. Nurse went to assess pt and pt would still not talk at this time. Pt able to follow some direction

## 2017-01-08 NOTE — ED Notes (Signed)
Pt starting to lift herself up in bed. Pt seems to be getting more agitated and constantly looks around the room. Pt will not talk at this time.

## 2017-01-08 NOTE — ED Notes (Signed)
MD paged about lactic

## 2017-01-08 NOTE — Progress Notes (Signed)
PROGRESS NOTE    Laura [name]  WUJ:[id number] DOB: 1970/08/02 DOA: 01/07/2017 PCP: Vivi Barrack, MD    Brief Narrative:  Laura [name] is a 46 y.o. female with history of depression and substance abuse and alcohol abuse was brought to the ER after patient had intentionally overdosed with drugs.  Patient prior to taking the drugs had called her friends that she is about to take the medications with suicidal ideation.  Patient's parents came to check on the patient and was found on the floor and EMS was called.  ED Course: In the ER patient is found to be tachycardic confused and had myoclonic jerks.  Patient briefly had an episode of tonic-clonic seizure which lasted for 15 seconds and was given Ativan 1 mg IV.  The patient was started on fluids.  EKG was showing sinus tachycardia with prolonged QTC of 596 ms.  Patient was given 2 g of IV magnesium sulfate.  Poison control was notified.  The likely medications as per the ER physician who provided most of the history, patient put out taken, is Seroquel and Benadryl.  Alcohol levels are positive.  Acetaminophen level and salicylates levels were negative.   Assessment & Plan:   Principal Problem:   Acute encephalopathy Active Problems:   Substance abuse (HCC)   MDD (major depressive disorder), recurrent severe, without psychosis ([employer])   Alcohol use disorder, severe, dependence ([place])   Drug overdose   Intentional OD: presumed Seroquel and Benadryl. ETOH Salicylate and tylenol levels normal. Initial Lactic acid >12 w/ rapid correction w/ IVF. UDS negative. ETOH mildly elevated at 88. Poison control contacted by admitting physician who recommended close monitoring, tele/EKG and bicarb bolus of 1-2mg /kg if QRS widens.  - Suicide precatuions w/ sitter in room - psych eval when medically stable/awake - IVF - Trend lactic acid - Ativan PRN  Seizure: likely secondary to OD attempt. No further episodes after single dose of Ativan. No  h/o the same.  - Seizure precautions - Ativan PRN - Neuro consult if continues after OD resolves.   Hypoglycemia: glucose continues to drop during admission. Mild. Likely from ongoing metabolic acidosis/requirements from tremor seizure and agitation. - D5 1/2NS  ETOH use:  - CIWA - Thiamine and folate replacement.    DVT prophylaxis: lovenox Code Status: full Family Communication: none Disposition Plan: pending improvement and psych eval   Consultants:   none  Procedures:   none  Antimicrobials:   none    Subjective: Pt writhing around in bed w/ a wide eyed stare. Completely disoriented.   Objective: Vitals:   01/08/17 1130 01/08/17 1145 01/08/17 1200 01/08/17 1305  BP: (!) 104/94   (!) 130/91  Pulse: (!) 107 (!) 102 (!) 105 (!) 107  Resp: 18 (!) 32 19 (!) 24  Temp:      TempSrc:      SpO2: 99% 99% 100% 100%    Intake/Output Summary (Last 24 hours) at 01/08/17 1430 Last data filed at 01/08/17 1142  Gross per 24 hour  Intake           2202.5 ml  Output                0 ml  Net           2202.5 ml   There were no vitals filed for this visit.  Examination:  General exam: Appears anxious, resting intermittently in bed and then writhing around Respiratory system: Clear to auscultation. Respiratory effort normal. Cardiovascular  system: RRR, tachycardia, no MRG Gastrointestinal system: Abdomen is nondistended, soft and nontender. . Central nervous system: Agitated. Moves all extremities in coordinated fashion, no focal deficits. Extremities: Symmetric 5 x 5 power. Skin: No rashes, lesions or ulcers Psychiatry: AOx0.confused.      Data Reviewed: I have personally reviewed following labs and imaging studies  CBC:  Recent Labs Lab 01/07/17 2107 01/07/17 2114 01/07/17 2333 01/08/17 0335  WBC 12.0*  --   --  11.5*  NEUTROABS 8.6*  --   --   --   HGB 15.3* 16.3* 13.3 12.2  HCT 45.3 48.0* 39.0 37.0  MCV 94.6  --   --  93.2  PLT 256  --   --  027    Basic Metabolic Panel:  Recent Labs Lab 01/07/17 2107 01/07/17 2114 01/07/17 2333 01/08/17 0335  NA 143 147* 143 141  K 3.3* 3.2* 4.2 3.9  CL 113* 112* 117* 113*  CO2 13*  --   --  19*  GLUCOSE 111* 115* 99 84  BUN 9 11 10 9   CREATININE 0.81 0.60 0.60 0.78  CALCIUM 8.8*  --   --  8.3*  MG 1.8  --   --  2.2   GFR: CrCl cannot be calculated (Unknown ideal weight.). Liver Function Tests:  Recent Labs Lab 01/07/17 2107 01/08/17 0335  AST 35 28  ALT 22 20  ALKPHOS 76 66  BILITOT 0.6 0.8  PROT 7.2 6.2*  ALBUMIN 3.8 3.3*    Recent Labs Lab 01/07/17 2107  LIPASE 18   No results for input(s): AMMONIA in the last 168 hours. Coagulation Profile: No results for input(s): INR, PROTIME in the last 168 hours. Cardiac Enzymes:  Recent Labs Lab 01/08/17 0124  CKTOTAL 119  TROPONINI <0.03   BNP (last 3 results) No results for input(s): PROBNP in the last 8760 hours. HbA1C: No results for input(s): HGBA1C in the last 72 hours. CBG:  Recent Labs Lab 01/08/17 0736 01/08/17 0829 01/08/17 1135  GLUCAP 87 91 66   Lipid Profile: No results for input(s): CHOL, HDL, LDLCALC, TRIG, CHOLHDL, LDLDIRECT in the last 72 hours. Thyroid Function Tests: No results for input(s): TSH, T4TOTAL, FREET4, T3FREE, THYROIDAB in the last 72 hours. Anemia Panel: No results for input(s): VITAMINB12, FOLATE, FERRITIN, TIBC, IRON, RETICCTPCT in the last 72 hours. Sepsis Labs:  Recent Labs Lab 01/07/17 2123 01/07/17 2333 01/08/17 0124 01/08/17 0335  LATICACIDVEN 12.05* 6.31* 3.3* 3.1*    No results found for this or any previous visit (from the past 240 hour(s)).       Radiology Studies: Dg Chest Portable 1 View  Result Date: 01/07/2017 CLINICAL DATA:  46 year old female with overdose. EXAM: PORTABLE CHEST 1 VIEW COMPARISON:  None. FINDINGS: The heart size and mediastinal contours are within normal limits. Both lungs are clear. The visualized skeletal structures are  unremarkable. IMPRESSION: No active disease. Electronically Signed   By: Anner Crete M.D.   On: 01/07/2017 22:49        Scheduled Meds: . folic acid  1 mg Oral Daily  . multivitamin with minerals  1 tablet Oral Daily  . thiamine  100 mg Oral Daily   Or  . thiamine  100 mg Intravenous Daily   Continuous Infusions: . dextrose 5 % and 0.45% NaCl 100 mL/hr at 01/08/17 1144     LOS: 0 days    Time spent: >35 min    Waldemar Dickens, MD [facility]   If 7PM-7AM, please contact  night-coverage [email or web address] Password [email or web address] 01/08/2017, 2:30 PM

## 2017-01-08 NOTE — ED Notes (Signed)
Sitter at bedside.

## 2017-01-08 NOTE — H&P (Addendum)
History and Physical    Laura [name] ACZ:[id number] DOB: 1970/08/03 DOA: 01/07/2017  PCP: Vivi Barrack, MD  Patient coming from: Home.  Chief Complaint: Drug overdose.  HPI: Laura [name] is a 46 y.o. female with history of depression and substance abuse and alcohol abuse was brought to the ER after patient had intentionally overdosed with drugs.  Patient prior to taking the drugs had called her friends that she is about to take the medications with suicidal ideation.  Patient's parents came to check on the patient and was found on the floor and EMS was called.  ED Course: In the ER patient is found to be tachycardic confused and had myoclonic jerks.  Patient briefly had an episode of tonic-clonic seizure which lasted for 15 seconds and was given Ativan 1 mg IV.  The patient was started on fluids.  EKG was showing sinus tachycardia with prolonged QTC of 596 ms.  Patient was given 2 g of IV magnesium sulfate.  Poison control was notified.  The likely medications as per the ER physician who provided most of the history, patient put out taken, is Seroquel and Benadryl.  Alcohol levels are positive.  Acetaminophen level and salicylates levels were negative.  Review of Systems: As per HPI, rest all negative.   Past Medical History:  Diagnosis Date  . Anxiety   . Substance abuse [facility])     Past Surgical History:  Procedure Laterality Date  . BACK SURGERY    . KNEE SURGERY       reports that she has been smoking.  She has been smoking about 1.00 pack per day. She has never used smokeless tobacco. She reports that she drinks alcohol. She reports that she does not use drugs.  No Known Allergies  Family History  Problem Relation Age of Onset  . Family history unknown: Yes    Prior to Admission medications   Medication Sig Start Date End Date Taking? Authorizing Provider  pregabalin (LYRICA) 150 MG capsule Take 1 capsule (150 mg total) by mouth 2 (two) times daily. 12/11/16    Vivi Barrack, MD  QUEtiapine (SEROQUEL) 400 MG tablet Takes 1 tablet in the am, 1 at noon and 2 at bedtime. 12/31/16   Vivi Barrack, MD    Physical Exam: Vitals:   01/07/17 2230 01/07/17 2300 01/07/17 2330 01/08/17 0000  BP: 116/75 131/79 124/82 127/79  Pulse: (!) 125 (!) 126 (!) 131 (!) 128  Resp: (!) 22 (!) 35 18 (!) 22  Temp:      TempSrc:      SpO2: 100% 97% 100% 100%      Constitutional: Moderately built and nourished. Vitals:   01/07/17 2230 01/07/17 2300 01/07/17 2330 01/08/17 0000  BP: 116/75 131/79 124/82 127/79  Pulse: (!) 125 (!) 126 (!) 131 (!) 128  Resp: (!) 22 (!) 35 18 (!) 22  Temp:      TempSrc:      SpO2: 100% 97% 100% 100%   Eyes: Anicteric no pallor. ENMT: No discharge from the ears eyes nose or mouth. Neck: No mass felt.  No neck rigidity. Respiratory: No rhonchi or crepitations. Cardiovascular: S1-S2 heard no murmurs appreciated. Abdomen: Soft nontender bowel sounds present. Musculoskeletal: No edema.  No joint effusion. Skin: No rash.  Skin appears warm. Neurologic: Patient is confused and has some myoclonic jerks.  Does not follow commands.  Pupils are reacting to light. Psychiatric: Patient is confused.   Labs on Admission: I have  personally reviewed following labs and imaging studies  CBC:  Recent Labs Lab 01/07/17 2107 01/07/17 2114 01/07/17 2333  WBC 12.0*  --   --   NEUTROABS 8.6*  --   --   HGB 15.3* 16.3* 13.3  HCT 45.3 48.0* 39.0  MCV 94.6  --   --   PLT 256  --   --    Basic Metabolic Panel:  Recent Labs Lab 01/07/17 2107 01/07/17 2114 01/07/17 2333  NA 143 147* 143  K 3.3* 3.2* 4.2  CL 113* 112* 117*  CO2 13*  --   --   GLUCOSE 111* 115* 99  BUN 9 11 10   CREATININE 0.81 0.60 0.60  CALCIUM 8.8*  --   --   MG 1.8  --   --    GFR: CrCl cannot be calculated (Unknown ideal weight.). Liver Function Tests:  Recent Labs Lab 01/07/17 2107  AST 35  ALT 22  ALKPHOS 76  BILITOT 0.6  PROT 7.2  ALBUMIN 3.8     Recent Labs Lab 01/07/17 2107  LIPASE 18   No results for input(s): AMMONIA in the last 168 hours. Coagulation Profile: No results for input(s): INR, PROTIME in the last 168 hours. Cardiac Enzymes: No results for input(s): CKTOTAL, CKMB, CKMBINDEX, TROPONINI in the last 168 hours. BNP (last 3 results) No results for input(s): PROBNP in the last 8760 hours. HbA1C: No results for input(s): HGBA1C in the last 72 hours. CBG: No results for input(s): GLUCAP in the last 168 hours. Lipid Profile: No results for input(s): CHOL, HDL, LDLCALC, TRIG, CHOLHDL, LDLDIRECT in the last 72 hours. Thyroid Function Tests: No results for input(s): TSH, T4TOTAL, FREET4, T3FREE, THYROIDAB in the last 72 hours. Anemia Panel: No results for input(s): VITAMINB12, FOLATE, FERRITIN, TIBC, IRON, RETICCTPCT in the last 72 hours. Urine analysis:    Component Value Date/Time   BILIRUBINUR Negative 12/17/2016 1348   PROTEINUR Negative 12/17/2016 1348   UROBILINOGEN 0.2 12/17/2016 1348   NITRITE Negative 12/17/2016 1348   LEUKOCYTESUR Negative 12/17/2016 1348   Sepsis Labs: @LABRCNTIP (procalcitonin:4,lacticidven:4) )No results found for this or any previous visit (from the past 240 hour(s)).   Radiological Exams on Admission: Dg Chest Portable 1 View  Result Date: 01/07/2017 CLINICAL DATA:  46 year old female with overdose. EXAM: PORTABLE CHEST 1 VIEW COMPARISON:  None. FINDINGS: The heart size and mediastinal contours are within normal limits. Both lungs are clear. The visualized skeletal structures are unremarkable. IMPRESSION: No active disease. Electronically Signed   By: Anner Crete M.D.   On: 01/07/2017 22:49    EKG: Independently reviewed.  Initial EKG showed sinus tachycardia with QTC of 596 ms.  Repeat EKG after magnesium sulfate is showing a QTC of 473 ms.  QRS is less than 100.  Assessment/Plan Principal Problem:   Acute encephalopathy Active Problems:   Substance abuse (HCC)    MDD (major depressive disorder), recurrent severe, without psychosis ([place])   Alcohol use disorder, severe, dependence ([place])   Drug overdose    1. Intentional drug overdose with suicidal ideation likely tracks taken on Seroquel and Benadryl -I have discussed with poison control.  As per the poison control to check EKG and if QTC is prolonged then needs to check and correct potassium and magnesium.  If QRS is more than 120 ms and have to give bicarbonate boluses 1-2 mg/kg body weight until QRS narrows.  At this time patient's QTC and QRS has improved.  A repeat EKG again in the morning.  For any agitation or seizures PRN Ativan.  Continue with hydration. 2. Brief episode of seizures -likely precipitated by the drug overdose.  See #1. 3. History of substance abuse and alcohol abuse -I have placed patient on as needed Ativan and IV thiamine.  The patient may need to be on CIWA protocol once more alert and awake. 4. Suicidal ideation -suicide precautions for now and once patient is alert awake get psychiatric consult.  Reviewed old charts and labs.   DVT prophylaxis: SCDs. Code Status: Full code. Family Communication: No family at the bedside. Disposition Plan: To be determined. Consults called: None. Admission status: Inpatient.   Rise Patience MD [facility] Pager [phone].  If 7PM-7AM, please contact night-coverage [email or web address] Password [email or web address]  01/08/2017, 12:45 AM

## 2017-01-08 NOTE — Progress Notes (Signed)
Pt agitated, restless, combative, continuously hallucinating , unable to hold conversation with staff but will briefly make eye contact when name is called. Pt looking around and staring wide eyed at ceiling, constantly moving around in bed. CIWA score 39.Rapid RN called and advised RN to page MD for Stepdown protocol CIWA withdrawal order set. MD paged. Verbal order received with read back to order Stepdown CIWA protocol order set. Will continue to monitor.

## 2017-01-09 DIAGNOSIS — F10231 Alcohol dependence with withdrawal delirium: Secondary | ICD-10-CM

## 2017-01-09 DIAGNOSIS — R339 Retention of urine, unspecified: Secondary | ICD-10-CM

## 2017-01-09 LAB — HEPATIC FUNCTION PANEL
ALBUMIN: 3.8 g/dL (ref 3.5–5.0)
ALT: 24 U/L (ref 14–54)
AST: 46 U/L — ABNORMAL HIGH (ref 15–41)
Alkaline Phosphatase: 82 U/L (ref 38–126)
Bilirubin, Direct: 0.4 mg/dL (ref 0.1–0.5)
Indirect Bilirubin: 1.3 mg/dL — ABNORMAL HIGH (ref 0.3–0.9)
TOTAL PROTEIN: 7 g/dL (ref 6.5–8.1)
Total Bilirubin: 1.7 mg/dL — ABNORMAL HIGH (ref 0.3–1.2)

## 2017-01-09 LAB — BASIC METABOLIC PANEL
Anion gap: 7 (ref 5–15)
BUN: <5 mg/dL — ABNORMAL LOW (ref 6–20)
CHLORIDE: 110 mmol/L (ref 101–111)
CO2: 21 mmol/L — ABNORMAL LOW (ref 22–32)
CREATININE: 0.54 mg/dL (ref 0.44–1.00)
Calcium: 8.9 mg/dL (ref 8.9–10.3)
GFR calc Af Amer: >60 mL/min (ref >60)
GFR calc non Af Amer: >60 mL/min (ref >60)
GLUCOSE: 94 mg/dL (ref 65–99)
POTASSIUM: 3.7 mmol/L (ref 3.5–5.1)
Sodium: 138 mmol/L (ref 135–145)

## 2017-01-09 LAB — GLUCOSE, CAPILLARY
GLUCOSE-CAPILLARY: 87 mg/dL (ref 65–99)
GLUCOSE-CAPILLARY: 96 mg/dL (ref 65–99)
GLUCOSE-CAPILLARY: 98 mg/dL (ref 65–99)
Glucose-Capillary: 83 mg/dL (ref 65–99)

## 2017-01-09 LAB — CBC
HEMATOCRIT: 40 % (ref 36.0–46.0)
HEMOGLOBIN: 13.9 g/dL (ref 12.0–15.0)
MCH: 32.3 pg (ref 26.0–34.0)
MCHC: 34.8 g/dL (ref 30.0–36.0)
MCV: 92.8 fL (ref 78.0–100.0)
Platelets: 227 10*3/uL (ref 150–400)
RBC: 4.31 MIL/uL (ref 3.87–5.11)
RDW: 14 % (ref 11.5–15.5)
WBC: 10.9 10*3/uL — ABNORMAL HIGH (ref 4.0–10.5)

## 2017-01-09 LAB — AMMONIA: Ammonia: 28 umol/L (ref 9–35)

## 2017-01-09 LAB — MAGNESIUM: Magnesium: 2 mg/dL (ref 1.7–2.4)

## 2017-01-09 LAB — CK: CK TOTAL: 260 U/L — AB (ref 38–234)

## 2017-01-09 MED ORDER — POTASSIUM CHLORIDE CRYS ER 20 MEQ PO TBCR
40.0000 meq | EXTENDED_RELEASE_TABLET | Freq: Once | ORAL | Status: DC
  Filled 2017-01-09: qty 2

## 2017-01-09 MED ORDER — FOLIC ACID 5 MG/ML IJ SOLN
1.0000 mg | Freq: Every day | INTRAMUSCULAR | Status: DC
  Administered 2017-01-10 – 2017-01-11 (×2): 1 mg via INTRAVENOUS
  Filled 2017-01-09 (×2): qty 0.2

## 2017-01-09 MED ORDER — LORAZEPAM 2 MG/ML IJ SOLN: 2.0000 mg | INTRAMUSCULAR | Status: DC

## 2017-01-09 MED ORDER — POTASSIUM CHLORIDE 10 MEQ/100ML IV SOLN
10.0000 meq | INTRAVENOUS | Status: AC
  Administered 2017-01-09 (×2): 10 meq via INTRAVENOUS
  Filled 2017-01-09 (×2): qty 100

## 2017-01-09 MED ORDER — LORAZEPAM 2 MG/ML IJ SOLN: 1.0000 mg | INTRAMUSCULAR | Status: DC

## 2017-01-09 MED ORDER — DEXMEDETOMIDINE HCL IN NACL 200 MCG/50ML IV SOLN: 0.4000 ug/kg/h | INTRAVENOUS | Status: DC

## 2017-01-09 MED ORDER — DEXMEDETOMIDINE HCL IN NACL 400 MCG/100ML IV SOLN
0.4000 ug/kg/h | INTRAVENOUS | Status: DC
  Administered 2017-01-09: 0.5 ug/kg/h via INTRAVENOUS
  Administered 2017-01-10: 0.6 ug/kg/h via INTRAVENOUS
  Administered 2017-01-10: 1 ug/kg/h via INTRAVENOUS
  Administered 2017-01-11: 0.8 ug/kg/h via INTRAVENOUS
  Administered 2017-01-11: 1 ug/kg/h via INTRAVENOUS
  Administered 2017-01-11 (×2): 1.2 ug/kg/h via INTRAVENOUS
  Administered 2017-01-12: 0.4 ug/kg/h via INTRAVENOUS
  Administered 2017-01-12: 1.2 ug/kg/h via INTRAVENOUS
  Filled 2017-01-09 (×11): qty 100

## 2017-01-09 MED ORDER — SODIUM CHLORIDE 0.9 % IV SOLN: 0.4000 ug/kg/h | INTRAVENOUS | Status: DC

## 2017-01-09 MED ORDER — LORAZEPAM 2 MG/ML IJ SOLN
2.0000 mg | Freq: Once | INTRAMUSCULAR | Status: AC
  Administered 2017-01-09: 2 mg via INTRAVENOUS

## 2017-01-09 NOTE — Consult Note (Signed)
Name: Laura [name] MRN: [id number] DOB: [birth year]    ADMISSION DATE:  01/07/2017 CONSULTATION DATE:  01/09/2017  REFERRING MD :  Dr. Tyrell Antonio   CHIEF COMPLAINT:  Withdrawal   HISTORY OF PRESENT ILLNESS:  Information obtained from medical record as patient is encephalopathic  46 year old female with PMH of Anxiety and Substance/ETOH Abuse  Presents to ED on 10/22 after intentional drug overdose with presumed Seroquel, benadryl, and ETOH. Prior to taking drugs patient called her friend and told her that she intended to do this as an suicide attempt. While in ED patient was confused, tachycardiac, and had a brief tonic-clonic seizure which lasted 15 seconds. On 10/24 PCCM was asked to consult for increased agitation and possible precedex infusion.     SIGNIFICANT EVENTS  10/22 > Presented to ED > Intentional OD   STUDIES:  CXR 10/22 > No acute   PAST MEDICAL HISTORY :   has a past medical history of Anxiety and Substance abuse ([place]).  has a past surgical history that includes Back surgery and Knee surgery. Prior to Admission medications   Medication Sig Start Date End Date Taking? Authorizing Provider  DULoxetine (CYMBALTA) 30 MG capsule Take 30 mg by mouth daily.   Yes [provider]  HYDROcodone-acetaminophen (NORCO/VICODIN) 5-325 MG tablet Take 1 tablet by mouth every 6 (six) hours as needed for moderate pain.   Yes [provider]  pregabalin (LYRICA) 150 MG capsule Take 1 capsule (150 mg total) by mouth 2 (two) times daily. 12/11/16  Yes Vivi Barrack, MD  QUEtiapine (SEROQUEL) 400 MG tablet Takes 1 tablet in the am, 1 at noon and 2 at bedtime. Patient taking differently: Take 400 mg by mouth 4 (four) times daily.  12/31/16  Yes Vivi Barrack, MD   No Known Allergies  FAMILY HISTORY:  Family history is unknown by patient. SOCIAL HISTORY:  reports that she has been smoking.  She has been smoking about 1.00 pack per day. She has never used smokeless  tobacco. She reports that she drinks alcohol. She reports that she does not use drugs.  REVIEW OF SYSTEMS:   Unable to review as patient is confused   SUBJECTIVE:   VITAL SIGNS: Temp:  [97.4 F (36.3 C)-97.9 F (36.6 C)] 97.5 F (36.4 C) (10/24 1802) Pulse Rate:  [26-100] 82 (10/24 1802) Resp:  [17-28] 18 (10/24 1548) BP: (114-156)/(69-132) 127/97 (10/24 1700) SpO2:  [79 %-100 %] 96 % (10/24 1802)  PHYSICAL EXAMINATION: General:  Adult female, no distress, in 4 point-restraints  Neuro:  Alert, oriented x 2, follows commands  HEENT:  Dry MM  Cardiovascular:  Tachy, no MRG  Lungs:  Clear breath sounds, non-labored  Abdomen:  Active bowel sounds, tenderness to lower ABD Musculoskeletal:  -edema  Skin:  Warm, dry    Recent Labs Lab 01/08/17 0335 01/08/17 1452 01/09/17 0941  NA 141 137 138  K 3.9 3.4* 3.7  CL 113* 112* 110  CO2 19* 18* 21*  BUN 9 6 <5*  CREATININE 0.78 0.68 0.54  GLUCOSE 84 92 94    Recent Labs Lab 01/07/17 2107  01/07/17 2333 01/08/17 0335 01/09/17 0941  HGB 15.3*  < > 13.3 12.2 13.9  HCT 45.3  < > 39.0 37.0 40.0  WBC 12.0*  --   --  11.5* 10.9*  PLT 256  --   --  246 227  < > = values in this interval not displayed. Dg Chest Portable 1 View  Result Date: 01/07/2017 CLINICAL DATA:  46 year old female with overdose. EXAM: PORTABLE CHEST 1 VIEW COMPARISON:  None. FINDINGS: The heart size and mediastinal contours are within normal limits. Both lungs are clear. The visualized skeletal structures are unremarkable. IMPRESSION: No active disease. Electronically Signed   By: Anner Crete M.D.   On: 01/07/2017 22:49    ASSESSMENT / PLAN:  Withdrawal H/O Substance Abuse  -CIWA scores today have been 14-30 -Has received 18 mg Ativan this Shift  -Currently patient is HD stable, HR 80-100  -Confused, however able to reoriented  Plan  -Continue CIWA Protocol  -Change PRN Q4H Ativan to Scheduled and increase to 2 mg  -For CIWA > 20 use 3 mg per  protocol (patient has only received 2 mg doses for most of the day with increased CIWA scores) -Place Foley catheter > patient has had no documented output this shift and is complaining of lower ABD pain > after initial placement had greater than one liter output  -Have informed bedside nurse to call if patient remains with Increase CIWA after these interventions have been made > Will then start Precedex Gtt and move to ICU   Intentional Overdose with SI  Plan  -Continue Bedside Sitter  -Continue Suicide precautions  -Will need Psych Consult    Hayden Pedro, AGACNP-BC [place]  Pgr: [phone]  PCCM Pgr: [phone]
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
## 2017-01-09 NOTE — Progress Notes (Signed)
RN called about patient's CIWA score being 39, we discussed patient's current CIWA regimen, I suggested that primary MD be called and request stepdown CIWA order set for patient. RN was able to get the stepdown order set ordered.   I called at 130 for an update, charge RN stated that patient's withdrawal symptoms improved.  No RR RN interventions

## 2017-01-09 NOTE — Significant Event (Signed)
Rapid Response Event Note  Overview:  Called to evaluate patient Time Called: 1846 Arrival Time: 1850 Event Type: Other (Comment)  Initial Focused Assessment:  Called to evaluate patient for increasing agitation.  On my arrival to patients room, RN and sitter at bedside.  My assessment, patient has mitss on attempting to remove equipment/mitts etc, very agitated, attempting to climb out of bed, swinging and kicking staff.  Pateint is also hollering out and want to call the police   Interventions:  Spoke with Dr. Tyrell Antonio. 2mg  IV ativan given as per order.    Plan of Care (if not transferred):  MD states will send night coverage to come see patient.    Event Summary: RN to call if assistance needed   at      at          [facility], [place]

## 2017-01-09 NOTE — Progress Notes (Addendum)
PROGRESS NOTE    Laura [name]  ZJQ:[id number] DOB: 1970/04/30 DOA: 01/07/2017 PCP: Vivi Barrack, MD    Brief Narrative:  Laura [name] is a 46 y.o. female with history of depression and substance abuse and alcohol abuse was brought to the ER after patient had intentionally overdosed with drugs.  Patient prior to taking the drugs had called her friends that she is about to take the medications with suicidal ideation.  Patient's parents came to check on the patient and was found on the floor and EMS was called.  ED Course: In the ER patient is found to be tachycardic confused and had myoclonic jerks.  Patient briefly had an episode of tonic-clonic seizure which lasted for 15 seconds and was given Ativan 1 mg IV.  The patient was started on fluids.  EKG was showing sinus tachycardia with prolonged QTC of 596 ms.  Patient was given 2 g of IV magnesium sulfate.  Poison control was notified.  The likely medications as per the ER physician who provided most of the history, patient put out taken, is Seroquel and Benadryl.  Alcohol levels are positive.  Acetaminophen level and salicylates levels were negative.   Assessment & Plan:   Principal Problem:   Acute encephalopathy Active Problems:   Substance abuse (HCC)   MDD (major depressive disorder), recurrent severe, without psychosis ([place])   Alcohol use disorder, severe, dependence ([place])   Drug overdose   Intentional OD: presumed Seroquel and Benadryl. ETOH Salicylate and tylenol levels normal. Initial Lactic acid >12 w/ rapid correction w/ IVF. UDS negative. ETOH mildly elevated at 88. Poison control contacted by admitting physician who recommended close monitoring, tele/EKG and bicarb bolus of 1-2mg /kg if QRS widens.  - Suicide precatuions w/ sitter in room - psych eval when medically stable/awake - IVF -lactic acid normalized.  -Continue with IV fluids.   ETOH use: Alcohol [place];  - CIWA - Thiamine and folate  replacement.  -CIWA score yesterday at 39 --this am at 19.   Addendum:  Called by nurse a around 4-;30 that patient has been agitated. Patient received 3 mg ativan.  I advised nurse to give one mg of ativan. I came and Evaluated patient. Patient was sleeping and calm on my assessment.  I spoke with nurse again around 6, patient was doing better, CIWA score was 19 decreasing.  At 6;30 nurse informed me that patient hit staff and was agitated. 2 mg ativan was give. After that patient was calm.  At 7 pm patient became agitated. Received called from Rapid respond. Another 2 mg ativan ordered.  At this time CCM has been consulted for precedex Gtt   Seizure: likely secondary to OD attempt. No further episodes after single dose of Ativan. No h/o the same.  - Seizure precautions - Ativan PRN - Neuro consult if continues after OD resolves.   Hypoglycemia: glucose continues to drop during admission. Mild. Likely from ongoing metabolic acidosis/requirements from tremor seizure and agitation. - D5 1/2NS  Hypokalemia; replete orally.    DVT prophylaxis: lovenox Code Status: full Family Communication: none Disposition Plan: pending improvement and psych eval   Consultants:   none  Procedures:   none  Antimicrobials:   none    Subjective: She is alert, speech is not clear. She is able to tell me her name, she relates she has a son who is 63 year old.    Objective: Vitals:   01/09/17 0309 01/09/17 0400 01/09/17 0600 01/09/17 0810  BP: Marland Kitchen)  123/96 (!) 139/94 121/86 136/90  Pulse: 80 81 95   Resp: 20 (!) 22    Temp: 97.9 F (36.6 C)   (!) 97.4 F (36.3 C)  TempSrc: Axillary     SpO2: 99% 99%      Intake/Output Summary (Last 24 hours) at 01/09/17 0844 Last data filed at 01/09/17 0402  Gross per 24 hour  Intake          3829.17 ml  Output              400 ml  Net          3429.17 ml   There were no vitals filed for this visit.  Examination:  General exam: alert,  anxious. No distress,  Respiratory system; CTA, normal respiratory effort.  Cardiovascular system: S 1, S 2 RRR Gastrointestinal system: BS present, soft, nt Central nervous system: moves all extremities.  Extremities: Symmetric 5 x 5 power. Skin: No rashes, lesions or ulcers Psychiatry: alert to person. Confuse.     Data Reviewed: I have personally reviewed following labs and imaging studies  CBC:  Recent Labs Lab 01/07/17 2107 01/07/17 2114 01/07/17 2333 01/08/17 0335  WBC 12.0*  --   --  11.5*  NEUTROABS 8.6*  --   --   --   HGB 15.3* 16.3* 13.3 12.2  HCT 45.3 48.0* 39.0 37.0  MCV 94.6  --   --  93.2  PLT 256  --   --  161   Basic Metabolic Panel:  Recent Labs Lab 01/07/17 2107 01/07/17 2114 01/07/17 2333 01/08/17 0335 01/08/17 1452  NA 143 147* 143 141 137  K 3.3* 3.2* 4.2 3.9 3.4*  CL 113* 112* 117* 113* 112*  CO2 13*  --   --  19* 18*  GLUCOSE 111* 115* 99 84 92  BUN 9 11 10 9 6   CREATININE 0.81 0.60 0.60 0.78 0.68  CALCIUM 8.8*  --   --  8.3* 8.2*  MG 1.8  --   --  2.2  --    GFR: CrCl cannot be calculated (Unknown ideal weight.). Liver Function Tests:  Recent Labs Lab 01/07/17 2107 01/08/17 0335  AST 35 28  ALT 22 20  ALKPHOS 76 66  BILITOT 0.6 0.8  PROT 7.2 6.2*  ALBUMIN 3.8 3.3*    Recent Labs Lab 01/07/17 2107  LIPASE 18   No results for input(s): AMMONIA in the last 168 hours. Coagulation Profile: No results for input(s): INR, PROTIME in the last 168 hours. Cardiac Enzymes:  Recent Labs Lab 01/08/17 0124 01/08/17 1452  CKTOTAL 119 364*  TROPONINI <0.03  --    BNP (last 3 results) No results for input(s): PROBNP in the last 8760 hours. HbA1C: No results for input(s): HGBA1C in the last 72 hours. CBG:  Recent Labs Lab 01/08/17 1135 01/08/17 1513 01/08/17 1717 01/08/17 2315 01/09/17 0508  GLUCAP 66 104* 85 80 87   Lipid Profile: No results for input(s): CHOL, HDL, LDLCALC, TRIG, CHOLHDL, LDLDIRECT in the last 72  hours. Thyroid Function Tests: No results for input(s): TSH, T4TOTAL, FREET4, T3FREE, THYROIDAB in the last 72 hours. Anemia Panel: No results for input(s): VITAMINB12, FOLATE, FERRITIN, TIBC, IRON, RETICCTPCT in the last 72 hours. Sepsis Labs:  Recent Labs Lab 01/07/17 2333 01/08/17 0124 01/08/17 0335 01/08/17 1452  LATICACIDVEN 6.31* 3.3* 3.1* 1.1    Recent Results (from the past 240 hour(s))  MRSA PCR Screening     Status: None   Collection Time:  01/08/17  5:03 PM  Result Value Ref Range Status   MRSA by PCR NEGATIVE NEGATIVE Final    Comment:        The GeneXpert MRSA Assay (FDA approved for NASAL specimens only), is one component of a comprehensive MRSA colonization surveillance program. It is not intended to diagnose MRSA infection nor to guide or monitor treatment for MRSA infections.          Radiology Studies: Dg Chest Portable 1 View  Result Date: 01/07/2017 CLINICAL DATA:  46 year old female with overdose. EXAM: PORTABLE CHEST 1 VIEW COMPARISON:  None. FINDINGS: The heart size and mediastinal contours are within normal limits. Both lungs are clear. The visualized skeletal structures are unremarkable. IMPRESSION: No active disease. Electronically Signed   By: Anner Crete M.D.   On: 01/07/2017 22:49        Scheduled Meds: . chlorhexidine  15 mL Mouth Rinse BID  . enoxaparin (LOVENOX) injection  40 mg Subcutaneous Q24H  . folic acid  1 mg Oral Daily  . mouth rinse  15 mL Mouth Rinse q12n4p  . multivitamin with minerals  1 tablet Oral Daily  . potassium chloride  40 mEq Oral Once  . thiamine  100 mg Oral Daily   Or  . thiamine  100 mg Intravenous Daily   Continuous Infusions: . dextrose 5 % and 0.45% NaCl 100 mL/hr at 01/08/17 2228     LOS: 1 day    Time spent: >35 min    Loyal Rudy A, MD [facility]   If 7PM-7AM, please contact night-coverage [email or web address] Password [facility] 01/09/2017, 8:44 AM

## 2017-01-09 NOTE — Progress Notes (Signed)
Pt. Combative, trying to hit sitter and seeing things, says she wants to go home to son, uncertain the age of her son but trying to get out of bed. Ativan has been given every hour and seems not to help much. MD notified.

## 2017-01-09 NOTE — Progress Notes (Signed)
eLink Physician-Brief Progress Note Patient Name: Laura [name] DOB: 1971/03/08 MRN: [id number]   Date of Service  01/09/2017  HPI/Events of Note  PCCM asked to evaluate patient for precedex infusion 46 yo Admitted for OD of meds, now very agitated, consdier ETOH withdrawal   eICU Interventions  May need close monitoring Consider precedex infusion      Intervention Category Evaluation Type: New Patient Evaluation  Laura [name] 01/09/2017, 7:27 PM

## 2017-01-10 DIAGNOSIS — G9340 Encephalopathy, unspecified: Secondary | ICD-10-CM

## 2017-01-10 LAB — GLUCOSE, CAPILLARY
GLUCOSE-CAPILLARY: 104 mg/dL — AB (ref 65–99)
Glucose-Capillary: 100 mg/dL — ABNORMAL HIGH (ref 65–99)
Glucose-Capillary: 106 mg/dL — ABNORMAL HIGH (ref 65–99)

## 2017-01-10 LAB — BASIC METABOLIC PANEL
ANION GAP: 8 (ref 5–15)
BUN: 7 mg/dL (ref 6–20)
CO2: 21 mmol/L — AB (ref 22–32)
Calcium: 8.7 mg/dL — ABNORMAL LOW (ref 8.9–10.3)
Chloride: 109 mmol/L (ref 101–111)
Creatinine, Ser: 0.58 mg/dL (ref 0.44–1.00)
GFR calc Af Amer: >60 mL/min (ref >60)
GFR calc non Af Amer: >60 mL/min (ref >60)
Glucose, Bld: 106 mg/dL — ABNORMAL HIGH (ref 65–99)
POTASSIUM: 3.5 mmol/L (ref 3.5–5.1)
Sodium: 138 mmol/L (ref 135–145)

## 2017-01-10 LAB — CBC
HEMATOCRIT: 41.6 % (ref 36.0–46.0)
HEMOGLOBIN: 13.9 g/dL (ref 12.0–15.0)
MCH: 31.2 pg (ref 26.0–34.0)
MCHC: 33.4 g/dL (ref 30.0–36.0)
MCV: 93.3 fL (ref 78.0–100.0)
Platelets: 233 10*3/uL (ref 150–400)
RBC: 4.46 MIL/uL (ref 3.87–5.11)
RDW: 13.9 % (ref 11.5–15.5)
WBC: 7.7 10*3/uL (ref 4.0–10.5)

## 2017-01-10 LAB — PHOSPHORUS: Phosphorus: 4 mg/dL (ref 2.5–4.6)

## 2017-01-10 LAB — MAGNESIUM: Magnesium: 1.9 mg/dL (ref 1.7–2.4)

## 2017-01-10 MED ORDER — MAGNESIUM SULFATE 2 GM/50ML IV SOLN
2.0000 g | Freq: Once | INTRAVENOUS | Status: AC
  Administered 2017-01-10: 2 g via INTRAVENOUS
  Filled 2017-01-10: qty 50

## 2017-01-10 MED ORDER — LORAZEPAM 2 MG/ML IJ SOLN
0.5000 mg | INTRAMUSCULAR | Status: DC | PRN
  Administered 2017-01-10 – 2017-01-11 (×3): 1 mg via INTRAVENOUS
  Filled 2017-01-10 (×3): qty 1

## 2017-01-10 MED ORDER — LORAZEPAM 2 MG/ML IJ SOLN
2.0000 mg | Freq: Once | INTRAMUSCULAR | Status: DC | PRN
  Filled 2017-01-10: qty 1

## 2017-01-10 MED ORDER — LORAZEPAM 2 MG/ML IJ SOLN
0.5000 mg | INTRAMUSCULAR | Status: DC | PRN
  Administered 2017-01-10: 1 mg via INTRAVENOUS
  Filled 2017-01-10: qty 1

## 2017-01-10 NOTE — Progress Notes (Signed)
PCCM Interval Note  Called to bedside by RN in reference to patient saying she would leave AMA.  On assessment patient is oriented to person, place but not event or time.  Patient has been cooperative and therefore precedex turned off ~ 1500 but has had increasing agitation.    P:  Restart precedex  Increase ativan prn sedation from q 4hr to q 2 hrs Will start IVC paperwork until patient can be medically cleared and cleared by psychiatry once oriented.   Safety sitter at bedside Ongoing seizure precautions   Kennieth Rad, AGACNP-BC [facility] [facility] Pgr: [phone] or if no answer [phone] 01/10/2017, 4:40 PM

## 2017-01-10 NOTE — Consult Note (Signed)
Name: Laura [name] MRN: [id number] DOB: [birth year]    ADMISSION DATE:  01/07/2017 CONSULTATION DATE:  01/09/2017  REFERRING MD :  Dr. Tyrell Antonio   CHIEF COMPLAINT:  Withdrawal   Brief Patient Summary:  46 year old female with PMH of Anxiety and Substance/ETOH Abuse  Presents to ED on 10/22 after intentional drug overdose with presumed Seroquel, benadryl, and ETOH. Prior to taking drugs patient called her friend and told her that she intended to do this as an suicide attempt. While in ED patient was confused, tachycardiac, and had a brief tonic-clonic seizure which lasted 15 seconds. On 10/24 PCCM was asked to consult for increased agitation and possible precedex infusion.     SUBJECTIVE:  Continues to protect airway, on room air Precedex at 0.4 mcg/kg/hr Remains confused and in bilateral wrist restraints but agitation improved  VITAL SIGNS: Temp:  [97.4 F (36.3 C)-98.9 F (37.2 C)] 97.5 F (36.4 C) (10/25 0330) Pulse Rate:  [26-114] 63 (10/25 0700) Resp:  [12-28] 12 (10/25 0700) BP: (95-156)/(63-132) 95/68 (10/25 0700) SpO2:  [79 %-100 %] 98 % (10/25 0700) Weight:  [151 lb (68.5 kg)] 151 lb (68.5 kg) (10/24 2208)  PHYSICAL EXAMINATION: General:  Adult female lying in bed in NAD, in bilateral soft wrist restraints, safety sitter at bedside HEENT: MM pink/dry, pupils 5/=/reactive Neuro: Awake, f/c, confused to time/place/events, MAE CV: s1s2 rrr  PULM: even/non-labored, lungs bilaterally clear GI: soft, non-tender, bs active  Extremities: warm/dry, no edema  Skin: no rashes     Recent Labs Lab 01/08/17 1452 01/09/17 0941 01/10/17 0217  NA 137 138 138  K 3.4* 3.7 3.5  CL 112* 110 109  CO2 18* 21* 21*  BUN 6 <5* 7  CREATININE 0.68 0.54 0.58  GLUCOSE 92 94 106*    Recent Labs Lab 01/08/17 0335 01/09/17 0941 01/10/17 0217  HGB 12.2 13.9 13.9  HCT 37.0 40.0 41.6  WBC 11.5* 10.9* 7.7  PLT 246 227 233   No results found.   SIGNIFICANT EVENTS  10/22  > Presented to ED > Intentional OD  10/24 > ETOH wd/ 30mg /24 hrs Ativan IV, combative, tx ICU overnight for precedex  STUDIES:  CXR 10/22 > No acute  ASSESSMENT / PLAN:  Withdrawal H/O Substance Abuse  -CIWA scores10/24 ~ 14-30 w/ 30 mg Ativan given over 24 hour period - ETOH 88 on 10/23 Plan  - continue Precedex gtt - RASS goal 0/ -1 - hourly neuro checks - CBG monitoring q 4hr - continue thiamine, folate, and MVI - continue D5/ 0.45 NS at 75 ml/hr while NPO - monitor QTc   Intentional Overdose with SI  - w/ seroquel and benadryl Plan  - Continue Bedside Sitter  - Continue Suicide precautions  - Will need Psych Consult when awake/oriented - prolonged QTc resolved on EKG 10/24  Urinary Retention - w/ 2L UOP returned 10/24 P:  Foley placed Monitor I/Os Remove and assess for retention when delirium improves  Seizure - Brief episode on admit, no prior hx, likely precipitated by ETOH and drug OD - no further episodes P:  Continue with withdrawal protocol Prn Ativan for seizure only Neurology consult if reoccurs Seizure precautions  Acute Hep C - HCV RNA 1,600,00, Hep C Ab + - HIV neg P:  Trend LFTs Check coags Will need outpatient follow-up with ID to establish treatment   DVT prophylaxis: SCDs, Lovenox  SUP: not indicated Diet: NPO Activity: bedrest Disposition : ICU  Kennieth Rad, AGACNP-BC [place] [facility] Pgr: [phone] or if no answer [phone] 01/10/2017, 7:56 AM

## 2017-01-11 ENCOUNTER — Encounter (HOSPITAL_COMMUNITY): Payer: Self-pay

## 2017-01-11 LAB — GLUCOSE, CAPILLARY
GLUCOSE-CAPILLARY: 108 mg/dL — AB (ref 65–99)
Glucose-Capillary: 109 mg/dL — ABNORMAL HIGH (ref 65–99)
Glucose-Capillary: 92 mg/dL (ref 65–99)
Glucose-Capillary: 78 mg/dL (ref 65–99)

## 2017-01-11 LAB — COMPREHENSIVE METABOLIC PANEL
ALBUMIN: 3.4 g/dL — AB (ref 3.5–5.0)
ALT: 24 U/L (ref 14–54)
AST: 27 U/L (ref 15–41)
Alkaline Phosphatase: 72 U/L (ref 38–126)
Anion gap: 8 (ref 5–15)
BUN: 8 mg/dL (ref 6–20)
CO2: 20 mmol/L — AB (ref 22–32)
CREATININE: 0.56 mg/dL (ref 0.44–1.00)
Calcium: 8.7 mg/dL — ABNORMAL LOW (ref 8.9–10.3)
Chloride: 110 mmol/L (ref 101–111)
GFR calc Af Amer: >60 mL/min (ref >60)
GFR calc non Af Amer: >60 mL/min (ref >60)
Glucose, Bld: 107 mg/dL — ABNORMAL HIGH (ref 65–99)
POTASSIUM: 3.7 mmol/L (ref 3.5–5.1)
SODIUM: 138 mmol/L (ref 135–145)
Total Bilirubin: 1 mg/dL (ref 0.3–1.2)
Total Protein: 6.8 g/dL (ref 6.5–8.1)

## 2017-01-11 LAB — PHOSPHORUS: PHOSPHORUS: 3.3 mg/dL (ref 2.5–4.6)

## 2017-01-11 LAB — MAGNESIUM: Magnesium: 2 mg/dL (ref 1.7–2.4)

## 2017-01-11 LAB — PROTIME-INR
INR: 0.93
Prothrombin Time: 12.4 seconds (ref 11.4–15.2)

## 2017-01-11 MED ORDER — HALOPERIDOL LACTATE 5 MG/ML IJ SOLN
5.0000 mg | INTRAMUSCULAR | Status: DC | PRN
  Filled 2017-01-11: qty 1

## 2017-01-11 MED ORDER — QUETIAPINE FUMARATE 100 MG PO TABS
200.0000 mg | ORAL_TABLET | Freq: Three times a day (TID) | ORAL | Status: DC
  Administered 2017-01-11 – 2017-01-14 (×10): 200 mg via ORAL
  Filled 2017-01-11 (×5): qty 1
  Filled 2017-01-11 (×2): qty 2
  Filled 2017-01-11 (×4): qty 1

## 2017-01-11 MED ORDER — PREGABALIN 75 MG PO CAPS
150.0000 mg | ORAL_CAPSULE | Freq: Two times a day (BID) | ORAL | Status: DC
Start: 2017-01-11 — End: 2017-01-14
  Administered 2017-01-11 – 2017-01-14 (×7): 150 mg via ORAL
  Filled 2017-01-11: qty 6
  Filled 2017-01-11 (×6): qty 2

## 2017-01-11 MED ORDER — HALOPERIDOL LACTATE 5 MG/ML IJ SOLN
5.0000 mg | Freq: Four times a day (QID) | INTRAMUSCULAR | Status: DC | PRN
  Administered 2017-01-11: 5 mg via INTRAVENOUS
  Filled 2017-01-11: qty 1

## 2017-01-11 MED ORDER — CLONIDINE HCL 0.1 MG PO TABS
0.1000 mg | ORAL_TABLET | Freq: Three times a day (TID) | ORAL | Status: DC
  Administered 2017-01-11 – 2017-01-14 (×9): 0.1 mg via ORAL
  Filled 2017-01-11 (×11): qty 1

## 2017-01-11 MED ORDER — DULOXETINE HCL 30 MG PO CPEP
30.0000 mg | ORAL_CAPSULE | Freq: Every day | ORAL | Status: DC
  Administered 2017-01-11 – 2017-01-14 (×4): 30 mg via ORAL
  Filled 2017-01-11 (×4): qty 1

## 2017-01-11 MED ORDER — FOLIC ACID 1 MG PO TABS
1.0000 mg | ORAL_TABLET | Freq: Every day | ORAL | Status: DC
  Administered 2017-01-12 – 2017-01-14 (×3): 1 mg via ORAL
  Filled 2017-01-11 (×3): qty 1

## 2017-01-11 MED ORDER — HALOPERIDOL LACTATE 5 MG/ML IJ SOLN: 5.0000 mg | INTRAMUSCULAR | Status: DC | PRN

## 2017-01-11 MED ORDER — HALOPERIDOL LACTATE 5 MG/ML IJ SOLN: 5.0000 mg | Freq: Four times a day (QID) | INTRAMUSCULAR | Status: DC | PRN

## 2017-01-11 NOTE — Progress Notes (Signed)
PULMONARY / CRITICAL CARE MEDICINE   Name: Laura [name] MRN: [id number] DOB: [birth year]    ADMISSION DATE:  01/07/2017 CONSULTATION DATE:    Laura Chance MD:  Gareth Morgan, MD  CHIEF COMPLAINT:  Drug overdose  HISTORY OF PRESENT ILLNESS:  Laura [name] is a 46 y.o. female with history of depression and substance abuse and alcohol abuse was brought to the ER after patient had intentionally overdosed with drugs.  Patient prior to taking the drugs had called her friends that she is about to take the medications with suicidal ideation.  Patient's parents came to check on the patient and was found on the floor and EMS was called.  ED Course: In the ER patient is found to be tachycardic confused and had myoclonic jerks.  Patient briefly had an episode of tonic-clonic seizure which lasted for 15 seconds and was given Ativan 1 mg IV.  The patient was started on fluids.  EKG was showing sinus tachycardia with prolonged QTC of 596 ms.  Patient was given 2 g of IV magnesium sulfate.  Poison control was notified.  The likely medications as per the ER physician who provided most of the history, patient could have taken, are Seroquel and Benadryl.  Alcohol levels were positive.  Acetaminophen level and salicylates levels were negative.  FAMILY HISTORY:  Her indicated that her mother is deceased. She indicated that her father is deceased. She indicated that her sister is deceased.   SOCIAL HISTORY: She  reports that she has been smoking.  She has been smoking about 1.00 pack per day. She has never used smokeless tobacco. She reports that she drinks alcohol. She reports that she does not use drugs.  REVIEW OF SYSTEMS:   Review of Systems  Constitutional: Negative for chills and diaphoresis.  HENT: Negative for ear pain and sinus pain.   Eyes: Negative for photophobia and redness.  Respiratory: Negative for cough and shortness of breath.   Cardiovascular: Negative for chest pain and leg  swelling.  Gastrointestinal: Positive for abdominal pain. Negative for constipation, diarrhea, nausea and vomiting.  Genitourinary: Negative for flank pain and urgency.  Musculoskeletal: Negative for myalgias.  Neurological: Negative for dizziness and headaches.  Psychiatric/Behavioral: Positive for suicidal ideas. The patient is not nervous/anxious.    SUBJECTIVE:  Patient IVC'd overnight after precedex discontinued as she attempted to rise and wanted to leave but was not oriented to an extent where she was capable of making self decisions.  Otherwise slept well overnight as per patient.  VITAL SIGNS: BP (!) 155/109   Pulse (!) 52   Temp 97.9 F (36.6 C) (Oral)   Resp 12   Wt 151 lb (68.5 kg)   SpO2 99%   BMI 22.96 kg/m   HEMODYNAMICS:    VENTILATOR SETTINGS:    INTAKE / OUTPUT: I/O last 3 completed shifts: In: 33 [I.V.:3703; IV Piggyback:50] Out: 2952 [Urine:3175]  PHYSICAL EXAMINATION: General:  Appears stated age, in no acute distress Neuro:  Alert, oriented to person only HEENT:  PERRL, EOM itact Cardiovascular:  Regular rate and rhythm Lungs:  Clear to ascultation bilaterally without wheezing Abdomen:  Soft, no appreciable tenderness Musculoskeletal: able to move all extremities, strength intact Skin:  No rashes or lesions observed  LABS:  BMET  Recent Labs Lab 01/09/17 0941 01/10/17 0217 01/11/17 0231  NA 138 138 138  K 3.7 3.5 3.7  CL 110 109 110  CO2 21* 21* 20*  BUN <5* 7 8  CREATININE 0.54 0.58 0.56  GLUCOSE 94 106* 107*    Electrolytes  Recent Labs Lab 01/09/17 0941 01/10/17 0217 01/11/17 0231  CALCIUM 8.9 8.7* 8.7*  MG 2.0 1.9 2.0  PHOS  --  4.0 3.3    CBC  Recent Labs Lab 01/08/17 0335 01/09/17 0941 01/10/17 0217  WBC 11.5* 10.9* 7.7  HGB 12.2 13.9 13.9  HCT 37.0 40.0 41.6  PLT 246 227 233    Coag's  Recent Labs Lab 01/11/17 0231  INR 0.93    Sepsis Markers  Recent Labs Lab 01/08/17 0124 01/08/17 0335  01/08/17 1452  LATICACIDVEN 3.3* 3.1* 1.1    ABG No results for input(s): PHART, PCO2ART, PO2ART in the last 168 hours.  Liver Enzymes  Recent Labs Lab 01/08/17 0335 01/09/17 0941 01/11/17 0231  AST 28 46* 27  ALT 20 24 24   ALKPHOS 66 82 72  BILITOT 0.8 1.7* 1.0  ALBUMIN 3.3* 3.8 3.4*    Cardiac Enzymes  Recent Labs Lab 01/08/17 0124  TROPONINI <0.03    Glucose  Recent Labs Lab 01/09/17 1814 01/09/17 2334 01/10/17 0543 01/10/17 1106 01/10/17 2342 01/11/17 0613  GLUCAP 83 98 106* 104* 100* 109*    Imaging No results found.  STUDIES:  CXR 10/22> no acute cardiopulmonary abnormalities noted  CULTURES: MRSA negative screen  ANTIBIOTICS: None  SIGNIFICANT EVENTS: Admission to ICU on 10/24  LINES/TUBES: PIV  DISCUSSION: Laura [name] is a 46 year-old-female admitted for EtOH withdrawal and possible drug overdose required high rates of Ativan injections to control her agitation. She is in 4-point restraints as well given her propensity to strike staff members when agitated.   ASSESSMENT / PLAN:  PULMONARY A: No acute distress P:   Continue to monitor  CARDIOVASCULAR A:  Mildy bradycardic, pressure and pulses intact without concern P:  Continue to monitor  RENAL A:   Urinary output WNL Cr WNL No acute issues Electrolytes WNLs including phos, mag and potassium P:   Continue D5w w/ 1/2 NS @ 78ml/hr  GASTROINTESTINAL A:   Denied abd pain NO N/V/D or bloody stool/emesis  LVT's WNL's P:   No acute issues, continue to monitor  HEMATOLOGIC A:   CBC WNL on 10/25 P-INR WNL's P:  No indication to repeat  INFECTIOUS A:   Patient is afebrile, without distress, no leukocytosis or other indication of infectious etiology Patient is Hep C positive P:   No indication for antibiotics at this time  ENDOCRINE A:   Most recent TSH on 9/25 WNL Serum glucose WNL P:   Continue to monitor serum glucose given NPO status  NEUROLOGIC A:    Alert, oriented only to person and location P:   RASS goal: 4    FAMILY  - Updates: Patient alert and oriented, making decisions without assistance   - Inter-disciplinary family meet or Palliative Care meeting due by:  10/31    Kathi Ludwig, MD PGY-1 Internal Medicine  01/11/2017, 7:30 AM

## 2017-01-11 NOTE — Care Management Note (Signed)
Case Management Note  Patient Details  Name: Laura [name] MRN: [id number] Date of Birth: [birth year]  Subjective/Objective:    Pt admitted with AMS with ETOH withdrawl and intentional overdose             Action/Plan:    PTA from home - pt is on precedex drip and will need to be evaluated by pysch once off drip.  CSW following   Expected Discharge Date:                  Expected Discharge Plan:  Home/Self Care  In-House Referral:  Clinical Social Work  Discharge planning Services  CM Consult  Post Acute Care Choice:    Choice offered to:     DME Arranged:    DME Agency:     HH Arranged:    HH Agency:     Status of Service:     If discussed at [employer], dates discussed:    Additional Comments:  Maryclare Labrador, RN 01/11/2017, 2:32 PM

## 2017-01-11 NOTE — Progress Notes (Signed)
Pt still confused but pleasant and more cooperative than before. Pt happy that she is allowed to drink and have jello. Attempted to call Ex husband per pt request, but no answer. Will try again later.

## 2017-01-11 NOTE — Progress Notes (Signed)
Pt became extremely agitated, cursing at staff, pushed staff. RN was notified of pt condition.

## 2017-01-11 NOTE — Progress Notes (Signed)
   Pm bedside rounds  Agitrated again   plna Add haldol prn ; first dose now *(Qtc 460msec on monitor per RN)  Dr. Brand Males, M.D., [facility].C.P Pulmonary and Critical Care Medicine Staff Physician [place] [facility] Pager: [phone], If no answer or between  15:00h - 7:00h: call [phone]  01/11/2017 2:41 PM

## 2017-01-11 NOTE — Progress Notes (Addendum)
Pt is stating she wants to leave. After redirection and explaining where she is she still states she is leaving and she doesn't care what the "f" we say. Pt finally calm and back in bed. Very emotional and still confused.

## 2017-01-11 NOTE — Progress Notes (Signed)
STAFF NOTE: I, Dr Ann Lions have personally reviewed patient's available data, including medical history, events of note, physical examination and test results as part of my evaluation. I have discussed with resident/NP and other care providers such as pharmacist, RN and RRT.  In addition,  I personally evaluated patient and elicited key findings of   S: now under involuntary commitment. Still needing precedex but much improved. Confused but answering questions, less agitated. Asking for food. Sitter at bedside   O: looks much better RASS 0 on precedex CTA bilaterally ABd soft   Recent Labs Lab 01/08/17 0335 01/09/17 0941 01/10/17 0217  HGB 12.2 13.9 13.9  HCT 37.0 40.0 41.6  WBC 11.5* 10.9* 7.7  PLT 246 227 233    Recent Labs Lab 01/07/17 2107  01/08/17 0335 01/08/17 1452 01/09/17 0941 01/10/17 0217 01/11/17 0231  NA 143  < > 141 137 138 138 138  K 3.3*  < > 3.9 3.4* 3.7 3.5 3.7  CL 113*  < > 113* 112* 110 109 110  CO2 13*  --  19* 18* 21* 21* 20*  GLUCOSE 111*  < > 84 92 94 106* 107*  BUN 9  < > 9 6 <5* 7 8  CREATININE 0.81  < > 0.78 0.68 0.54 0.58 0.56  CALCIUM 8.8*  --  8.3* 8.2* 8.9 8.7* 8.7*  MG 1.8  --  2.2  --  2.0 1.9 2.0  PHOS  --   --   --   --   --  4.0 3.3  < > = values in this interval not displayed.  A: Acute encephalopathy - slowly improving; still needing precedex Mild low K  P: can advance diet as tolerated. Try reintroducing home meds Continue but aim to wean precedex and sitter off   .  Rest per NP/medical resident whose note is outlined above and that I agree with  The patient is critically ill with multiple organ systems failure and requires high complexity decision making for assessment and support, frequent evaluation and titration of therapies, application of advanced monitoring technologies and extensive interpretation of multiple databases.   Critical Care Time devoted to patient care services described in this note is  30  Minutes.  This time reflects time of care of this signee Dr Brand Males. This critical care time does not reflect procedure time, or teaching time or supervisory time of PA/NP/Med student/Med Resident etc but could involve care discussion time    Dr. Brand Males, M.D., [facility].C.P Pulmonary and Critical Care Medicine Staff Physician [place] [facility] Pager: [phone], If no answer or between  15:00h - 7:00h: call [phone]  01/11/2017 10:09 AM

## 2017-01-11 NOTE — Progress Notes (Signed)
Pt confused at this time. Stating she is leaving and she wants her clothes. States "you can't keep me here!". RN notified of pts confusing and conversation between myself Freight forwarder) and the pt.

## 2017-01-12 LAB — GLUCOSE, CAPILLARY
GLUCOSE-CAPILLARY: 113 mg/dL — AB (ref 65–99)
Glucose-Capillary: 112 mg/dL — ABNORMAL HIGH (ref 65–99)
Glucose-Capillary: 131 mg/dL — ABNORMAL HIGH (ref 65–99)

## 2017-01-12 LAB — MAGNESIUM: MAGNESIUM: 1.8 mg/dL (ref 1.7–2.4)

## 2017-01-12 LAB — BASIC METABOLIC PANEL
Anion gap: 10 (ref 5–15)
BUN: 7 mg/dL (ref 6–20)
CO2: 21 mmol/L — AB (ref 22–32)
CREATININE: 0.6 mg/dL (ref 0.44–1.00)
Calcium: 8.7 mg/dL — ABNORMAL LOW (ref 8.9–10.3)
Chloride: 106 mmol/L (ref 101–111)
Glucose, Bld: 120 mg/dL — ABNORMAL HIGH (ref 65–99)
Potassium: 3.2 mmol/L — ABNORMAL LOW (ref 3.5–5.1)
SODIUM: 137 mmol/L (ref 135–145)

## 2017-01-12 LAB — PHOSPHORUS: PHOSPHORUS: 3.8 mg/dL (ref 2.5–4.6)

## 2017-01-12 MED ORDER — POTASSIUM CHLORIDE CRYS ER 20 MEQ PO TBCR
30.0000 meq | EXTENDED_RELEASE_TABLET | ORAL | Status: AC
  Administered 2017-01-12 (×2): 30 meq via ORAL
  Filled 2017-01-12 (×2): qty 1

## 2017-01-12 MED ORDER — MAGNESIUM SULFATE 2 GM/50ML IV SOLN
2.0000 g | Freq: Once | INTRAVENOUS | Status: AC
  Administered 2017-01-12: 2 g via INTRAVENOUS
  Filled 2017-01-12: qty 50

## 2017-01-12 NOTE — Assessment & Plan Note (Addendum)
Asking to sign out AMA to go take care of her 44 year old (and at different times 42 year old) son. She feels she is safe to go  Home and only reason she attempted suicide was because another son died  PLAN IVC Sitter at bedside Needs psych eval - now that she is improved - we will call psych 01/13/2017

## 2017-01-12 NOTE — Progress Notes (Signed)
PULMONARY / CRITICAL CARE MEDICINE   Name: Laura [name] MRN: [id number] DOB: 01/11/1971 PCP Vivi Barrack, MD LOS 4 as of 01/12/2017     ADMISSION DATE:  01/07/2017    BRIEF Laura Whitebread Beatonis a 46 y.o.femalewith history of depression and substance abuse and alcohol abuse was brought to the ER after patient had intentionally overdosed with drugs. Patient prior to taking the drugs had called her friends that she is about to take the medications with suicidal ideation. Patient's parents came to check on the patient and was found on the floor and EMS was called.  ED Course:In the ER patient is found to be tachycardic confused and had myoclonic jerks. Patient briefly had an episode of tonic-clonic seizure which lasted for 15 seconds and was given Ativan 1 mg IV. The patient was started on fluids. EKG was showing sinus tachycardia with prolonged QTC of 596 ms. Patient was given 2 g of IV magnesium sulfate. Poison control was notified. The likely medications as per the ER physician who provided most of the history, patient could have taken, are Seroquel and Benadryl. Alcohol levels were positive. Acetaminophen level and salicylates levels were negative.  PAST MEDICAL HISTORY :  She  has a past medical history of Anxiety; Depression; ETOH abuse; Hepatitis C; and Substance abuse ([place]).  PAST SURGICAL HISTORY: She  has a past surgical history that includes Back surgery and Knee surgery.   EVENTS 01/07/2017 - admit   SUBJECTIVE/OVERNIGHT/INTERVAL HX 01/12/2017 - remains on precedex, scheduled clonidiine + prn haldol + prn ativan   VITAL SIGNS: BP (!) 77/56   Pulse (!) 57   Temp (!) 97.2 F (36.2 C) (Oral)   Resp 14   Wt 68.5 kg (151 lb)   SpO2 98%   BMI 22.96 kg/m   HEMODYNAMICS:    VENTILATOR SETTINGS:    INTAKE / OUTPUT: I/O last 3 completed shifts: In: 3224.1 [I.V.:3224.1] Out: 3550 [Urine:3550]     EXAM  General Appearance:    Sleeping    Head:    Normocephalic, without obvious abnormality, atraumatic  Eyes:    PERRL - yes, conjunctiva/corneas - clear      Ears:    Normal external ear canals, both ears  Nose:   NG tube - no  Throat:  ETT TUBE - no , OG tube - no  Neck:   Supple,  No enlargement/tenderness/nodules     Lungs:     Clear to auscultation bilaterally,   Chest wall:    No deformity  Heart:    S1 and S2 normal, no murmur, CVP - no.  Pressors - no  Abdomen:     Soft, no masses, no organomegaly  Genitalia:    Not done  Rectal:   not done  Extremities:   Extremities- intact     Skin:   Intact in exposed areas .     Neurologic:   Sedation - precedex gtt  -> RASS - -2 . Moves all 4s - yes. CAM-ICU - + delirium . Orientation - itnermittent orientation       LABS  PULMONARY  Recent Labs Lab 01/07/17 2112 01/07/17 2114 01/07/17 2333 01/07/17 2334  HCO3 15.7*  --   --  12.5*  TCO2 17* 16* 14* 13*  O2SAT 76.0  --   --  88.0    CBC  Recent Labs Lab 01/08/17 0335 01/09/17 0941 01/10/17 0217  HGB 12.2 13.9 13.9  HCT 37.0 40.0 41.6  WBC 11.5* 10.9* 7.7  PLT 246 227 233    COAGULATION  Recent Labs Lab 01/11/17 0231  INR 0.93    CARDIAC   Recent Labs Lab 01/08/17 0124  TROPONINI <0.03   No results for input(s): PROBNP in the last 168 hours.   CHEMISTRY  Recent Labs Lab 01/08/17 0335 01/08/17 1452 01/09/17 0941 01/10/17 0217 01/11/17 0231 01/12/17 0407  NA 141 137 138 138 138 137  K 3.9 3.4* 3.7 3.5 3.7 3.2*  CL 113* 112* 110 109 110 106  CO2 19* 18* 21* 21* 20* 21*  GLUCOSE 84 92 94 106* 107* 120*  BUN 9 6 <5* 7 8 7   CREATININE 0.78 0.68 0.54 0.58 0.56 0.60  CALCIUM 8.3* 8.2* 8.9 8.7* 8.7* 8.7*  MG 2.2  --  2.0 1.9 2.0 1.8  PHOS  --   --   --  4.0 3.3 3.8   Estimated Creatinine Clearance: 90.5 mL/min (by C-G formula based on SCr of 0.6 mg/dL).   LIVER  Recent Labs Lab 01/07/17 2107 01/08/17 0335 01/09/17 0941 01/11/17 0231  AST 35 28 46* 27  ALT 22 20 24  24   ALKPHOS 76 66 82 72  BILITOT 0.6 0.8 1.7* 1.0  PROT 7.2 6.2* 7.0 6.8  ALBUMIN 3.8 3.3* 3.8 3.4*  INR  --   --   --  0.93     INFECTIOUS  Recent Labs Lab 01/08/17 0124 01/08/17 0335 01/08/17 1452  LATICACIDVEN 3.3* 3.1* 1.1     ENDOCRINE CBG (last 3)   Recent Labs  01/11/17 1746 01/11/17 2332 01/12/17 0623  GLUCAP 78 108* 112*         IMAGING x48h  - image(s) personally visualized  -   highlighted in bold No results found.     ASSESSMENT and PLAN  Acute encephalopathy Ongoing and not improving but controlled for most parts with multiple agents  Plan Continue precedex gtt + + clonidine scheduled + ativan prn  MDD (major depressive disorder), recurrent severe, without psychosis ([place]) When as able she can take po continue home meds of  seroquel scheduled + cymbalta scheduled + lyrica scheduled  Electrolyte imbalance Replete mag and k  Prolonged Q-T interval on ECG She is at risk for this. Was < 565msec yesterday  Plan ekg 01/13/17   Suicide attempt ([place]) Sitter at bedside Needs psych eval   Drug overdose This admit problem      FAMILY  - Updates: 01/12/2017 --> none at bedside  - Inter-disciplinary family meet or Palliative Care meeting due by:  DAy 7. Current LOS is LOS 4 days  CODE STATUS    Code Status Orders        Start     Ordered   01/08/17 0044  Full code  Continuous     01/08/17 0044    Code Status History    Date Active Date Inactive Code Status Order ID Comments User Context   11/01/2016  4:59 PM 11/05/2016  5:58 PM Full Code [id number]  Ethelene Hal, NP Inpatient   11/01/2016 12:27 AM 11/01/2016  4:33 PM Full Code [id number]  Renita Papa, PA-C ED   08/11/2016  8:47 PM 08/14/2016  5:33 PM Full Code [id number]  Clovis Fredrickson, MD Inpatient   08/10/2016  6:59 PM 08/11/2016  7:33 PM Full Code [id number]  Orlie Dakin, MD ED   08/10/2016  5:16 PM 08/10/2016  6:59 PM Full Code [id number]  Blanchie Dessert, MD  ED   07/21/2016  1:12 AM 07/25/2016  5:11 PM Full Code [id number]  Rozetta Nunnery, NP Inpatient   07/20/2016  4:55 PM 07/21/2016 12:41 AM Full Code [id number]  Delia Heady, PA-C ED        DISPO Keep in ICU       The patient is critically ill with multiple organ systems failure and requires high complexity decision making for assessment and support, frequent evaluation and titration of therapies, application of advanced monitoring technologies and extensive interpretation of multiple databases.   Critical Care Time devoted to patient care services described in this note is  30  Minutes. This time reflects time of care of this signee Dr Brand Males. This critical care time does not reflect procedure time, or teaching time or supervisory time of PA/NP/Med student/Med Resident etc but could involve care discussion time    Dr. Brand Males, M.D., [facility].C.P Pulmonary and Critical Care Medicine Staff Physician [facility] Pager: [phone], If no answer or between  15:00h - 7:00h: call [phone]  01/12/2017 8:48 AM

## 2017-01-12 NOTE — Progress Notes (Signed)
eLink Physician-Brief Progress Note Patient Name: Laura [name] DOB: [birth year] MRN: [id number]   Date of Service  01/12/2017  HPI/Events of Note  Request to renew bilateral soft wrist and ankle restraints.   eICU Interventions  Will renew bilateral soft wrist and ankle restraints.      Intervention Category Minor Interventions: Agitation / anxiety - evaluation and management  Kenzo Ozment Eugene 01/12/2017, 11:19 PM

## 2017-01-12 NOTE — Assessment & Plan Note (Signed)
Replete mag and k

## 2017-01-12 NOTE — Assessment & Plan Note (Signed)
This admit problem

## 2017-01-12 NOTE — Assessment & Plan Note (Addendum)
Appears to be resolving. Now off precedex 1 h  Plan Monitor off  precedex gtt  For 01/13/2017 continue clonidine scheduled + ativan prn; but slowly wean off

## 2017-01-12 NOTE — Assessment & Plan Note (Addendum)
She is at risk for this due to medications. QTc 450s 01/13/2017 . Mag is 1.9 01/13/2017   Plan Intermittent Qtc monitoring Give 1g Mag sulfate to get Mag to  > 2g

## 2017-01-12 NOTE — Progress Notes (Signed)
[facility] ADULT ICU REPLACEMENT PROTOCOL FOR AM LAB REPLACEMENT ONLY  The patient does apply for the [facility] Adult ICU Electrolyte Replacment Protocol based on the criteria listed below:   1. Is GFR >/= 40 ml/min? Yes.    Patient's GFR today is .>60 2. Is urine output >/= 0.5 ml/kg/hr for the last 6 hours? Yes.   Patient's UOP is 1.04 ml/kg/hr 3. Is BUN < 60 mg/dL? Yes.    Patient's BUN today is 7 4. Abnormal electrolyte(s): 3.2 5. Ordered repletion with: perprotocol 6. If a panic level lab has been reported, has the CCM MD in charge been notified? Yes.  .   Physician:  Dr. Harlene Ramus, Philis Nettle 01/12/2017 5:43 AM

## 2017-01-12 NOTE — Assessment & Plan Note (Addendum)
Now taking PO so reinitaited home meds of roquel scheduled + cymbalta scheduled + lyrica scheduled

## 2017-01-13 DIAGNOSIS — T43592A Poisoning by other antipsychotics and neuroleptics, intentional self-harm, initial encounter: Principal | ICD-10-CM

## 2017-01-13 DIAGNOSIS — F419 Anxiety disorder, unspecified: Secondary | ICD-10-CM

## 2017-01-13 DIAGNOSIS — R450 Nervousness: Secondary | ICD-10-CM

## 2017-01-13 DIAGNOSIS — T510X2A Toxic effect of ethanol, intentional self-harm, initial encounter: Secondary | ICD-10-CM

## 2017-01-13 DIAGNOSIS — T1491XA Suicide attempt, initial encounter: Secondary | ICD-10-CM

## 2017-01-13 DIAGNOSIS — F17210 Nicotine dependence, cigarettes, uncomplicated: Secondary | ICD-10-CM

## 2017-01-13 DIAGNOSIS — T450X2A Poisoning by antiallergic and antiemetic drugs, intentional self-harm, initial encounter: Secondary | ICD-10-CM

## 2017-01-13 LAB — BASIC METABOLIC PANEL
Anion gap: 7 (ref 5–15)
BUN: 8 mg/dL (ref 6–20)
CHLORIDE: 107 mmol/L (ref 101–111)
CO2: 25 mmol/L (ref 22–32)
CREATININE: 0.67 mg/dL (ref 0.44–1.00)
Calcium: 8.9 mg/dL (ref 8.9–10.3)
GFR calc Af Amer: >60 mL/min (ref >60)
GFR calc non Af Amer: >60 mL/min (ref >60)
Glucose, Bld: 112 mg/dL — ABNORMAL HIGH (ref 65–99)
POTASSIUM: 4.6 mmol/L (ref 3.5–5.1)
Sodium: 139 mmol/L (ref 135–145)

## 2017-01-13 LAB — GLUCOSE, CAPILLARY: Glucose-Capillary: 101 mg/dL — ABNORMAL HIGH (ref 65–99)

## 2017-01-13 LAB — PHOSPHORUS: Phosphorus: 5 mg/dL — ABNORMAL HIGH (ref 2.5–4.6)

## 2017-01-13 LAB — MAGNESIUM: Magnesium: 1.9 mg/dL (ref 1.7–2.4)

## 2017-01-13 MED ORDER — MAGNESIUM SULFATE IN D5W 1-5 GM/100ML-% IV SOLN
1.0000 g | Freq: Once | INTRAVENOUS | Status: AC
  Administered 2017-01-13: 1 g via INTRAVENOUS
  Filled 2017-01-13: qty 100

## 2017-01-13 NOTE — Consult Note (Signed)
[place] [facility]iat[facility] Consult   Reason for Consult:  Suicide attempt Referring Physician:  Franne Forts Patient Identification: Laura [name] MRN:  [id number] Principal Diagnosis: Suicide attempt [facility]) Diagnosis:   Patient Active Problem List   Diagnosis Date Noted  . Acute encephalopathy [G93.40] 01/08/2017  . Drug overdose [T50.901A] 01/08/2017  . PTSD (post-traumatic stress disorder) [F43.10] 08/12/2016  . Major depressive disorder, recurrent severe without psychotic features ([place]) [F33.2] 08/11/2016  . Alcohol intoxication in active alcoholic without complication ([place]) [G89.169]   . Suicide attempt ([place]) [T14.91XA]   . Alcohol use disorder, severe, dependence ([place]) [F10.20] 07/23/2016  . 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 Electrolyte imbalance [E87.8] 07/23/2016  . Prolonged Q-T interval on ECG [R94.31] 07/23/2016  . MDD (major depressive disorder), recurrent severe, without psychosis ([place]) [F33.2] 07/21/2016  . Osteoarthritis of spine with radiculopathy, cervical region [facility] 07/02/2016  . Substance abuse ([place]) [F19.10] 07/02/2016  . Lumbar spondylosis [M47.816] 07/02/2016    Total Time spent with patient: 1 hour  Subjective/ HPI Encarnacion Bole is a 46 y.o. female  with history of depression and substance abuse and alcohol abuse was brought to the ER after patient had intentionally overdosed with drugs.  Patient prior to taking the drugs had called her friends that she is about to take the medications with suicidal ideation.  Patient's parents came to check on the patient and was found on the floor and EMS was called..    Patient give history of recurrent depression. Has been relocated from [place]. Living with Father, recently lost her 31 years old son by drowning, says has been feeling low depressed. Complicated with alcohol use.  On evaluation currently is being stabilized  medically but remains depressed, crying spells, guilt and going thru grief.  She does not remember much how she ended up here possible due to blackout and alcohol intoxication complicated with overdose  altough she is communicating and has reasonable comprehension but still at risk due to depression and recent events  Has been on seroquel tid , lyrica and cymbalta.  States has had back surgeries in the past and suffers from chronic back pain Minimizes her alcohol use but has gone thru rehab before   Denies paranoia Denies clear manic symptoms History of impulsivity  Past Psychiatric History: Depression  Risk to Self: Is patient at risk for suicide?: Yes Risk to Others:   Prior Inpatient Therapy:   Prior Outpatient Therapy:    Past Medical History:  Past Medical History:  Diagnosis Date  . Anxiety   . Depression   . ETOH abuse   . Hepatitis C   . Substance abuse [facility])     Past Surgical History:  Procedure Laterality Date  . BACK SURGERY    . KNEE SURGERY     Family History:  Family History  Problem Relation Age of Onset  . Family history unknown: Yes   Family Psychiatric  History: unknown Social History:  History  Alcohol Use  . Yes    Comment: 3-4 pints a day     History  Drug Use No    Comment: denies     Social History   Social History  . Marital status: Single    Spouse name: N/A  . Number of children: N/A  . Years of education: N/A   Social History Main Topics  . Smoking status: Current Every Day Smoker    Packs/day: 1.00  . Smokeless tobacco: Never  Used  . Alcohol use Yes     Comment: 3-4 pints a day  . Drug use: No     Comment: denies   . Sexual activity: No   Other Topics Concern  . None   Social History Narrative  . None   Additional Social History:    Allergies:  No Known Allergies  Labs:  Results for orders placed or performed during the hospital encounter of 01/07/17 (from the past 48 hour(s))  Glucose, capillary     Status:  None   Collection Time: 01/11/17 11:21 AM  Result Value Ref Range   Glucose-Capillary 92 65 - 99 mg/dL   Comment 1 Capillary Specimen    Comment 2 Notify RN   Glucose, capillary     Status: None   Collection Time: 01/11/17  5:46 PM  Result Value Ref Range   Glucose-Capillary 78 65 - 99 mg/dL   Comment 1 Capillary Specimen    Comment 2 Notify RN   Glucose, capillary     Status: Abnormal   Collection Time: 01/11/17 11:32 PM  Result Value Ref Range   Glucose-Capillary 108 (H) 65 - 99 mg/dL   Comment 1 Capillary Specimen    Comment 2 Notify RN   Magnesium     Status: None   Collection Time: 01/12/17  4:07 AM  Result Value Ref Range   Magnesium 1.8 1.7 - 2.4 mg/dL  Phosphorus     Status: None   Collection Time: 01/12/17  4:07 AM  Result Value Ref Range   Phosphorus 3.8 2.5 - 4.6 mg/dL  Basic metabolic panel     Status: Abnormal   Collection Time: 01/12/17  4:07 AM  Result Value Ref Range   Sodium 137 135 - 145 mmol/L   Potassium 3.2 (L) 3.5 - 5.1 mmol/L   Chloride 106 101 - 111 mmol/L   CO2 21 (L) 22 - 32 mmol/L   Glucose, Bld 120 (H) 65 - 99 mg/dL   BUN 7 6 - 20 mg/dL   Creatinine, Ser 0.60 0.44 - 1.00 mg/dL   Calcium 8.7 (L) 8.9 - 10.3 mg/dL   GFR calc non Af Amer >60 >60 mL/min   GFR calc Af Amer >60 >60 mL/min    Comment: (NOTE) The eGFR has been calculated using the CKD EPI equation. This calculation has not been validated in all clinical situations. eGFR's persistently <60 mL/min signify possible Chronic Kidney Disease.    Anion gap 10 5 - 15  Glucose, capillary     Status: Abnormal   Collection Time: 01/12/17  6:23 AM  Result Value Ref Range   Glucose-Capillary 112 (H) 65 - 99 mg/dL   Comment 1 Capillary Specimen    Comment 2 Notify RN   Glucose, capillary     Status: Abnormal   Collection Time: 01/12/17 12:05 PM  Result Value Ref Range   Glucose-Capillary 113 (H) 65 - 99 mg/dL   Comment 1 Capillary Specimen    Comment 2 Notify RN   Glucose, capillary      Status: Abnormal   Collection Time: 01/12/17  6:13 PM  Result Value Ref Range   Glucose-Capillary 131 (H) 65 - 99 mg/dL   Comment 1 Notify RN   Glucose, capillary     Status: Abnormal   Collection Time: 01/13/17 12:30 AM  Result Value Ref Range   Glucose-Capillary 101 (H) 65 - 99 mg/dL   Comment 1 Notify RN   Magnesium     Status: None  Collection Time: 01/13/17  2:06 AM  Result Value Ref Range   Magnesium 1.9 1.7 - 2.4 mg/dL  Phosphorus     Status: Abnormal   Collection Time: 01/13/17  2:06 AM  Result Value Ref Range   Phosphorus 5.0 (H) 2.5 - 4.6 mg/dL  Basic metabolic panel     Status: Abnormal   Collection Time: 01/13/17  2:06 AM  Result Value Ref Range   Sodium 139 135 - 145 mmol/L   Potassium 4.6 3.5 - 5.1 mmol/L    Comment: DELTA CHECK NOTED   Chloride 107 101 - 111 mmol/L   CO2 25 22 - 32 mmol/L   Glucose, Bld 112 (H) 65 - 99 mg/dL   BUN 8 6 - 20 mg/dL   Creatinine, Ser 0.67 0.44 - 1.00 mg/dL   Calcium 8.9 8.9 - 10.3 mg/dL   GFR calc non Af Amer >60 >60 mL/min   GFR calc Af Amer >60 >60 mL/min    Comment: (NOTE) The eGFR has been calculated using the CKD EPI equation. This calculation has not been validated in all clinical situations. eGFR's persistently <60 mL/min signify possible Chronic Kidney Disease.    Anion gap 7 5 - 15    Current Facility-Administered Medications  Medication Dose Route Frequency Provider Last Rate Last Dose  . chlorhexidine (PERIDEX) 0.12 % solution 15 mL  15 mL Mouth Rinse BID Waldemar Dickens, MD   15 mL at 01/13/17 1016  . cloNIDine (CATAPRES) tablet 0.1 mg  0.1 mg Oral TID Kathi Ludwig, MD   0.1 mg at 01/13/17 1012  . dexmedetomidine (PRECEDEX) 400 MCG/100ML (4 mcg/mL) infusion  0.4-1.2 mcg/kg/hr Intravenous Titrated Regalado, Belkys A, MD   Stopped at 01/13/17 0800  . DULoxetine (CYMBALTA) DR capsule 30 mg  30 mg Oral Daily Everrett Coombe, MD   30 mg at 01/13/17 1012  . enoxaparin (LOVENOX) injection 40 mg  40 mg Subcutaneous  Q24H Otilio Miu, RPH   40 mg at 01/12/17 1644  . folic acid (FOLVITE) tablet 1 mg  1 mg Oral Daily Leroy Libman, RPH   1 mg at 01/13/17 1012  . haloperidol lactate (HALDOL) injection 5 mg  5 mg Intravenous Q30 min PRN Kathi Ludwig, MD      . haloperidol lactate (HALDOL) injection 5 mg  5 mg Intravenous Q6H PRN Kathi Ludwig, MD      . LORazepam (ATIVAN) injection 0.5-1 mg  0.5-1 mg Intravenous Q2H PRN Jennelle Human B, NP   1 mg at 01/11/17 1411  . LORazepam (ATIVAN) injection 2 mg  2 mg Intravenous Once PRN Jennelle Human B, NP      . magnesium sulfate IVPB 1 g 100 mL  1 g Intravenous Once Brand Males, MD   1 g at 01/13/17 1010  . MEDLINE mouth rinse  15 mL Mouth Rinse q12n4p Waldemar Dickens, MD   15 mL at 01/12/17 1531  . multivitamin with minerals tablet 1 tablet  1 tablet Oral Daily Waldemar Dickens, MD   1 tablet at 01/13/17 1012  . pregabalin (LYRICA) capsule 150 mg  150 mg Oral BID Everrett Coombe, MD   150 mg at 01/13/17 1012  . QUEtiapine (SEROQUEL) tablet 200 mg  200 mg Oral TID Everrett Coombe, MD   200 mg at 01/13/17 1012  . thiamine (VITAMIN B-1) tablet 100 mg  100 mg Oral Daily Waldemar Dickens, MD   100 mg at 01/13/17 1012    Musculoskeletal: Strength & Muscle Tone:  within normal limits with generalized weakness   Psychiatric Specialty Exam: Physical Exam  Review of Systems  Cardiovascular: Negative for chest pain.  Psychiatric/Behavioral: Positive for depression and substance abuse. The patient is nervous/anxious.     Blood pressure 111/76, pulse 87, temperature (!) 97.5 F (36.4 C), temperature source Oral, resp. rate 14, weight 68.5 kg (151 lb), SpO2 100 %.Body mass index is 22.96 kg/m.  General Appearance: Casual  Eye Contact:  Minimal  Speech:  Slow  Volume:  Decreased  Mood:  Dysphoric  Affect:  Constricted  Thought Process:  Goal Directed and Descriptions of Associations: Intact  Orientation:  Full (Time, Place, and Person)  Thought  Content:  Rumination  Suicidal Thoughts:  No  Homicidal Thoughts:  No  Memory:  Immediate;   Fair Recent;   Fair  Judgement:  Poor  Insight:  Shallow  Psychomotor Activity:  Decreased  Concentration:  Concentration: Fair and Attention Span: Fair  Recall:  [employer] of Knowledge:  Fair  Language:  Fair  Akathisia:  Negative  Handed:  Right  AIMS (if indicated):     Assets:  Desire for Improvement Social Support  ADL's:  Intact  Cognition:  WNL  Sleep:        Treatment Plan Summary: Daily contact with patient to assess and evaluate symptoms and progress in treatment, Medication management and Plan as follows  Disposition: Recommend psychiatric Inpatient admission when medically cleared.  She remains depressed but wanting to go home. Had a discussion of her significant attempt and symptoms may worsen Keep under IVC and sitter for now Admit to psychiatry when medically cleared , stable and no iv infusions are needed Call [phone] for beds availability when stable or involve social services for beds availability elsewhere Also recommend to lower seroquel to 137m bid , 2062mqhs. Instead of 20054mid.  Add wellbutrin 100m21m or during am considering her depression   AKHTMerian Capron 01/13/2017 11:04 AM

## 2017-01-13 NOTE — Progress Notes (Signed)
Pt to be transferred to 6N18 via wheelchair, nurse, and NT. Report called to [facility]. VS WNL.

## 2017-01-13 NOTE — Progress Notes (Signed)
PULMONARY / CRITICAL CARE MEDICINE   Name: Laura [name] MRN: [id number] DOB: 09-Oct-1970 PCP Vivi Barrack, MD LOS 5 as of 01/13/2017     ADMISSION DATE:  01/07/2017    BRIEF Laura Huot Beatonis a 46 y.o.femalewith history of depression and substance abuse and alcohol abuse was brought to the ER after patient had intentionally overdosed with drugs. Patient prior to taking the drugs had called her friends that she is about to take the medications with suicidal ideation. Patient's parents came to check on the patient and was found on the floor and EMS was called.  ED Course:In the ER patient is found to be tachycardic confused and had myoclonic jerks. Patient briefly had an episode of tonic-clonic seizure which lasted for 15 seconds and was given Ativan 1 mg IV. The patient was started on fluids. EKG was showing sinus tachycardia with prolonged QTC of 596 ms. Patient was given 2 g of IV magnesium sulfate. Poison control was notified. The likely medications as per the ER physician who provided most of the history, patient could have taken, are Seroquel and Benadryl. Alcohol levels were positive. Acetaminophen level and salicylates levels were negative.  PAST MEDICAL HISTORY :  She  has a past medical history of Anxiety; Depression; ETOH abuse; Hepatitis C; and Substance abuse ([place]).  PAST SURGICAL HISTORY: She  has a past surgical history that includes Back surgery and Knee surgery.   EVENTS 01/07/2017 - admit  01/12/2017 - remains on precedex, scheduled clonidiine + prn haldol + prn ativan   SUBJECTIVE/OVERNIGHT/INTERVAL HX 10/28 - off precedex x 1h and expressing desire to go home Able to give RN  Good history of presentation. RN feels patient is showing oriented behavior and is calm   VITAL SIGNS: BP 101/86   Pulse (!) 56   Temp (!) 97.5 F (36.4 C) (Oral)   Resp 18   Wt 68.5 kg (151 lb)   SpO2 99%   BMI 22.96 kg/m   HEMODYNAMICS:    VENTILATOR  SETTINGS:    INTAKE / OUTPUT: I/O last 3 completed shifts: In: 2766 [P.O.:270; I.V.:2496] Out: 3525 [TIRWE:3154]     EXAM   General Appearance:    Looks much better  Head:    Normocephalic, without obvious abnormality, atraumatic  Eyes:    PERRL - yes, conjunctiva/corneas - clear      Ears:    Normal external ear canals, both ears  Nose:   NG tube - no  Throat:  ETT TUBE - no , OG tube - no  Neck:   Supple,  No enlargement/tenderness/nodules     Lungs:     Clear to auscultation bilaterally,  Chest wall:    No deformity  Heart:    S1 and S2 normal, no murmur, CVP - no.  Pressors - no  Abdomen:     Soft, no masses, no organomegaly  Genitalia:    Not done  Rectal:   not done  Extremities:   Extremities- intact     Skin:   Intact in exposed areas . Sacral area - no decub reported per rN     Neurologic:   Sedation - none -> RASS - +1 . Moves all 4s - yes. CAM-ICU - neg for delirium . Orientation - x3+. Flat affect        LABS  PULMONARY  Recent Labs Lab 01/07/17 2112 01/07/17 2114 01/07/17 2333 01/07/17 2334  HCO3 15.7*  --   --  12.5*  TCO2 17*  16* 14* 13*  O2SAT 76.0  --   --  88.0    CBC  Recent Labs Lab 01/08/17 0335 01/09/17 0941 01/10/17 0217  HGB 12.2 13.9 13.9  HCT 37.0 40.0 41.6  WBC 11.5* 10.9* 7.7  PLT 246 227 233    COAGULATION  Recent Labs Lab 01/11/17 0231  INR 0.93    CARDIAC    Recent Labs Lab 01/08/17 0124  TROPONINI <0.03   No results for input(s): PROBNP in the last 168 hours.   CHEMISTRY  Recent Labs Lab 01/09/17 0941 01/10/17 0217 01/11/17 0231 01/12/17 0407 01/13/17 0206  NA 138 138 138 137 139  K 3.7 3.5 3.7 3.2* 4.6  CL 110 109 110 106 107  CO2 21* 21* 20* 21* 25  GLUCOSE 94 106* 107* 120* 112*  BUN <5* 7 8 7 8   CREATININE 0.54 0.58 0.56 0.60 0.67  CALCIUM 8.9 8.7* 8.7* 8.7* 8.9  MG 2.0 1.9 2.0 1.8 1.9  PHOS  --  4.0 3.3 3.8 5.0*   Estimated Creatinine Clearance: 90.5 mL/min (by C-G formula  based on SCr of 0.67 mg/dL).   LIVER  Recent Labs Lab 01/07/17 2107 01/08/17 0335 01/09/17 0941 01/11/17 0231  AST 35 28 46* 27  ALT 22 20 24 24   ALKPHOS 76 66 82 72  BILITOT 0.6 0.8 1.7* 1.0  PROT 7.2 6.2* 7.0 6.8  ALBUMIN 3.8 3.3* 3.8 3.4*  INR  --   --   --  0.93     INFECTIOUS  Recent Labs Lab 01/08/17 0124 01/08/17 0335 01/08/17 1452  LATICACIDVEN 3.3* 3.1* 1.1     ENDOCRINE CBG (last 3)   Recent Labs  01/12/17 1205 01/12/17 1813 01/13/17 0030  GLUCAP 113* 131* 101*         IMAGING x48h  - image(s) personally visualized  -   highlighted in bold No results found.     ASSESSMENT and PLAN  Acute encephalopathy Appears to be resolving. Now off precedex 1 h  Plan Monitor off  precedex gtt  For 01/13/2017 continue clonidine scheduled + ativan prn; but slowly wean off  MDD (major depressive disorder), recurrent severe, without psychosis ([place]) Now taking PO so reinitaited home meds of roquel scheduled + cymbalta scheduled + lyrica scheduled  Electrolyte imbalance Replete mag and k  Prolonged Q-T interval on ECG She is at risk for this due to medications. QTc 450s 01/13/2017 . Mag is 1.9 01/13/2017   Plan Intermittent Qtc monitoring Give 1g Mag sulfate to get Mag to  > 2g   Suicide attempt [facility]) Asking to sign out AMA to go take care of her 44 year old (and at different times 7 year old) son. She feels she is safe to go  Home and only reason she attempted suicide was because another son died  PLAN IVC Sitter at bedside Needs psych eval - now that she is improved - we will call psych 01/13/2017   Drug overdose This admit problem      FAMILY  - Updates: 01/13/2017 --> none at bedside  - Inter-disciplinary family meet or Palliative Care meeting due by:  DAy 7. Current LOS is LOS 5 days  CODE STATUS    Code Status Orders        Start     Ordered   01/08/17 0044  Full code  Continuous     01/08/17 0044    Code  Status History    Date Active Date Inactive Code Status Order  ID Comments User Context   11/01/2016  4:59 PM 11/05/2016  5:58 PM Full Code [id number]  Ethelene Hal, NP Inpatient   11/01/2016 12:27 AM 11/01/2016  4:33 PM Full Code [id number]  Renita Papa, PA-C ED   08/11/2016  8:47 PM 08/14/2016  5:33 PM Full Code [id number]  Clovis Fredrickson, MD Inpatient   08/10/2016  6:59 PM 08/11/2016  7:33 PM Full Code [id number]  Orlie Dakin, MD ED   08/10/2016  5:16 PM 08/10/2016  6:59 PM Full Code [id number]  Blanchie Dessert, MD ED   07/21/2016  1:12 AM 07/25/2016  5:11 PM Full Code [id number]  Rozetta Nunnery, NP Inpatient   07/20/2016  4:55 PM 07/21/2016 12:41 AM Full Code [id number]  Delia Heady, PA-C ED        DISPO Keep in ICU through PM of 01/13/2017 and reassess for transfer later 01/13/2017       Dr. Brand Males, M.D., F.C.C.P Pulmonary and Critical Care Medicine Staff Physician [facility] Pager: [phone], If no answer or between  15:00h - 7:00h: call [phone]  01/13/2017 8:49 AM

## 2017-01-13 NOTE — Progress Notes (Signed)
Remains off precedex since AM Sitter says no issues other than occ confusion Psych notes noted  Plan Move to med surg Given to [place] for pick up 01/14/17 and ccm off  Dr. Brand Males, M.D., [facility].C.P Pulmonary and Critical Care Medicine Staff Physician [facility] Pager: [phone], If no answer or between  15:00h - 7:00h: call [phone]  01/13/2017 2:49 PM

## 2017-01-14 ENCOUNTER — Encounter (HOSPITAL_COMMUNITY): Payer: Self-pay | Admitting: *Deleted

## 2017-01-14 ENCOUNTER — Inpatient Hospital Stay (HOSPITAL_COMMUNITY)
Admission: AD | Admit: 2017-01-14 | Discharge: 2017-01-17 | DRG: 885 | Disposition: A | Discharge status: Home / Self Care | Payer: Medicare Other | Source: Intra-hospital | Attending: Psychiatry | Admitting: Psychiatry

## 2017-01-14 DIAGNOSIS — F4310 Post-traumatic stress disorder, unspecified: Secondary | ICD-10-CM | POA: Diagnosis present

## 2017-01-14 DIAGNOSIS — Z915 Personal history of self-harm: Secondary | ICD-10-CM | POA: Diagnosis not present

## 2017-01-14 DIAGNOSIS — F1020 Alcohol dependence, uncomplicated: Secondary | ICD-10-CM | POA: Diagnosis not present

## 2017-01-14 DIAGNOSIS — T50902A Poisoning by unspecified drugs, medicaments and biological substances, intentional self-harm, initial encounter: Secondary | ICD-10-CM | POA: Diagnosis not present

## 2017-01-14 DIAGNOSIS — Z79899 Other long term (current) drug therapy: Secondary | ICD-10-CM

## 2017-01-14 DIAGNOSIS — F1410 Cocaine abuse, uncomplicated: Secondary | ICD-10-CM | POA: Diagnosis not present

## 2017-01-14 DIAGNOSIS — G8929 Other chronic pain: Secondary | ICD-10-CM | POA: Diagnosis present

## 2017-01-14 DIAGNOSIS — F329 Major depressive disorder, single episode, unspecified: Secondary | ICD-10-CM | POA: Insufficient documentation

## 2017-01-14 DIAGNOSIS — M545 Low back pain: Secondary | ICD-10-CM | POA: Diagnosis present

## 2017-01-14 DIAGNOSIS — F1010 Alcohol abuse, uncomplicated: Secondary | ICD-10-CM | POA: Diagnosis present

## 2017-01-14 DIAGNOSIS — F332 Major depressive disorder, recurrent severe without psychotic features: Secondary | ICD-10-CM | POA: Diagnosis present

## 2017-01-14 DIAGNOSIS — R569 Unspecified convulsions: Secondary | ICD-10-CM | POA: Diagnosis not present

## 2017-01-14 DIAGNOSIS — F419 Anxiety disorder, unspecified: Secondary | ICD-10-CM | POA: Diagnosis present

## 2017-01-14 DIAGNOSIS — F1110 Opioid abuse, uncomplicated: Secondary | ICD-10-CM | POA: Diagnosis not present

## 2017-01-14 DIAGNOSIS — Z8619 Personal history of other infectious and parasitic diseases: Secondary | ICD-10-CM

## 2017-01-14 DIAGNOSIS — R450 Nervousness: Secondary | ICD-10-CM | POA: Diagnosis not present

## 2017-01-14 DIAGNOSIS — G9340 Encephalopathy, unspecified: Secondary | ICD-10-CM | POA: Diagnosis not present

## 2017-01-14 DIAGNOSIS — Z8614 Personal history of Methicillin resistant Staphylococcus aureus infection: Secondary | ICD-10-CM

## 2017-01-14 DIAGNOSIS — Z818 Family history of other mental and behavioral disorders: Secondary | ICD-10-CM | POA: Diagnosis not present

## 2017-01-14 DIAGNOSIS — F17210 Nicotine dependence, cigarettes, uncomplicated: Secondary | ICD-10-CM | POA: Diagnosis present

## 2017-01-14 DIAGNOSIS — R9431 Abnormal electrocardiogram [ECG] [EKG]: Secondary | ICD-10-CM | POA: Diagnosis not present

## 2017-01-14 DIAGNOSIS — T1491XA Suicide attempt, initial encounter: Secondary | ICD-10-CM | POA: Diagnosis not present

## 2017-01-14 DIAGNOSIS — G4700 Insomnia, unspecified: Secondary | ICD-10-CM | POA: Diagnosis not present

## 2017-01-14 DIAGNOSIS — F339 Major depressive disorder, recurrent, unspecified: Secondary | ICD-10-CM | POA: Diagnosis not present

## 2017-01-14 DIAGNOSIS — F39 Unspecified mood [affective] disorder: Secondary | ICD-10-CM | POA: Diagnosis not present

## 2017-01-14 LAB — BASIC METABOLIC PANEL
ANION GAP: 7 (ref 5–15)
BUN: 11 mg/dL (ref 6–20)
CO2: 23 mmol/L (ref 22–32)
CREATININE: 0.7 mg/dL (ref 0.44–1.00)
Calcium: 9 mg/dL (ref 8.9–10.3)
Chloride: 107 mmol/L (ref 101–111)
GFR calc Af Amer: >60 mL/min (ref >60)
GFR calc non Af Amer: >60 mL/min (ref >60)
Glucose, Bld: 91 mg/dL (ref 65–99)
POTASSIUM: 3.9 mmol/L (ref 3.5–5.1)
Sodium: 137 mmol/L (ref 135–145)

## 2017-01-14 LAB — PHOSPHORUS: Phosphorus: 4.3 mg/dL (ref 2.5–4.6)

## 2017-01-14 LAB — CBC WITH DIFFERENTIAL/PLATELET
Basophils Absolute: 0 10*3/uL (ref 0.0–0.1)
Basophils Relative: 0 %
Eosinophils Absolute: 0.4 10*3/uL (ref 0.0–0.7)
Eosinophils Relative: 4 %
HEMATOCRIT: 41.2 % (ref 36.0–46.0)
HEMOGLOBIN: 13.7 g/dL (ref 12.0–15.0)
LYMPHS PCT: 51 %
Lymphs Abs: 4.4 10*3/uL — ABNORMAL HIGH (ref 0.7–4.0)
MCH: 31.2 pg (ref 26.0–34.0)
MCHC: 33.3 g/dL (ref 30.0–36.0)
MCV: 93.8 fL (ref 78.0–100.0)
MONOS PCT: 10 %
Monocytes Absolute: 0.8 10*3/uL (ref 0.1–1.0)
NEUTROS PCT: 35 %
Neutro Abs: 3 10*3/uL (ref 1.7–7.7)
Platelets: 283 10*3/uL (ref 150–400)
RBC: 4.39 MIL/uL (ref 3.87–5.11)
RDW: 14.1 % (ref 11.5–15.5)
WBC: 8.6 10*3/uL (ref 4.0–10.5)

## 2017-01-14 LAB — MAGNESIUM: Magnesium: 1.9 mg/dL (ref 1.7–2.4)

## 2017-01-14 LAB — PREGNANCY, URINE: PREG TEST UR: NEGATIVE

## 2017-01-14 MED ORDER — QUETIAPINE FUMARATE 100 MG PO TABS: 100.0000 mg | ORAL_TABLET | Freq: Two times a day (BID) | ORAL | Status: DC

## 2017-01-14 MED ORDER — ACETAMINOPHEN 325 MG PO TABS
650.0000 mg | ORAL_TABLET | Freq: Four times a day (QID) | ORAL | Status: DC | PRN
  Administered 2017-01-14: 650 mg via ORAL
  Filled 2017-01-14: qty 2

## 2017-01-14 MED ORDER — ALUM & MAG HYDROXIDE-SIMETH 200-200-20 MG/5ML PO SUSP: 30.0000 mL | ORAL | Status: DC | PRN

## 2017-01-14 MED ORDER — TRAZODONE HCL 50 MG PO TABS
50.0000 mg | ORAL_TABLET | Freq: Every evening | ORAL | Status: DC | PRN
  Administered 2017-01-14: 50 mg via ORAL
  Filled 2017-01-14 (×7): qty 1

## 2017-01-14 MED ORDER — MAGNESIUM HYDROXIDE 400 MG/5ML PO SUSP: 30.0000 mL | Freq: Every day | ORAL | Status: DC | PRN

## 2017-01-14 MED ORDER — THIAMINE HCL 100 MG PO TABS: 100.0000 mg | ORAL_TABLET | Freq: Every day | ORAL | 0 refills | Status: DC

## 2017-01-14 MED ORDER — FOLIC ACID 1 MG PO TABS: 1.0000 mg | ORAL_TABLET | Freq: Every day | ORAL | 0 refills | Status: DC

## 2017-01-14 MED ORDER — HYDROXYZINE HCL 25 MG PO TABS
25.0000 mg | ORAL_TABLET | Freq: Three times a day (TID) | ORAL | Status: DC | PRN
  Administered 2017-01-14 – 2017-01-15 (×2): 25 mg via ORAL
  Filled 2017-01-14 (×2): qty 1

## 2017-01-14 MED ORDER — HYDROXYZINE HCL 50 MG PO TABS
50.0000 mg | ORAL_TABLET | Freq: Every evening | ORAL | Status: DC | PRN
  Administered 2017-01-14 – 2017-01-16 (×4): 50 mg via ORAL
  Filled 2017-01-14 (×9): qty 1

## 2017-01-14 MED ORDER — NICOTINE 21 MG/24HR TD PT24
21.0000 mg | MEDICATED_PATCH | Freq: Every day | TRANSDERMAL | Status: DC
  Administered 2017-01-14: 21 mg via TRANSDERMAL
  Filled 2017-01-14 (×3): qty 1

## 2017-01-14 MED ORDER — QUETIAPINE FUMARATE 100 MG PO TABS: 200.0000 mg | ORAL_TABLET | Freq: Every day | ORAL | Status: DC

## 2017-01-14 NOTE — Plan of Care (Signed)
Problem: Role Relationship: Goal: Ability to demonstrate positive changes in social behaviors and relationships will improve Outcome: Progressing Nurse discussed anxiety/depression with patient.

## 2017-01-14 NOTE — Progress Notes (Signed)
Patient was given wheelchair because of her bilateral leg weakness during admission.  Patient has been encouraged numerous times to stay in the wheelchair to prevent a fall.  Staff informed to watch patient to make sure she is ambulating in wheelchair.  Patient continues to ask for seroquel.  Note in chart stated that patient overdosed on seroquel.  Explained to patient that she has medication for anxiety, pain, and sleep tonight.  Encouraged patient to discuss her medications with MD.  Patient given vistaril and tylenol after dinner.  Respirations even and unlabored.  No signs/symptoms of pain/distress noted on patient's face/body movements.

## 2017-01-14 NOTE — BHH Group Notes (Signed)
Adult Psychoeducational Group Note  Date:  01/14/2017 Time:  11:58 PM  Group Topic/Focus:  Wrap-Up Group:   The focus of this group is to help patients review their daily goal of treatment and discuss progress on daily workbooks.  Participation Level:  Minimal  Participation Quality:  Appropriate  Affect:  Depressed and Flat  Cognitive:  Alert and Appropriate  Insight: Limited  Engagement in Group:  Lacking and Poor  Modes of Intervention:  Discussion  Additional Comments:  Pt attended and participate din group. Pt rated their day a 2/10 due to the fact that their son died 60mos ago. Pt. goal is to be discharged. Their were no positives noted by the pt.   Cristi Loron 01/14/2017, 11:58 PM

## 2017-01-14 NOTE — Progress Notes (Signed)
Patient asking about belongings including a wallet and purse she came in with. I called security, they don't have anything down there for her. Her clothes were returned to her and the seroquel (home med) she came in with was returned to her but given to [employer] to transfer with her to [facility].

## 2017-01-14 NOTE — Social Work (Addendum)
CSW was advised that patient is ready for transition to inpatient psych.  CSW called [facility] charge, Otila Kluver, [phone]) and she indicated she will review and let CSW know if they have a bed.  CSW will await f/u.  11:11am-CSW received a call from Otila Kluver advising that they will have a bed today. CSW faxed tina IVC paperwork on chart.   CSW met with patient and she indicated that she would be going home, however she is IVC'd for psych care.  CSW will continue to follow for transition to [facility].  Elissa Hefty, LCSW Clinical Social Worker [phone]

## 2017-01-14 NOTE — Discharge Summary (Signed)
Physician Discharge Summary  Laura [name] OFB:[id number] DOB: 1971/03/04 DOA: 01/07/2017  PCP: Vivi Barrack, MD  Admit date: 01/07/2017 Discharge date: 01/14/2017  Admitted From: home Disposition:  [facility] once bed is available  Recommendations for Outpatient Follow-up:  1. Follow up with PCP in 1week 2. Follow-up with psychiatrist at [facility] at earliest [place]: No  Equipment/Devices: None  Discharge Condition: Stable  CODE STATUS: Full  Diet recommendation: Heart Healthy  Brief/Interim Summary:  46 year old female with history of depression, substance abuse and alcohol abuse presented on 01/07/2017 for intentional drug overdose. She was initially tachycardic, confused with myoclonic jerks. Patient had to be put on Precedex and was under the care of CCM team and care was transferred to hospitalist team on 01/14/2017. Psychiatry has evaluated the patient and recommends admission to [facility]. Patient is medically stable to discharge to psychiatric facility once bed is available. Dose of Seroquel to be adjusted by the psychiatrist.  Discharge Diagnoses:  Principal Problem:   Suicide attempt [facility]) Active Problems:   Substance abuse ([place])   MDD (major depressive disorder), recurrent severe, without psychosis ([place])   Alcohol use disorder, severe, dependence ([place])   Electrolyte imbalance   Prolonged Q-T interval on ECG   Acute encephalopathy   Drug overdose  Acute encephalopathy from intentional drug overdose  - encephalopathy has resolved. Patient is off Precedex.  Intentional drug overdose with suicidal ideation - psychiatry following. Discharged to psychiatric hospital once bed is available. Dose of Seroquel and other psychiatric medications to be adjusted by the psychiatrist. - continue sitter at bedside  Brief episode of seizures - likely due to drug overdose. Resolved. No need for antiepileptics.  History of substance abuse and alcohol  abuse - psychiatry following. Social worker consult. Continue thiamine and folate  QTc prolongation - improved. Try and avoid QTC prolonging agents  Discharge Instructions  Discharge Instructions    Call MD for:  difficulty breathing, headache or visual disturbances    Complete by:  As directed    Call MD for:  extreme fatigue    Complete by:  As directed    Call MD for:  hives    Complete by:  As directed    Call MD for:  persistant dizziness or light-headedness    Complete by:  As directed    Call MD for:  persistant nausea and vomiting    Complete by:  As directed    Call MD for:  severe uncontrolled pain    Complete by:  As directed    Call MD for:  temperature >100.4    Complete by:  As directed    Diet - low sodium heart healthy    Complete by:  As directed    Increase activity slowly    Complete by:  As directed      Allergies as of 01/14/2017   No Known Allergies     Medication List    STOP taking these medications   HYDROcodone-acetaminophen 5-325 MG tablet Commonly known as:  NORCO/VICODIN   QUEtiapine 400 MG tablet Commonly known as:  SEROQUEL     TAKE these medications   DULoxetine 30 MG capsule Commonly known as:  CYMBALTA Take 30 mg by mouth daily.   folic acid 1 MG tablet Commonly known as:  FOLVITE Take 1 tablet (1 mg total) by mouth daily.   pregabalin 150 MG capsule Commonly known as:  LYRICA Take 1 capsule (150 mg total) by mouth 2 (two)  times daily.   thiamine 100 MG tablet Take 1 tablet (100 mg total) by mouth daily.      Follow-up Information    Vivi Barrack, MD. Schedule an appointment as soon as possible for a visit in 1 week(s).   Specialty:  Family Medicine Contact information: [address] [place] [address] [phone]          No Known Allergies  Consultations:  CCM  Psychiatry   Procedures/Studies: Dg Chest Portable 1 View  Result Date: 01/07/2017 CLINICAL DATA:  46 year old female with  overdose. EXAM: PORTABLE CHEST 1 VIEW COMPARISON:  None. FINDINGS: The heart size and mediastinal contours are within normal limits. Both lungs are clear. The visualized skeletal structures are unremarkable. IMPRESSION: No active disease. Electronically Signed   By: Anner Crete M.D.   On: 01/07/2017 22:49       Subjective: Patient seen and examined at bedside. She denies any overnight fever, nausea or vomiting. She was to go home.  Discharge Exam: Vitals:   01/14/17 0000 01/14/17 0328  BP: 95/74 98/62  Pulse: 77 74  Resp: 16 16  Temp: 98.2 F (36.8 C) 97.9 F (36.6 C)  SpO2: 97% 99%   Vitals:   01/13/17 1613 01/13/17 1934 01/14/17 0000 01/14/17 0328  BP: 113/75 103/71 95/74 98/62   Pulse: 77 (!) 104 77 74  Resp: 16 18 16 16   Temp: 97.6 F (36.4 C) 98.3 F (36.8 C) 98.2 F (36.8 C) 97.9 F (36.6 C)  TempSrc: Oral Oral Oral Oral  SpO2: 99% 99% 97% 99%  Weight:        General: Pt is alert, awake, not in acute distress Cardiovascular: rate controlled, S1/S2 +, no rubs, no gallops Respiratory: bilateral decreased breath sounds at bases Abdominal: Soft, NT, ND, bowel sounds + Extremities: no edema, no cyanosis    The results of significant diagnostics from this hospitalization (including imaging, microbiology, ancillary and laboratory) are listed below for reference.     Microbiology: Recent Results (from the past 240 hour(s))  MRSA PCR Screening     Status: None   Collection Time: 01/08/17  5:03 PM  Result Value Ref Range Status   MRSA by PCR NEGATIVE NEGATIVE Final    Comment:        The GeneXpert MRSA Assay (FDA approved for NASAL specimens only), is one component of a comprehensive MRSA colonization surveillance program. It is not intended to diagnose MRSA infection nor to guide or monitor treatment for MRSA infections.      Labs: BNP (last 3 results) No results for input(s): BNP in the last 8760 hours. Basic Metabolic Panel:  Recent Labs Lab  01/10/17 0217 01/11/17 0231 01/12/17 0407 01/13/17 0206 01/14/17 0438  NA 138 138 137 139 137  K 3.5 3.7 3.2* 4.6 3.9  CL 109 110 106 107 107  CO2 21* 20* 21* 25 23  GLUCOSE 106* 107* 120* 112* 91  BUN 7 8 7 8 11   CREATININE 0.58 0.56 0.60 0.67 0.70  CALCIUM 8.7* 8.7* 8.7* 8.9 9.0  MG 1.9 2.0 1.8 1.9 1.9  PHOS 4.0 3.3 3.8 5.0* 4.3   Liver Function Tests:  Recent Labs Lab 01/07/17 2107 01/08/17 0335 01/09/17 0941 01/11/17 0231  AST 35 28 46* 27  ALT 22 20 24 24   ALKPHOS 76 66 82 72  BILITOT 0.6 0.8 1.7* 1.0  PROT 7.2 6.2* 7.0 6.8  ALBUMIN 3.8 3.3* 3.8 3.4*    Recent Labs Lab 01/07/17 2107  LIPASE 18  Recent Labs Lab 01/09/17 0941  AMMONIA 28   CBC:  Recent Labs Lab 01/07/17 2107  01/07/17 2333 01/08/17 0335 01/09/17 0941 01/10/17 0217 01/14/17 0438  WBC 12.0*  --   --  11.5* 10.9* 7.7 8.6  NEUTROABS 8.6*  --   --   --   --   --  3.0  HGB 15.3*  < > 13.3 12.2 13.9 13.9 13.7  HCT 45.3  < > 39.0 37.0 40.0 41.6 41.2  MCV 94.6  --   --  93.2 92.8 93.3 93.8  PLT 256  --   --  246 227 233 283  < > = values in this interval not displayed. Cardiac Enzymes:  Recent Labs Lab 01/08/17 0124 01/08/17 1452 01/09/17 0941  CKTOTAL 119 364* 260*  TROPONINI <0.03  --   --    BNP: Invalid input(s): POCBNP CBG:  Recent Labs Lab 01/11/17 2332 01/12/17 0623 01/12/17 1205 01/12/17 1813 01/13/17 0030  GLUCAP 108* 112* 113* 131* 101*   D-Dimer No results for input(s): DDIMER in the last 72 hours. Hgb A1c No results for input(s): HGBA1C in the last 72 hours. Lipid Profile No results for input(s): CHOL, HDL, LDLCALC, TRIG, CHOLHDL, LDLDIRECT in the last 72 hours. Thyroid function studies No results for input(s): TSH, T4TOTAL, T3FREE, THYROIDAB in the last 72 hours.  Invalid input(s): FREET3 Anemia work up No results for input(s): VITAMINB12, FOLATE, FERRITIN, TIBC, IRON, RETICCTPCT in the last 72 hours. Urinalysis    Component Value Date/Time    BILIRUBINUR Negative 12/17/2016 1348   PROTEINUR Negative 12/17/2016 1348   UROBILINOGEN 0.2 12/17/2016 1348   NITRITE Negative 12/17/2016 1348   LEUKOCYTESUR Negative 12/17/2016 1348   Sepsis Labs Invalid input(s): PROCALCITONIN,  WBC,  LACTICIDVEN Microbiology Recent Results (from the past 240 hour(s))  MRSA PCR Screening     Status: None   Collection Time: 01/08/17  5:03 PM  Result Value Ref Range Status   MRSA by PCR NEGATIVE NEGATIVE Final    Comment:        The GeneXpert MRSA Assay (FDA approved for NASAL specimens only), is one component of a comprehensive MRSA colonization surveillance program. It is not intended to diagnose MRSA infection nor to guide or monitor treatment for MRSA infections.      Time coordinating discharge: 35 minutes  SIGNED:   Aline August, MD  [facility] 01/14/2017, 10:49 AM Pager: [phone]  If 7PM-7AM, please contact night-coverage [email or web address] Password [email or web address]

## 2017-01-14 NOTE — Progress Notes (Signed)
Patient transferred to [facility] via [facility]. Report given to behavioral health nurse. Patient was given all of her belongings prior to discharge.

## 2017-01-14 NOTE — Progress Notes (Signed)
Patient ID: Laura [name], female   DOB: 24-Aug-1970, 46 y.o.   MRN: [id number]  PER STATE REGULATIONS 482.30  THIS CHART WAS REVIEWED FOR MEDICAL NECESSITY WITH RESPECT TO THE PATIENT'S ADMISSION/DURATION OF STAY.  NEXT REVIEW DATE: 01/18/17  Roma Schanz, RN, BSN CASE MANAGER

## 2017-01-14 NOTE — Social Work (Addendum)
[facility] charge, Otila Kluver,  indicated that patient will go to [facility] 302 Bed 2. Dr. Parke Poisson has accepted patient and will provide care.   CSW arranged ambulance transport via [employer] .  RN to call [phone] to give report prior to discharge.  Clinical Social Worker will sign off for now as social work intervention is no longer needed. Please consult [place] again if new need arises.  Elissa Hefty, LCSW Clinical Social Worker [phone]

## 2017-01-14 NOTE — Tx Team (Signed)
Initial Treatment Plan 01/14/2017 5:05 PM Honestie Kulik AJO:[id number]    PATIENT STRESSORS: Financial difficulties Health problems Legal issue Loss of son Roderic Palau Occupational concerns Substance abuse Traumatic event   PATIENT STRENGTHS: Ability for insight Average or above average intelligence Capable of independent living Communication skills General fund of knowledge Motivation for treatment/growth Physical Health Supportive family/friends Work skills   PATIENT IDENTIFIED PROBLEMS: "depression"  "anxiety"  "suicide thoughts"  "substance abuse"               DISCHARGE CRITERIA:  Ability to meet basic life and health needs Improved stabilization in mood, thinking, and/or behavior Medical problems require only outpatient monitoring Motivation to continue treatment in a less acute level of care Need for constant or close observation no longer present Reduction of life-threatening or endangering symptoms to within safe limits Safe-care adequate arrangements made Verbal commitment to aftercare and medication compliance Withdrawal symptoms are absent or subacute and managed without 24-hour nursing intervention  PRELIMINARY DISCHARGE PLAN: Attend aftercare/continuing care group Attend PHP/IOP Attend 12-step recovery group Outpatient therapy Participate in family therapy Return to previous living arrangement  PATIENT/FAMILY INVOLVEMENT: This treatment plan has been presented to and reviewed with the patient, Nihira Puello.  The patient and family have been given the opportunity to ask questions and make suggestions.  Grayland Ormond [place], RN 01/14/2017, 5:05 PM
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
## 2017-01-15 DIAGNOSIS — Z915 Personal history of self-harm: Secondary | ICD-10-CM

## 2017-01-15 DIAGNOSIS — F339 Major depressive disorder, recurrent, unspecified: Secondary | ICD-10-CM

## 2017-01-15 DIAGNOSIS — Z818 Family history of other mental and behavioral disorders: Secondary | ICD-10-CM

## 2017-01-15 MED ORDER — FLUOXETINE HCL 10 MG PO CAPS
10.0000 mg | ORAL_CAPSULE | Freq: Every day | ORAL | Status: DC
  Administered 2017-01-15 – 2017-01-17 (×3): 10 mg via ORAL
  Filled 2017-01-15 (×5): qty 1

## 2017-01-15 MED ORDER — ENSURE ENLIVE PO LIQD
237.0000 mL | Freq: Two times a day (BID) | ORAL | Status: DC
  Administered 2017-01-15: 237 mL via ORAL

## 2017-01-15 MED ORDER — TRAZODONE HCL 100 MG PO TABS
100.0000 mg | ORAL_TABLET | Freq: Every evening | ORAL | Status: DC | PRN
  Administered 2017-01-15 – 2017-01-16 (×2): 100 mg via ORAL
  Filled 2017-01-15 (×2): qty 1

## 2017-01-15 MED ORDER — ADULT MULTIVITAMIN W/MINERALS CH
1.0000 | ORAL_TABLET | Freq: Every day | ORAL | Status: DC
  Administered 2017-01-15 – 2017-01-17 (×3): 1 via ORAL
  Filled 2017-01-15 (×4): qty 1

## 2017-01-15 MED ORDER — NICOTINE POLACRILEX 2 MG MT GUM
2.0000 mg | CHEWING_GUM | OROMUCOSAL | Status: DC | PRN
  Administered 2017-01-15 – 2017-01-17 (×6): 2 mg via ORAL
  Filled 2017-01-15: qty 1
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
## 2017-01-15 NOTE — [facility] Group Notes (Signed)
LCSW Group Therapy Note   01/15/2017 1:15pm   Type of Therapy and Topic:  Group Therapy:  Overcoming Obstacles   Participation Level: Active   Description of Group:    In this group patients will be encouraged to explore what they see as obstacles to their own wellness and recovery. They will be guided to discuss their thoughts, feelings, and behaviors related to these obstacles. The group will process together ways to cope with barriers, with attention given to specific choices patients can make. Each patient will be challenged to identify changes they are motivated to make in order to overcome their obstacles. This group will be process-oriented, with patients participating in exploration of their own experiences as well as giving and receiving support and challenge from other group members.   Therapeutic Goals: 1. Patient will identify personal and current obstacles as they relate to admission. 2. Patient will identify barriers that currently interfere with their wellness or overcoming obstacles.  3. Patient will identify feelings, thought process and behaviors related to these barriers. 4. Patient will identify two changes they are willing to make to overcome these obstacles:    Therapeutic Modalities:   Cognitive Behavioral Therapy Solution Focused Therapy Motivational Interviewing Relapse Prevention Therapy  Georga Kaufmann, MSW, [place] 01/15/2017 3:55 PM

## 2017-01-15 NOTE — Progress Notes (Signed)
NUTRITION ASSESSMENT  Pt identified as at risk on the Malnutrition Screen Tool  INTERVENTION: 1. Supplements: Ensure Enlive po BID, each supplement provides 350 kcal and 20 grams of protein 2. Provide Multivitamin with minerals daily  NUTRITION DIAGNOSIS: Unintentional weight loss related to sub-optimal intake as evidenced by pt report.   Goal: Pt to meet >/= 90% of their estimated nutrition needs.  Monitor:  PO intake  Assessment:  Pt admitted with ETOH abuse. H/o drug use. Pt drinks 2 pints of vodka daily. Pt was eating 50-100% of meals during admission at [facility]. Per weight records, pt has lost 19 lb since 10/1 (12% wt loss x 1 month, significant for time frame). Will order Ensure supplements and MVI.   Height: Ht Readings from Last 1 Encounters:  01/14/17 5\' 8"  (1.727 m)    Weight: Wt Readings from Last 1 Encounters:  01/14/17 144 lb (65.3 kg)    Weight Hx: Wt Readings from Last 10 Encounters:  01/14/17 144 lb (65.3 kg)  01/09/17 151 lb (68.5 kg)  12/17/16 163 lb 8 oz (74.2 kg)  12/11/16 153 lb 6.4 oz (69.6 kg)  11/01/16 160 lb (72.6 kg)  08/11/16 156 lb (70.8 kg)  07/21/16 162 lb (73.5 kg)  07/02/16 158 lb 3.2 oz (71.8 kg)  06/29/16 161 lb (73 kg)    BMI:  Body mass index is 21.9 kg/m. Pt meets criteria for normal based on current BMI.  Estimated Nutritional Needs: Kcal: 25-30 kcal/kg Protein: > 1 gram protein/kg Fluid: 1 ml/kcal  Diet Order:   Pt is also offered choice of unit snacks mid-morning and mid-afternoon.  Pt is eating as desired.   Lab results and medications reviewed.   Clayton Bibles, MS, RD, [place] Dietitian Pager: [phone] After Hours Pager: [phone]

## 2017-01-15 NOTE — Progress Notes (Signed)
D: Pt continues to be very flat and depressed on the unit today. Pt has been seen in the day room interacting with peers. Pt reported this morning that she was ready for discharge, wanted to talk with social worker and dr because she has an appointment coming up tomorrow. Pt reported that her depression was a 7, her hopelessness was a 5, and that her  anxiety was a 10. Pt reported being negative SI/HI, no AH/VH noted. A: 15 min checks continued for patient safety. R: Pts safety maintained.
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
## 2017-01-15 NOTE — Progress Notes (Signed)
Recreation Therapy Notes  Animal-Assisted Activity (AAA) Program Checklist/Progress Notes Patient Eligibility Criteria Checklist & Daily Group note for Rec TxIntervention  Date: 10.30.2018 Time: 2:45pm Location: [address]   AAA/T Program Assumption of Risk Form signed by Patient/ or Parent Legal Guardian Yes  Patient is free of allergies or sever asthma Yes  Patient reports no fear of animals Yes  Patient reports no history of cruelty to animals Yes  Patient understands his/her participation is voluntary Yes  Behavioral Response: Did not attend.    Laureen Ochs Caley Volkert, LRT/CTRS        Qiana Landgrebe L 01/15/2017 2:58 PM
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
## 2017-01-16 DIAGNOSIS — F419 Anxiety disorder, unspecified: Secondary | ICD-10-CM

## 2017-01-16 DIAGNOSIS — T50902A Poisoning by unspecified drugs, medicaments and biological substances, intentional self-harm, initial encounter: Secondary | ICD-10-CM

## 2017-01-16 DIAGNOSIS — F332 Major depressive disorder, recurrent severe without psychotic features: Principal | ICD-10-CM

## 2017-01-16 DIAGNOSIS — G4700 Insomnia, unspecified: Secondary | ICD-10-CM

## 2017-01-16 DIAGNOSIS — F1110 Opioid abuse, uncomplicated: Secondary | ICD-10-CM

## 2017-01-16 DIAGNOSIS — R450 Nervousness: Secondary | ICD-10-CM

## 2017-01-16 DIAGNOSIS — F1410 Cocaine abuse, uncomplicated: Secondary | ICD-10-CM

## 2017-01-16 DIAGNOSIS — T1491XA Suicide attempt, initial encounter: Secondary | ICD-10-CM

## 2017-01-16 DIAGNOSIS — F39 Unspecified mood [affective] disorder: Secondary | ICD-10-CM

## 2017-01-16 DIAGNOSIS — F17210 Nicotine dependence, cigarettes, uncomplicated: Secondary | ICD-10-CM

## 2017-01-16 MED ORDER — NALTREXONE HCL 50 MG PO TABS
50.0000 mg | ORAL_TABLET | Freq: Every day | ORAL | Status: DC
  Administered 2017-01-16 – 2017-01-17 (×2): 50 mg via ORAL
  Filled 2017-01-16 (×4): qty 1

## 2017-01-16 NOTE — Progress Notes (Signed)
D:  Patient denied SI and HI, contracts for safety.  Denied A/V hallucinations.   A:  Medications administered per MD orders.  Emotional support and encouragement given patient. R:  Safety maintained with 15 minute checks.  

## 2017-01-16 NOTE — [facility] Group Notes (Signed)
Adult Psychoeducational Group Note  Date:  01/16/2017 Time:  4:06 AM  Group Topic/Focus:  Wrap-Up Group:   The focus of this group is to help patients review their daily goal of treatment and discuss progress on daily workbooks.  Participation Level:  Active  Participation Quality:  Appropriate and Attentive  Affect:  Appropriate  Cognitive:  Alert and Appropriate  Insight: Appropriate and Good  Engagement in Group:  Engaged  Modes of Intervention:  Discussion  Additional Comments:  Pt attended and participated in group. Pt rated their day a 8/10. Pt goal was to work on not drinking once they are discharged. A positive for the pt was learning there is a high chance of them leaving tomorrow.   Cristi Loron 01/16/2017, 4:06 AM

## 2017-01-16 NOTE — Progress Notes (Signed)
Pt seems brighter and more stable than yesterday.  She reports she feels better, but still experiencing anxiety and depression.  She feels the meds are helping a little, but would still like to be on the seroquel.  She is aware that the Trazodone was increased for tonight.  Pt is pleasant and cooperative this evening.  She makes her needs known to staff.  She denies SI/HI/AVH.  Support and encouragement offered.  Discharge plans are in process.  Pt plans to return home where she lives with her son and her father.  Safety maintained with q15 minute checks.

## 2017-01-16 NOTE — [facility] Suicide Risk Assessment (Signed)
Laura [name] INPATIENT:  Family/Significant Other Suicide Prevention Education  Suicide Prevention Education:  Contact Attempts: Jenny Reichmann (Dad [phone]), has been identified by the patient as the family member/significant other with whom the patient will be residing, and identified as the person(s) who will aid the patient in the event of a mental health crisis.  With written consent from the patient, two attempts were made to provide suicide prevention education, prior to and/or following the patient's discharge.  We were unsuccessful in providing suicide prevention education.  A suicide education pamphlet was given to the patient to share with family/significant other.  Date and time of first attempt: 01/16/17 at 11:30am. No answer, CSW left a message. Date and time of second attempt: 01/16/17 at 4:35pm. No answer, CSW left a mesaage.   Georga Kaufmann, MSW, LCSWA  01/16/2017, 4:37 PM

## 2017-01-16 NOTE — Plan of Care (Signed)
Problem: Self-Concept: Goal: Ability to verbalize positive feelings about self will improve Outcome: Progressing Nurse discussed depression/anxiety/coping skills with patient.

## 2017-01-16 NOTE — Tx Team (Cosign Needed)
Interdisciplinary Treatment and Diagnostic Plan Update 01/16/2017 Time of Session: 9:30am  Laura [name]  MRN: [id number]  Principal Diagnosis: MDD (major depressive disorder), recurrent severe, without psychosis ([place])  Secondary Diagnoses: Principal Problem:   MDD (major depressive disorder), recurrent severe, without psychosis ([place])   Current Medications:  Current Facility-Administered Medications  Medication Dose Route Frequency Provider Last Rate Last Dose  . acetaminophen (TYLENOL) tablet 650 mg  650 mg Oral Q6H PRN Derrill Center, NP   650 mg at 01/14/17 1823  . alum & mag hydroxide-simeth (MAALOX/MYLANTA) 200-200-20 MG/5ML suspension 30 mL  30 mL Oral Q4H PRN Derrill Center, NP      . feeding supplement (ENSURE ENLIVE) (ENSURE ENLIVE) liquid 237 mL  237 mL Oral BID BM Izediuno, Vincent A, MD   237 mL at 01/15/17 1436  . FLUoxetine (PROZAC) capsule 10 mg  10 mg Oral Daily Cobos, Myer Peer, MD   10 mg at 01/16/17 0843  . hydrOXYzine (ATARAX/VISTARIL) tablet 25 mg  25 mg Oral TID PRN Derrill Center, NP   25 mg at 01/15/17 1105  . hydrOXYzine (ATARAX/VISTARIL) tablet 50 mg  50 mg Oral QHS,MR X 1 Laverle Hobby, PA-C   50 mg at 01/15/17 2208  . magnesium hydroxide (MILK OF MAGNESIA) suspension 30 mL  30 mL Oral Daily PRN Derrill Center, NP      . multivitamin with minerals tablet 1 tablet  1 tablet Oral Daily Izediuno, Laruth Bouchard, MD   1 tablet at 01/16/17 0843  . nicotine polacrilex (NICORETTE) gum 2 mg  2 mg Oral PRN Money, Lowry Ram, FNP   2 mg at 01/16/17 0912  . traZODone (DESYREL) tablet 100 mg  100 mg Oral QHS PRN Cobos, Myer Peer, MD   100 mg at 01/15/17 2112    PTA Medications: Prescriptions Prior to Admission  Medication Sig Dispense Refill Last Dose  . DULoxetine (CYMBALTA) 30 MG capsule Take 30 mg by mouth daily.   Past Week at Unknown time  . folic acid (FOLVITE) 1 MG tablet Take 1 tablet (1 mg total) by mouth daily. 30 tablet 0   . pregabalin (LYRICA) 150 MG  capsule Take 1 capsule (150 mg total) by mouth 2 (two) times daily. 60 capsule 11 Past Week at Unknown time  . thiamine 100 MG tablet Take 1 tablet (100 mg total) by mouth daily. 30 tablet 0     Treatment Modalities: Medication Management, Group therapy, Case management,  1 to 1 session with clinician, Psychoeducation, Recreational therapy.  Patient Stressors: Financial difficulties Health problems Legal issue Loss of son Roderic Palau Occupational concerns Substance abuse Traumatic event Patient Strengths: Ability for insight Average or above average intelligence Capable of independent living Curator fund of knowledge Motivation for treatment/growth Physical Health Supportive family/friends Work Artist for Primary Diagnosis: MDD (major depressive disorder), recurrent severe, without psychosis ([place]) Long Term Goal(s): Improvement in symptoms so as ready for discharge Short Term Goals: Ability to disclose and discuss suicidal ideas Ability to demonstrate self-control will improve Ability to maintain clinical measurements within normal limits will improve Compliance with prescribed medications will improve  Medication Management: Evaluate patient's response, side effects, and tolerance of medication regimen.  Therapeutic Interventions: 1 to 1 sessions, Unit Group sessions and Medication administration.  Evaluation of Outcomes: Progressing  Physician Treatment Plan for Secondary Diagnosis: Principal Problem:   MDD (major depressive disorder), recurrent severe, without psychosis ([place])  Long Term Goal(s): Improvement in symptoms  so as ready for discharge  Short Term Goals: Ability to disclose and discuss suicidal ideas Ability to demonstrate self-control will improve Ability to maintain clinical measurements within normal limits will improve Compliance with prescribed medications will improve  Medication Management: Evaluate  patient's response, side effects, and tolerance of medication regimen.  Therapeutic Interventions: 1 to 1 sessions, Unit Group sessions and Medication administration.  Evaluation of Outcomes: Progressing  RN Treatment Plan for Primary Diagnosis: MDD (major depressive disorder), recurrent severe, without psychosis ([place]) Long Term Goal(s): Knowledge of disease and therapeutic regimen to maintain health will improve  Short Term Goals: Compliance with prescribed medications will improve  Medication Management: RN will administer medications as ordered by provider, will assess and evaluate patient's response and provide education to patient for prescribed medication. RN will report any adverse and/or side effects to prescribing provider.  Therapeutic Interventions: 1 on 1 counseling sessions, Psychoeducation, Medication administration, Evaluate responses to treatment, Monitor vital signs and CBGs as ordered, Perform/monitor CIWA, COWS, AIMS and Fall Risk screenings as ordered, Perform wound care treatments as ordered.  Evaluation of Outcomes: Progressing  LCSW Treatment Plan for Primary Diagnosis: MDD (major depressive disorder), recurrent severe, without psychosis ([place]) Long Term Goal(s): Safe transition to appropriate next level of care at discharge, Engage patient in therapeutic group addressing interpersonal concerns. Short Term Goals: Engage patient in aftercare planning with referrals and resources, Facilitate patient progression through stages of change regarding substance use diagnoses and concerns, Identify triggers associated with mental health/substance abuse issues and Increase skills for wellness and recovery  Therapeutic Interventions: Assess for all discharge needs, 1 to 1 time with Social worker, Explore available resources and support systems, Assess for adequacy in community support network, Educate family and significant other(s) on suicide prevention, Complete Psychosocial  Assessment, Interpersonal group therapy.  Evaluation of Outcomes: Progressing  Progress in Treatment: Attending groups: Intermittently  Participating in groups: Yes, when she attends. Taking medication as prescribed: Yes, MD continues to assess for medication changes as needed Toleration medication: Yes, no side effects reported at this time Family/Significant other contact made: No, CSW will make contact if pt consents  Patient understands diagnosis: Limited insight  Discussing patient identified problems/goals with staff: Yes Medical problems stabilized or resolved: Yes Denies suicidal/homicidal ideation: Yes  Issues/concerns per patient self-inventory: None Other: N/A  New problem(s) identified: None identified at this time.   New Short Term/Long Term Goal(s): "I want to go back to life"  Discharge Plan or Barriers: Pt will return home and follow up with an outpatient provider.  Reason for Continuation of Hospitalization:   Depression Medication stabilization Suicidal ideation Withdrawal symptoms  Estimated Length of Stay: 1-2 days; Estimated discharge date 01/17/17  Attendees: Patient: Laura [name] 01/16/2017 11:10 AM  Physician: 01/16/2017 11:10 AM  Nursing: Rise Paganini, RN 01/16/2017 11:10 AM  RN Care Manager: 01/16/2017 11:10 AM  Social Worker: Matthew Saras, [place] 01/16/2017 11:10 AM  Recreational Therapist:  01/16/2017 11:10 AM  Other: Marvia Pickles, NP 01/16/2017 11:10 AM  Other:  01/16/2017 11:10 AM  Other: 01/16/2017 11:10 AM  Scribe for Treatment Team: Georga Kaufmann, MSW,LCSWA 01/16/2017 11:10 AM

## 2017-01-16 NOTE — [facility] Group Notes (Addendum)
LCSW Group Therapy Note  01/16/2017 1:15pm  Type of Therapy/Topic:  Group Therapy:  Emotion Regulation  Participation Level: Active  Description of Group:   The purpose of this group is to assist patients in learning to regulate negative emotions and experience positive emotions. Patients will be guided to discuss ways in which they have been vulnerable to their negative emotions. These vulnerabilities will be juxtaposed with experiences of positive emotions or situations, and patients will be challenged to use positive emotions to combat negative ones. Special emphasis will be placed on coping with negative emotions in conflict situations, and patients will process healthy conflict resolution skills.  Therapeutic Goals: 1. Patient will identify two positive emotions or experiences to reflect on in order to balance out negative emotions 2. Patient will label two or more emotions that they find the most difficult to experience 3. Patient will demonstrate positive conflict resolution skills through discussion and/or role plays  Summary of Patient Progress: Pt was present for the duration of the group. Pt states that she is having a difficult time regulating her frustration because she does not want to be in the hospital any longer. Pt states that if she doesn't discharge tomorrow "It's going to be a very bad day for whoever is keeping me in the hospital". Pt reports that she would like to go home to be with her family as it is the 68 month anniversary of her oldest son's death. Pt continues to minimize her symptoms and be focused only on discharge.     Therapeutic Modalities:   Cognitive Behavioral Therapy Feelings Identification Dialectical Behavioral Therapy   Georga Kaufmann, MSW, [place] 01/16/2017 2:53 PM

## 2017-01-16 NOTE — Progress Notes (Signed)
[facility] MD Progress Note  01/16/2017 10:43 AM Laura [name]  MRN:  [id number]   Subjective:  Patient reports that she is feeling fine today. She reports sleeping good with the Trazodone last night and she doesn't need the Seroquel anymore. [place] estates that she is ready to go home and care for her 44 y.o. Son who is staying with her 31 y.o. Father and her son has stitches in his foot so her father is having to care for her son. She is concerned due to her father being in poor health and he doesn't need to be caring for a 44 y.o. She states that the son kicked the door because he got locked out and that was how he cut his foot and got stitches.   Objective: Patient's chart and findings reviewed and discussed with treatment team. Patient seems to make several excuses for wanting to leave immediately. Yesterday she mentioned that her 77 y.o. Son was out driving her car around and he was the one caring for the father, then today the son has stitches in his foot. They all may be true, but CSW is going to contact the father and get collateral information. Patient denies any SI/HI/AVH and contracts for safety.   Principal Problem: MDD (major depressive disorder), recurrent severe, without psychosis ([place]) Diagnosis:   Patient Active Problem List   Diagnosis Date Noted  . MDD (major depressive disorder) [F32.9] 01/14/2017  . Acute encephalopathy [G93.40] 01/08/2017  . Drug overdose [T50.901A] 01/08/2017  . PTSD (post-traumatic stress disorder) [F43.10] 08/12/2016  . Major depressive disorder, recurrent severe without psychotic features ([place]) [F33.2] 08/11/2016  . Alcohol intoxication in active alcoholic without complication ([place]) [D78.242]   . Suicide attempt ([place]) [T14.91XA]   . Alcohol use disorder, severe, dependence ([place]) [F10.20] 07/23/2016  . 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 Electrolyte imbalance [E87.8]  07/23/2016  . Prolonged Q-T interval on ECG [R94.31] 07/23/2016  . MDD (major depressive disorder), recurrent severe, without psychosis ([place]) [F33.2] 07/21/2016  . Osteoarthritis of spine with radiculopathy, cervical region [facility] 07/02/2016  . Substance abuse ([place]) [F19.10] 07/02/2016  . Lumbar spondylosis [M47.816] 07/02/2016   Total Time spent with patient: 15 minutes  Past Psychiatric History: See H&P  Past Medical History:  Past Medical History:  Diagnosis Date  . Anxiety   . Depression   . ETOH abuse   . Hepatitis C   . Substance abuse [facility])     Past Surgical History:  Procedure Laterality Date  . BACK SURGERY    . KNEE SURGERY     Family History:  Family History  Problem Relation Age of Onset  . Family history unknown: Yes   Family Psychiatric  History: See H&P Social History:  History  Alcohol Use  . Yes    Comment: 2 pints vodka daily     History  Drug Use  . Frequency: 2.0 times per week  . Types: Marijuana, Heroin    Comment: heroin 4 days ago, thc off/on    Social History   Social History  . Marital status: Single    Spouse name: N/A  . Number of children: N/A  . Years of education: N/A   Social History Main Topics  . Smoking status: Current Every Day Smoker    Packs/day: 1.00    Years: 20.00    Types: Cigarettes  . Smokeless tobacco: Never Used  . Alcohol use Yes     Comment: 2  pints vodka daily  . Drug use: Yes    Frequency: 2.0 times per week    Types: Marijuana, Heroin     Comment: heroin 4 days ago, thc off/on  . Sexual activity: No   Other Topics Concern  . None   Social History Narrative  . None   Additional Social History:    Pain Medications: lyrica Prescriptions: cymbalta   folic acid   lyrica   thiamine Over the Counter: see MAR History of alcohol / drug use?: Yes Longest period of sobriety (when/how long): 3 months sober Negative Consequences of Use: Financial, Personal relationships, Work / Youth worker,  Scientist, research (physical sciences) Withdrawal Symptoms: Weakness                    Sleep: Good  Appetite:  Good  Current Medications: Current Facility-Administered Medications  Medication Dose Route Frequency Provider Last Rate Last Dose  . acetaminophen (TYLENOL) tablet 650 mg  650 mg Oral Q6H PRN Derrill Center, NP   650 mg at 01/14/17 1823  . alum & mag hydroxide-simeth (MAALOX/MYLANTA) 200-200-20 MG/5ML suspension 30 mL  30 mL Oral Q4H PRN Derrill Center, NP      . feeding supplement (ENSURE ENLIVE) (ENSURE ENLIVE) liquid 237 mL  237 mL Oral BID BM Izediuno, Vincent A, MD   237 mL at 01/15/17 1436  . FLUoxetine (PROZAC) capsule 10 mg  10 mg Oral Daily Cobos, Myer Peer, MD   10 mg at 01/16/17 0843  . hydrOXYzine (ATARAX/VISTARIL) tablet 25 mg  25 mg Oral TID PRN Derrill Center, NP   25 mg at 01/15/17 1105  . hydrOXYzine (ATARAX/VISTARIL) tablet 50 mg  50 mg Oral QHS,MR X 1 Laverle Hobby, PA-C   50 mg at 01/15/17 2208  . magnesium hydroxide (MILK OF MAGNESIA) suspension 30 mL  30 mL Oral Daily PRN Derrill Center, NP      . multivitamin with minerals tablet 1 tablet  1 tablet Oral Daily Izediuno, Laruth Bouchard, MD   1 tablet at 01/16/17 0843  . nicotine polacrilex (NICORETTE) gum 2 mg  2 mg Oral PRN Money, Lowry Ram, FNP   2 mg at 01/16/17 0912  . traZODone (DESYREL) tablet 100 mg  100 mg Oral QHS PRN Cobos, Myer Peer, MD   100 mg at 01/15/17 2112    Lab Results: No results found for this or any previous visit (from the past 48 hour(s)).  Blood Alcohol level:  Lab Results  Component Value Date   ETH 88 (H) 01/07/2017   ETH 324 (HH) 90/24/0973    Metabolic Disorder Labs: Lab Results  Component Value Date   HGBA1C 4.9 07/23/2016   MPG 94 07/23/2016   No results found for: PROLACTIN Lab Results  Component Value Date   CHOL 239 (H) 07/23/2016   TRIG 95 07/23/2016   HDL 87 07/23/2016   CHOLHDL 2.7 07/23/2016   VLDL 19 07/23/2016   LDLCALC 133 (H) 07/23/2016    Physical Findings: AIMS:  Facial and Oral Movements Muscles of Facial Expression: None, normal Lips and Perioral Area: None, normal Jaw: None, normal Tongue: None, normal,Extremity Movements Upper (arms, wrists, hands, fingers): None, normal Lower (legs, knees, ankles, toes): None, normal, Trunk Movements Neck, shoulders, hips: None, normal, Overall Severity Severity of abnormal movements (highest score from questions above): None, normal Incapacitation due to abnormal movements: None, normal Patient's awareness of abnormal movements (rate only patient's report): No Awareness, Dental Status Current problems with teeth and/or dentures?: No  Does patient usually wear dentures?: No  CIWA:  CIWA-Ar Total: 2 COWS:  COWS Total Score: 4  Musculoskeletal: Strength & Muscle Tone: within normal limits Gait & Station: normal Patient leans: N/A  Psychiatric Specialty Exam: Physical Exam  Nursing note and vitals reviewed. Constitutional: She is oriented to person, place, and time. She appears well-developed and well-nourished.  Respiratory: Effort normal.  Musculoskeletal: Normal range of motion.  Neurological: She is alert and oriented to person, place, and time.  Skin: Skin is warm.    Review of Systems  Constitutional: Negative.   HENT: Negative.   Eyes: Negative.   Respiratory: Negative.   Cardiovascular: Negative.   Gastrointestinal: Negative.   Genitourinary: Negative.   Musculoskeletal: Negative.   Skin: Negative.   Neurological: Negative.   Endo/Heme/Allergies: Negative.   Psychiatric/Behavioral: Positive for depression (Only about losing her oldest son this year). Negative for hallucinations and suicidal ideas. The patient is nervous/anxious.     Blood pressure 109/66, pulse (!) 110, temperature 98 F (36.7 C), temperature source Oral, resp. rate 16, height 5\' 8"  (1.727 m), weight 65.3 kg (144 lb), last menstrual period 04/19/2016, SpO2 100 %.Body mass index is 21.9 kg/m.  General Appearance:  Disheveled  Eye Contact:  Good  Speech:  Clear and Coherent and Normal Rate  Volume:  Normal  Mood:  Anxious  Affect:  Congruent  Thought Process:  Goal Directed and Descriptions of Associations: Intact  Orientation:  Full (Time, Place, and Person)  Thought Content:  WDL  Suicidal Thoughts:  No  Homicidal Thoughts:  No  Memory:  Immediate;   Good Recent;   Good Remote;   Good  Judgement:  Fair  Insight:  Fair  Psychomotor Activity:  Normal  Concentration:  Concentration: Good and Attention Span: Good  Recall:  Good  Fund of Knowledge:  Good  Language:  Good  Akathisia:  No  Handed:  Right  AIMS (if indicated):     Assets:  Agricultural consultant Housing Social Support Transportation  ADL's:  Intact  Cognition:  WNL  Sleep:  Number of Hours: 6.75   Problems Addressed: MDD severe  Treatment Plan Summary: Daily contact with patient to assess and evaluate symptoms and progress in treatment, Medication management and Plan is to:  -Continue Prozac 20 mg PO Daily for mood stability -Continue Trazodone 100 mg PO QHS PRN for insomnia -Continue Vistaril 25 mg PO TID PRN for anxiety -Encourage group therapy participation  Lewis Shock, FNP 01/16/2017, 10:43 AM   Agree with NP Progress Note

## 2017-01-17 MED ORDER — TRAZODONE HCL 100 MG PO TABS: 100.0000 mg | ORAL_TABLET | Freq: Every evening | ORAL | 0 refills | Status: DC | PRN

## 2017-01-17 MED ORDER — HYDROXYZINE HCL 50 MG PO TABS: 50.0000 mg | ORAL_TABLET | Freq: Every evening | ORAL | 0 refills | Status: DC | PRN

## 2017-01-17 MED ORDER — NALTREXONE HCL 50 MG PO TABS: 50.0000 mg | ORAL_TABLET | Freq: Every day | ORAL | 0 refills | Status: DC

## 2017-01-17 MED ORDER — FLUOXETINE HCL 20 MG PO CAPS: 20.0000 mg | ORAL_CAPSULE | Freq: Every day | ORAL | 0 refills | Status: DC

## 2017-01-17 MED ORDER — FLUOXETINE HCL 20 MG PO CAPS
20.0000 mg | ORAL_CAPSULE | Freq: Every day | ORAL | Status: DC
  Filled 2017-01-17: qty 1

## 2017-01-17 NOTE — Progress Notes (Signed)
  [facility] Adult Case Management Discharge Plan :  Will you be returning to the same living situation after discharge:  Yes,  pt returning home. At discharge, do you have transportation home?: Yes,  pt's family will transport. Do you have the ability to pay for your medications: Yes,  pt has insurance.  Release of information consent forms completed and in the chart;  Patient's signature needed at discharge.  Patient to Follow up at: Follow-up Information    BEHAVIORAL HEALTH INTENSIVE PSYCH Follow up on 01/21/2017.   Specialty:  Behavioral Health Why:  Assessment appointment for the Intensive Outpatient Program 11/5 at 8:30am. Please call the office if you need to cancel or reschedule your appointment.  Contact information: [place] [phone] [place], Hospice At Follow up.   Specialty:  Hospice and Palliative Medicine Why:  Please call [place] upon discharge to schedule an assessment appointment for grief counseling.  Contact information: [place] [address] [phone]           Next level of care provider has access to [place] and Suicide Prevention discussed: Yes,  with pt.  Have you used any form of tobacco in the last 30 days? (Cigarettes, Smokeless Tobacco, Cigars, and/or Pipes): Yes  Has patient been referred to the Quitline?: Patient refused referral  Patient has been referred for addiction treatment: Yes  Georga Kaufmann, MSW, LCSWA 01/17/2017, 10:20 AM

## 2017-01-17 NOTE — BHH Suicide Risk Assessment (Signed)
[facility] Discharge Suicide Risk Assessment   Principal Problem: MDD (major depressive disorder), recurrent severe, without psychosis ([place]) Discharge Diagnoses:  Patient Active Problem List   Diagnosis Date Noted  . MDD (major depressive disorder) [F32.9] 01/14/2017  . Acute encephalopathy [G93.40] 01/08/2017  . Drug overdose [T50.901A] 01/08/2017  . PTSD (post-traumatic stress disorder) [F43.10] 08/12/2016  . Major depressive disorder, recurrent severe without psychotic features ([place]) [F33.2] 08/11/2016  . Alcohol intoxication in active alcoholic without complication ([place]) [Z61.096]   . Suicide attempt ([place]) [T14.91XA]   . Alcohol use disorder, severe, dependence ([place]) [F10.20] 07/23/2016  . 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 Electrolyte imbalance [E87.8] 07/23/2016  . Prolonged Q-T interval on ECG [R94.31] 07/23/2016  . MDD (major depressive disorder), recurrent severe, without psychosis ([place]) [F33.2] 07/21/2016  . Osteoarthritis of spine with radiculopathy, cervical region [facility] 07/02/2016  . Substance abuse ([place]) [F19.10] 07/02/2016  . Lumbar spondylosis [M47.816] 07/02/2016    Total Time spent with patient: 20 minutes  Musculoskeletal: Strength & Muscle Tone: within normal limits Gait & Station: normal Patient leans: N/A  Psychiatric Specialty Exam: ROS no headache, no chest pain, no shortness of breath, no vomiting   Blood pressure 113/79, pulse (!) 107, temperature 97.7 F (36.5 C), temperature source Oral, resp. rate 16, height 5\' 8"  (1.727 m), weight 65.3 kg (144 lb), last menstrual period 04/19/2016, SpO2 100 %.Body mass index is 21.9 kg/m.  General Appearance: improved grooming   Eye Contact::  Good  Speech:  Normal Rate  Volume:  Normal  Mood:  improving , states she is feeling better  Affect:  more reactive, but still labile, and briefly tearful when discussing loss of loved ones    Thought Process:  Linear and Descriptions of Associations: Intact  Orientation:  Full (Time, Place, and Person)  Thought Content:  denies hallucinations, no delusions, not internally preoccupied   Suicidal Thoughts:  No denies suicidal or self injurious ideations, denies any homicidal or violent ideations  Homicidal Thoughts:  No  Memory:  recent and remote grossly intact   Judgement:  Other:  improving   Insight:  Fair and improving   Psychomotor Activity:  Normal  Concentration:  Good  Recall:  Good  Fund of Knowledge:Good  Language: Good  Akathisia:  No  Handed:  Right  AIMS (if indicated):     Assets:  Communication Skills Desire for Improvement Resilience  Sleep:  Number of Hours: 6.25  Cognition: WNL  ADL's:  Intact   Mental Status Per Nursing Assessment::   On Admission:  Suicidal ideation indicated by patient, Self-harm thoughts, Self-harm behaviors  Demographic Factors:  46 year old female , lives with father and son.  Loss Factors: Death of one of her  sons in 03-Sep-2016.   Historical Factors: Depression, Alcohol Use Disorder   Risk Reduction Factors:   Sense of responsibility to family, Living with another person, especially a relative and Positive coping skills or problem solving skills  Continued Clinical Symptoms:  Patient presents alert, attentive, less anxious than on admission. Currently denies depression and states she is feeling better , no thought disorder, denies suicidal ideations, and states recent overdose was accidental and not suicidal in nature, denies homicidal or violent ideations, future oriented , states she is looking forward to spending time with her son , and states she plans to attend AA meetings . No disruptive behaviors on unit Denies medication side effects, thus far tolerating Prozac and Naltrexone  well . Side effects discussed. Will increase Prozac from 10 to 20 mgrs daily prior to discharge.    Cognitive Features That Contribute To  Risk:  No gross cognitive deficits noted upon discharge. Is alert , attentive, and oriented x 3   Suicide Risk:  Mild:  Suicidal ideation of limited frequency, intensity, duration, and specificity.  There are no identifiable plans, no associated intent, mild dysphoria and related symptoms, good self-control (both objective and subjective assessment), few other risk factors, and identifiable protective factors, including available and accessible social support.  Follow-up Information    BEHAVIORAL HEALTH INTENSIVE PSYCH Follow up on 01/21/2017.   Specialty:  Behavioral Health Why:  Assessment appointment for the Intensive Outpatient Program 11/5 at 8:30am. Please call the office if you need to cancel or reschedule your appointment.  Contact information: [place] [phone] [place], Hospice At Follow up.   Specialty:  Hospice and Palliative Medicine Why:  Please call [place] upon discharge to schedule an assessment appointment for grief counseling.  Contact information: [place] [address] [phone]           Plan Of Care/Follow-up recommendations:  Activity:  as tolerated  Diet:  regular Tests:  NA Other:  See below  Patient is requesting discharge and there are no current grounds for involuntary commitment  Plans to return home. Plans to follow up as above, and also states she intends to participate in [place], MD 01/17/2017, 9:50 AM

## 2017-01-17 NOTE — Progress Notes (Signed)
Pt reported to writer that her day was not good, but she is hopeful that it will be better tomorrow, and she will be discharged tomorrow.  She has been up and out of her room this evening.  She has been in the dayroom talking with peers and watching TV.  She has been pleasant and cooperative with staff.  She makes her needs known to staff.  She denies SI/HI/AVH.  She denies having any withdrawal symptoms, and voices no needs or complaints to Probation officer.  Pt was given Trazodone 100 mg with her scheduled Vistaril 50 mg at bedtime.  Support and encouragement offered.  Discharge plans are in process.  Safety maintained with q15 minute checks.

## 2017-01-17 NOTE — BHH Group Notes (Signed)
The focus of this group is to educate the patient on the purpose and policies of crisis stabilization and provide a format to answer questions about their admission.  The group details unit policies and expectations of patients while admitted.  Patient attended 0900 nurse education orientation group this morning.  Patient actively participated and had appropriate affect.  Patient was alert.  Patient had appropriate insight and appropriate engagements.  Today patient will work on 3 goals for discharge.
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
## 2017-01-17 NOTE — Progress Notes (Signed)
Discharge note: Pt received both written and verbal discharge instructions. Pt verbalized understanding of discharge instructions. Pt agreed to f/u appt and med regimen. Pt received prescriptions, AVS, Transitional record, SRA and belongings from locker. Pt safely discharged to the lobby and picked up from her father.

## 2017-01-31 ENCOUNTER — Inpatient Hospital Stay: Payer: Medicare Other | Admitting: Family Medicine

## 2017-03-11 ENCOUNTER — Ambulatory Visit: Payer: Self-pay | Admitting: Neurology

## 2017-03-15 DIAGNOSIS — M545 Low back pain: Secondary | ICD-10-CM | POA: Diagnosis not present

## 2017-03-19 DIAGNOSIS — R32 Unspecified urinary incontinence: Secondary | ICD-10-CM

## 2017-03-19 HISTORY — DX: Unspecified urinary incontinence: R32

## 2017-03-20 DIAGNOSIS — M545 Low back pain: Secondary | ICD-10-CM | POA: Diagnosis not present

## 2017-03-29 ENCOUNTER — Ambulatory Visit (INDEPENDENT_AMBULATORY_CARE_PROVIDER_SITE_OTHER): Payer: Medicare Other | Admitting: Family Medicine

## 2017-03-29 ENCOUNTER — Encounter: Payer: Self-pay | Admitting: Family Medicine

## 2017-03-29 VITALS — BP 118/80 | HR 100 | Temp 98.3°F | Ht 68.0 in | Wt 162.2 lb

## 2017-03-29 DIAGNOSIS — G894 Chronic pain syndrome: Secondary | ICD-10-CM | POA: Insufficient documentation

## 2017-03-29 DIAGNOSIS — B182 Chronic viral hepatitis C: Secondary | ICD-10-CM | POA: Diagnosis not present

## 2017-03-29 DIAGNOSIS — M545 Low back pain: Secondary | ICD-10-CM

## 2017-03-29 DIAGNOSIS — R1032 Left lower quadrant pain: Secondary | ICD-10-CM | POA: Diagnosis not present

## 2017-03-29 DIAGNOSIS — G4700 Insomnia, unspecified: Secondary | ICD-10-CM | POA: Diagnosis not present

## 2017-03-29 LAB — POCT URINALYSIS DIP (MANUAL ENTRY)
BILIRUBIN UA: NEGATIVE mg/dL
Bilirubin, UA: NEGATIVE
Blood, UA: NEGATIVE
Glucose, UA: NEGATIVE mg/dL
LEUKOCYTES UA: NEGATIVE
Nitrite, UA: NEGATIVE
PROTEIN UA: NEGATIVE mg/dL
Spec Grav, UA: 1.015 (ref 1.010–1.025)
Urobilinogen, UA: 0.2 E.U./dL
pH, UA: 7.5 (ref 5.0–8.0)

## 2017-03-29 LAB — POCT URINE PREGNANCY: Preg Test, Ur: NEGATIVE

## 2017-03-29 MED ORDER — TRAZODONE HCL 100 MG PO TABS: 100.0000 mg | ORAL_TABLET | Freq: Every evening | ORAL | 0 refills | Status: DC | PRN

## 2017-03-29 NOTE — Assessment & Plan Note (Signed)
Stable on trazodone.  This was refilled today.

## 2017-03-29 NOTE — Progress Notes (Signed)
Subjective:  Laura [name] is a 47 y.o. female who presents today with a chief complaint of abdominal pain.   HPI:  Abdominal Pain, New Issue Started 2 days ago. Intermittent in nature.  Located in the left lower quadrant.  Stable over last couple days.  No history of similar symptoms in the past.  No nausea or vomiting.  No constipation or diarrhea.  Had a normal bowel movement yesterday.  No fevers or chills.  No dysuria.  No hematuria.  No vaginal discharge.  Last period was about 3 months ago.  Took a home pregnancy test which was negative.  Low back pain, new issue Patient has been living in [place] for the last few months.  Had a x-ray done there which showed curvature of her lumbar spine.  They had ordered an MRI however she moved back to [facility] and was unable to get this done there.  Hepatitis C, established problem, stable Patient wishes to be referred to ID for treatment.  ROS: Per HPI  PMH: She reports that she has been smoking cigarettes.  She has a 20.00 pack-year smoking history. she has never used smokeless tobacco. She reports that she drinks alcohol. She reports that she uses drugs. Drugs: Marijuana and Heroin. Frequency: 2.00 times per week.  Objective:  Physical Exam: BP 118/80 (BP Location: Left Arm, Patient Position: Sitting, Cuff Size: Normal)   Pulse 100   Temp 98.3 F (36.8 C) (Oral)   Ht 5\' 8"  (1.727 m)   Wt 162 lb 3.2 oz (73.6 kg)   SpO2 96%   BMI 24.66 kg/m   Gen: NAD, resting comfortably CV: RRR with no murmurs appreciated Pulm: NWOB, CTAB with no crackles, wheezes, or rhonchi GI: Normal bowel sounds present.  Tender to palpation in left lower quadrant.  No rebound or guarding.  Murphy sign negative. MSK: Tender to palpation along lumbar spine.  No CVA tenderness. Skin: Warm, dry Neuro: Grossly normal, moves all extremities Psych: Normal affect and thought content  Results for orders placed or performed in visit on 03/29/17 (from the  past 24 hour(s))  POCT urine pregnancy     Status: Normal   Collection Time: 03/29/17  2:15 PM  Result Value Ref Range   Preg Test, Ur Negative Negative  POCT urinalysis dipstick     Status: None   Collection Time: 03/29/17  2:16 PM  Result Value Ref Range   Color, UA yellow yellow   Clarity, UA clear clear   Glucose, UA negative negative mg/dL   Bilirubin, UA negative negative   Ketones, POC UA negative negative mg/dL   Spec Grav, UA 1.015 1.010 - 1.025   Blood, UA negative negative   pH, UA 7.5 5.0 - 8.0   Protein Ur, POC negative negative mg/dL   Urobilinogen, UA 0.2 0.2 or 1.0 E.U./dL   Nitrite, UA Negative Negative   Leukocytes, UA Negative Negative   Assessment/Plan:  Abdominal pain No red flag signs or symptoms.  Her UA is negative.  Pregnancy test is negative.  Based on history, concern for fibroids versus ovarian cyst.  We will check pelvic ultrasound to rule this out.  Discussed strict reasons to return to care including worsening of abdominal pain, development of nausea, vomiting, and fever/chills.  Ultrasound negative and patient's symptoms persist, will need abdominal CT.  Chronic hepatitis C ([place]) We will place referral to ID.  Low back pain No red flag signs or symptoms.  We will order lumbar MRI to  further characterize her anatomic abnormalities.  Will likely need referral to orthopedics.  Insomnia Stable on trazodone.  This was refilled today.  Algis Greenhouse. Jerline Pain, MD 03/29/2017 2:58 PM

## 2017-03-29 NOTE — Patient Instructions (Addendum)
We need to get an ultrasound to make you dont have a cyst or fibroids.  We will get an MRI.  We will also send you to ID.  Take care, Dr Jerline Pain

## 2017-03-29 NOTE — Assessment & Plan Note (Signed)
No red flag signs or symptoms.  We will order lumbar MRI to further characterize her anatomic abnormalities.  Will likely need referral to orthopedics.

## 2017-03-29 NOTE — Assessment & Plan Note (Signed)
We will place referral to ID.

## 2017-04-11 ENCOUNTER — Other Ambulatory Visit: Payer: Self-pay

## 2017-04-11 ENCOUNTER — Telehealth: Payer: Self-pay | Admitting: Family Medicine

## 2017-04-11 MED ORDER — QUETIAPINE FUMARATE 400 MG PO TABS: 400.0000 mg | ORAL_TABLET | Freq: Every day | ORAL | 3 refills | Status: DC

## 2017-04-11 NOTE — Telephone Encounter (Signed)
Please advise on refill.

## 2017-04-11 NOTE — Telephone Encounter (Signed)
Rx sent to patient's pharmacy

## 2017-04-11 NOTE — Telephone Encounter (Signed)
Copied from [place] [id number]. Topic: Quick Communication - See Telephone Encounter >> Apr 11, 2017  1:36 PM Conception Chancy, NT wrote: CRM for notification. See Telephone encounter for:  04/11/17.  Patient states she has been out of her anxiety medication (Seroquel 400mg ) and would like that called [facility]. I notified pt I did not see where Dr. Jerline Pain prescribed this to her and she states he will be able to see she takes this medication on her file. Please advise and contact pt if in need of further information.

## 2017-04-11 NOTE — Telephone Encounter (Signed)
See note

## 2017-04-11 NOTE — Telephone Encounter (Signed)
Ok with me. Please place any necessary orders. 

## 2017-04-12 ENCOUNTER — Encounter: Payer: Self-pay | Admitting: Family Medicine

## 2017-04-16 ENCOUNTER — Ambulatory Visit (HOSPITAL_COMMUNITY): Admission: RE | Admit: 2017-04-16 | Payer: Medicare Other | Source: Ambulatory Visit

## 2017-04-16 ENCOUNTER — Ambulatory Visit (HOSPITAL_COMMUNITY)
Admission: RE | Admit: 2017-04-16 | Discharge: 2017-04-16 | Disposition: A | Discharge status: Home / Self Care | Payer: Medicare Other | Source: Ambulatory Visit | Attending: Family Medicine | Admitting: Family Medicine

## 2017-04-16 DIAGNOSIS — N7011 Chronic salpingitis: Secondary | ICD-10-CM | POA: Insufficient documentation

## 2017-04-16 DIAGNOSIS — D252 Subserosal leiomyoma of uterus: Secondary | ICD-10-CM | POA: Diagnosis not present

## 2017-04-16 DIAGNOSIS — R1032 Left lower quadrant pain: Secondary | ICD-10-CM | POA: Insufficient documentation

## 2017-04-18 ENCOUNTER — Ambulatory Visit (INDEPENDENT_AMBULATORY_CARE_PROVIDER_SITE_OTHER): Payer: Medicare Other | Admitting: Family Medicine

## 2017-04-18 ENCOUNTER — Encounter: Payer: Self-pay | Admitting: Family Medicine

## 2017-04-18 VITALS — BP 118/64 | HR 87 | Temp 97.8°F | Ht 68.0 in | Wt 163.8 lb

## 2017-04-18 DIAGNOSIS — M545 Low back pain: Secondary | ICD-10-CM | POA: Diagnosis not present

## 2017-04-18 DIAGNOSIS — B182 Chronic viral hepatitis C: Secondary | ICD-10-CM

## 2017-04-18 DIAGNOSIS — G4700 Insomnia, unspecified: Secondary | ICD-10-CM

## 2017-04-18 DIAGNOSIS — G8929 Other chronic pain: Secondary | ICD-10-CM | POA: Diagnosis not present

## 2017-04-18 DIAGNOSIS — D259 Leiomyoma of uterus, unspecified: Secondary | ICD-10-CM | POA: Insufficient documentation

## 2017-04-18 MED ORDER — TRAZODONE HCL 100 MG PO TABS: 100.0000 mg | ORAL_TABLET | Freq: Every evening | ORAL | 11 refills | Status: DC | PRN

## 2017-04-18 NOTE — Assessment & Plan Note (Signed)
No red flag signs or symptoms.  Patient request referral to pain management for symptom management.  This referral was placed today.  Advised patient that I would not be prescribing chronic narcotics.

## 2017-04-18 NOTE — Assessment & Plan Note (Signed)
Possibly the source of patient's pelvic pain.  Referral to OB/GYN placed.

## 2017-04-18 NOTE — Assessment & Plan Note (Signed)
Stable.  Continue trazodone.  This was refilled today.

## 2017-04-18 NOTE — Patient Instructions (Signed)
Please call the infectious disease clinic to set up an appointment to discuss treatment for your hepatitis C. Phone: [phone]  I will put in a referral to gynecology for your fibroid.  I will submitted a referral for pain management for your low back pain.  Please call to get an MRI of your lower back as soon as possible.  Come back to see me after your MRI.  Take care, Dr. Jerline Pain

## 2017-04-18 NOTE — Progress Notes (Signed)
    Subjective:  Laura [name] is a 47 y.o. female who presents today with a chief complaint of pelvic pain follow up.   HPI:  Pelvic Pain, Established Problem, stable Patient seen about 3 weeks ago for this.  She has had a pelvic ultrasound performed which revealed a single fibroid and a right hydrosalpinx.  Patient symptoms were primarily located to her left side.  No change in her symptoms since her last visit.  No vaginal discharge.  No vaginal bleeding.  Chronic low back pain, established problem, stable Several year history  Currently takes Robaxin as needed which helps a little bit.  Symptoms have begun to interfere with her daily life.  She has tried several medications in the past including NSAIDs, Tylenol, and tramadol all of which did not significantly seem to help.  No lower extremity weakness or numbness.  Insomnia, established problem, stable Takes trazodone as needed.  Does well with this without side effects.  Chronic hepatitis C, established problem, stable Patient is awaiting infectious disease appointment.  ROS: Per HPI  PMH: She reports that she has been smoking cigarettes.  She has a 20.00 pack-year smoking history. she has never used smokeless tobacco. She reports that she drinks alcohol. She reports that she uses drugs. Drugs: Marijuana and Heroin. Frequency: 2.00 times per week.  Objective:  Physical Exam: BP 118/64 (BP Location: Left Arm, Patient Position: Sitting, Cuff Size: Normal)   Pulse 87   Temp 97.8 F (36.6 C) (Oral)   Ht 5\' 8"  (1.727 m)   Wt 163 lb 12.8 oz (74.3 kg)   SpO2 96%   BMI 24.91 kg/m   Gen: NAD, resting comfortably Neuro: Grossly normal, moves all extremities Psych: Normal affect and thought content  Assessment/Plan:  Uterine leiomyoma Possibly the source of patient's pelvic pain.  Referral to OB/GYN placed.  Low back pain No red flag signs or symptoms.  Patient request referral to pain management for symptom management.  This  referral was placed today.  Advised patient that I would not be prescribing chronic narcotics.  Insomnia Stable.  Continue trazodone.  This was refilled today.  Chronic hepatitis C ([place]) Referral to ID placed.  Looks like they attempted to schedule patient however she was not able to be reached.  Gave phone number instructed patient to call to schedule appointment.   Algis Greenhouse. Jerline Pain, MD 04/18/2017 2:05 PM

## 2017-04-18 NOTE — Assessment & Plan Note (Signed)
Referral to ID placed.  Looks like they attempted to schedule patient however she was not able to be reached.  Gave phone number instructed patient to call to schedule appointment.

## 2017-05-27 ENCOUNTER — Ambulatory Visit (INDEPENDENT_AMBULATORY_CARE_PROVIDER_SITE_OTHER): Payer: Medicare Other | Admitting: Family Medicine

## 2017-05-27 ENCOUNTER — Telehealth: Payer: Self-pay | Admitting: Family Medicine

## 2017-05-27 ENCOUNTER — Encounter: Payer: Self-pay | Admitting: Family Medicine

## 2017-05-27 VITALS — BP 116/74 | HR 86 | Temp 98.3°F | Ht 68.0 in | Wt 160.0 lb

## 2017-05-27 DIAGNOSIS — R739 Hyperglycemia, unspecified: Secondary | ICD-10-CM

## 2017-05-27 DIAGNOSIS — B353 Tinea pedis: Secondary | ICD-10-CM | POA: Diagnosis not present

## 2017-05-27 DIAGNOSIS — F1920 Other psychoactive substance dependence, uncomplicated: Secondary | ICD-10-CM

## 2017-05-27 LAB — GLUCOSE, POCT (MANUAL RESULT ENTRY): POC Glucose: 125 mg/dl — AB (ref 70–99)

## 2017-05-27 MED ORDER — KETOCONAZOLE 2 % EX CREA: 1.0000 "application " | TOPICAL_CREAM | Freq: Two times a day (BID) | CUTANEOUS | 0 refills | Status: DC

## 2017-05-27 MED ORDER — TRAZODONE HCL 100 MG PO TABS: 100.0000 mg | ORAL_TABLET | Freq: Every evening | ORAL | 11 refills | Status: DC | PRN

## 2017-05-27 MED ORDER — QUETIAPINE FUMARATE 400 MG PO TABS: 400.0000 mg | ORAL_TABLET | Freq: Every day | ORAL | 3 refills | Status: DC

## 2017-05-27 NOTE — Telephone Encounter (Signed)
See note

## 2017-05-27 NOTE — Progress Notes (Signed)
    Subjective:  Laura [name] is a 47 y.o. female who presents today with a chief complaint of IV drug abuse.   HPI:  IV Drug Abuse, New Problem Several year history.  Recently completed detox program in [facility]  She was discharged 4 days ago.  She has a long-standing history of IV heroin use.  States that she has been in chronic pain for a long time due to multiple back surgeries.  She has not used any drugs since being discharged from detox.  She has been on methadone in the past.  She would like to try Suboxone this time.  Foot pain, new problem Started yesterday.  Located between her first and second digit.  Patient was walking when she felt a sharp pain.  Looked down and noticed that the skin is cracked open.  No fevers or chills.  No drainage.  No treatments tried.  ROS: Per HPI  PMH: She reports that she has been smoking cigarettes.  She has a 20.00 pack-year smoking history. she has never used smokeless tobacco. She reports that she drinks alcohol. She reports that she uses drugs. Drugs: Marijuana and Heroin. Frequency: 2.00 times per week.  Objective:  Physical Exam: BP 116/74 (BP Location: Left Arm, Patient Position: Sitting, Cuff Size: Normal)   Pulse 86   Temp 98.3 F (36.8 C) (Oral)   Ht 5\' 8"  (1.727 m)   Wt 160 lb (72.6 kg)   SpO2 97%   BMI 24.33 kg/m   Gen: NAD, resting comfortably Skin: Macerated lesion noted in interdigital space of first and second digit of right foot.  Results for orders placed or performed in visit on 05/27/17 (from the past 24 hour(s))  POCT glucose (manual entry)     Status: Abnormal   Collection Time: 05/27/17 10:59 AM  Result Value Ref Range   POC Glucose 125 (A) 70 - 99 mg/dl   Assessment/Plan:  Drug addiction ([place]) Encouraged continued cessation.  We will be placing a urgent referral to addiction specialist to hopefully start patient on Suboxone.  Tinea pedis Exam consistent with tinea pedis.  Start  ketoconazole cream.  Encourage patient to apply daily moisturizers to lower extremities.  Hyperglycemia Random CBG 125 today.  Algis Greenhouse. Jerline Pain, MD 05/27/2017 11:06 AM

## 2017-05-27 NOTE — Patient Instructions (Signed)
We will place an urgent referral to a suboxone clinic.  Please start the cream for your foot.   Take care, Dr Jerline Pain

## 2017-05-27 NOTE — Telephone Encounter (Signed)
Copied from [place]. Topic: Quick Communication - See Telephone Encounter >> May 27, 2017 11:25 AM Antonieta Iba C wrote: CRM for notification. See Telephone encounter for: pt called in to be advised. Pt says that she came in and forgot to discuss a depression medication with provider. Pt says that along with her addiction she was told that she need to be on a depression med. Pt says the name of medication that she was suggested is Depicar (she's not sure) pt would like to discuss further.   CB:[phone]  Pharmacy: [id number] - [place], [place] [phone] (Phone) [phone] (Fax)    05/27/17.

## 2017-05-27 NOTE — Assessment & Plan Note (Signed)
Encouraged continued cessation.  We will be placing a urgent referral to addiction specialist to hopefully start patient on Suboxone.

## 2017-05-27 NOTE — Addendum Note (Signed)
Addended by: Vivi Barrack on: 05/27/2017 11:26 AM   Modules accepted: Orders

## 2017-05-27 NOTE — Telephone Encounter (Signed)
Please advise 

## 2017-05-28 ENCOUNTER — Ambulatory Visit (INDEPENDENT_AMBULATORY_CARE_PROVIDER_SITE_OTHER): Payer: Medicare Other | Admitting: Internal Medicine

## 2017-05-28 ENCOUNTER — Encounter: Payer: Self-pay | Admitting: Internal Medicine

## 2017-05-28 DIAGNOSIS — F17210 Nicotine dependence, cigarettes, uncomplicated: Secondary | ICD-10-CM

## 2017-05-28 DIAGNOSIS — F1920 Other psychoactive substance dependence, uncomplicated: Secondary | ICD-10-CM | POA: Diagnosis not present

## 2017-05-28 DIAGNOSIS — F1990 Other psychoactive substance use, unspecified, uncomplicated: Secondary | ICD-10-CM

## 2017-05-28 DIAGNOSIS — B182 Chronic viral hepatitis C: Secondary | ICD-10-CM | POA: Diagnosis not present

## 2017-05-28 DIAGNOSIS — F1020 Alcohol dependence, uncomplicated: Secondary | ICD-10-CM | POA: Diagnosis not present

## 2017-05-28 NOTE — Telephone Encounter (Signed)
Looks like pt is on schedule for tomorrow morning. Not sure what med she is referring to. Maybe depakote but that is a med for bipolar and not depression.  Algis Greenhouse. Jerline Pain, MD 05/28/2017 8:08 AM
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
## 2017-05-28 NOTE — Telephone Encounter (Signed)
Patient is scheduled for an appointment on 05/29/2017.

## 2017-05-28 NOTE — Progress Notes (Signed)
HPI: Laura [name] is a 47 y.o. female is here to see Dr. Megan Salon for her hep C.   No results found for: HCVGENOTYPE, HEPCGENOTYPE  Allergies: No Known Allergies  Vitals: Temp: 98.1 F (36.7 C) (03/12 1531) Temp Source: Oral (03/12 1531) BP: 117/71 (03/12 1531) Pulse Rate: 93 (03/12 1531)  Past Medical History: Past Medical History:  Diagnosis Date  . Anxiety   . Depression   . ETOH abuse   . Hepatitis C   . Substance abuse ([place])     Social History: Social History   Socioeconomic History  . Marital status: Single    Spouse name: None  . Number of children: None  . Years of education: None  . Highest education level: None  Social Needs  . Financial resource strain: None  . Food insecurity - worry: None  . Food insecurity - inability: None  . Transportation needs - medical: None  . Transportation needs - non-medical: None  Occupational History  . None  Tobacco Use  . Smoking status: Current Every Day Smoker    Packs/day: 1.00    Years: 20.00    Pack years: 20.00    Types: Cigarettes  . Smokeless tobacco: Never Used  Substance and Sexual Activity  . Alcohol use: Yes    Comment: occassionally  . Drug use: Yes    Frequency: 2.0 times per week    Types: Marijuana, Heroin    Comment: heroin 4 days ago, thc off/on  . Sexual activity: No    Birth control/protection: None  Other Topics Concern  . None  Social History Narrative  . None    Labs: No results found for: HIV1RNAQUANT, HIV1RNAVL, CD4TABS, HEPBSAB, HEPBSAG, HCVAB  No results found for: HCVGENOTYPE, HEPCGENOTYPE  Hepatitis C RNA quantitative Latest Ref Rng & Units 12/11/2016  HCV Quantitative Log NOT DETECT Log IU/mL 6.06(H)    AST (U/L)  Date Value  01/11/2017 27  01/09/2017 46 (H)  01/08/2017 28   ALT (U/L)  Date Value  01/11/2017 24  01/09/2017 24  01/08/2017 20   INR (no units)  Date Value  01/11/2017 0.93    CrCl: CrCl cannot be calculated (Patient's most recent lab  result is older than the maximum 21 days allowed.).  Fibrosis Score: Pending  Child-Pugh Score: Pending  Previous Treatment Regimen: None  Assessment: Laura [name] is here today to initiate the treatment process for her hep C. She is an IVDA. Her viral load is positive but we need to do all labs for her hep C. She has an active Medicare part D. We will bring her back in a couple of weeks to eval her labs and potentially her [place] before treatment. Counseled her extensively on the importance of being drug free or else she will re-infect herself with hep C. It looks like she will probably be a class A patient.   Recommendations:  Hep C labs today [place] before treatment F/u with pharmacy in a few weeks  Laura [name], [place].D., BCPS, AAHIVP Clinical Infectious [place] for Infectious Disease 05/28/2017, 4:11 PM

## 2017-05-29 ENCOUNTER — Ambulatory Visit: Payer: Medicare Other | Admitting: Family Medicine

## 2017-05-29 LAB — COMPREHENSIVE METABOLIC PANEL
AG Ratio: 1.4 (calc) (ref 1.0–2.5)
ALBUMIN MSPROF: 4 g/dL (ref 3.6–5.1)
ALKALINE PHOSPHATASE (APISO): 74 U/L (ref 33–115)
ALT: 23 U/L (ref 6–29)
AST: 24 U/L (ref 10–30)
BILIRUBIN TOTAL: 0.2 mg/dL (ref 0.2–1.2)
BUN: 11 mg/dL (ref 7–25)
CALCIUM: 9.2 mg/dL (ref 8.6–10.2)
CO2: 25 mmol/L (ref 20–32)
Chloride: 107 mmol/L (ref 98–110)
Creat: 0.7 mg/dL (ref 0.50–1.10)
GLOBULIN: 2.9 g/dL (ref 1.9–3.7)
Glucose, Bld: 70 mg/dL (ref 65–99)
POTASSIUM: 3.8 mmol/L (ref 3.5–5.3)
Sodium: 142 mmol/L (ref 135–146)
Total Protein: 6.9 g/dL (ref 6.1–8.1)

## 2017-05-29 LAB — HIV ANTIBODY (ROUTINE TESTING W REFLEX): HIV: NONREACTIVE

## 2017-05-29 LAB — HEPATITIS A ANTIBODY, TOTAL: Hepatitis A AB,Total: NONREACTIVE

## 2017-05-29 LAB — CBC
HEMATOCRIT: 36.2 % (ref 35.0–45.0)
Hemoglobin: 12.9 g/dL (ref 11.7–15.5)
MCH: 33.1 pg — ABNORMAL HIGH (ref 27.0–33.0)
MCHC: 35.6 g/dL (ref 32.0–36.0)
MCV: 92.8 fL (ref 80.0–100.0)
MPV: 9.7 fL (ref 7.5–12.5)
Platelets: 401 10*3/uL — ABNORMAL HIGH (ref 140–400)
RBC: 3.9 10*6/uL (ref 3.80–5.10)
RDW: 12.7 % (ref 11.0–15.0)
WBC: 9.1 10*3/uL (ref 3.8–10.8)

## 2017-05-29 LAB — HEPATITIS B SURFACE ANTIGEN: Hepatitis B Surface Ag: NONREACTIVE

## 2017-05-29 LAB — PROTIME-INR
INR: 0.9
Prothrombin Time: 9.7 s (ref 9.0–11.5)

## 2017-05-29 LAB — HEPATITIS B SURFACE ANTIBODY,QUALITATIVE: Hep B S Ab: NONREACTIVE

## 2017-05-31 DIAGNOSIS — Z79899 Other long term (current) drug therapy: Secondary | ICD-10-CM | POA: Diagnosis not present

## 2017-05-31 LAB — HEPATITIS C GENOTYPE

## 2017-06-04 ENCOUNTER — Ambulatory Visit (HOSPITAL_COMMUNITY): Payer: Medicare Other

## 2017-06-05 ENCOUNTER — Ambulatory Visit: Payer: Self-pay | Admitting: *Deleted

## 2017-06-05 DIAGNOSIS — Z79899 Other long term (current) drug therapy: Secondary | ICD-10-CM | POA: Diagnosis not present

## 2017-06-05 NOTE — Telephone Encounter (Signed)
Pt called with severe abd pain. She states that she had an u/s that showed she has cysts in her fallopian tubes. She states the pain feels like contractures. She is also already being treated for a UIT with an antibiotic and steroid.  Flow was notified at [place] at [facility] while patient was on the phone. We both encouraged the patient to go to the emergency department to be assessed. Pt at first said that she would go tomorrow because she did not have a car but would go tomorrow. She later said that she would go today and be seen. She would get someone to take her to the emergency department.                                                                                         sReason for Disposition . [1] SEVERE pain (e.g., excruciating) AND [2] present > 1 hour  Answer Assessment - Initial Assessment Questions 1. LOCATION: "Where does it hurt?"      Whole abd 2. RADIATION: "Does the pain shoot anywhere else?" (e.g., chest, back)     Has chronic back pain, does not go anywhere 3. ONSET: "When did the pain begin?" (e.g., minutes, hours or days ago)      sunday 4. SUDDEN: "Gradual or sudden onset?"     gradual 5. PATTERN "Does the pain come and go, or is it constant?"    - If constant: "Is it getting better, staying the same, or worsening?"      (Note: Constant means the pain never goes away completely; most serious pain is constant and it progresses)     - If intermittent: "How long does it last?" "Do you have pain now?"     (Note: Intermittent means the pain goes away completely between bouts)     contant 6. SEVERITY: "How bad is the pain?"  (e.g., Scale 1-10; mild, moderate, or severe)   - MILD (1-3): doesn\'t interfere with normal activities, abdomen soft and not tender to touch    - MODERATE (4-7): interferes with normal activities or awakens from sleep, tender to touch    - SEVERE (8-10): excruciating pain, doubled over, unable to do any normal activities      10  pain # 7.  RECURRENT SYMPTOM: "Have you ever had this type of abdominal pain before?" If so, ask: "When was the last time?" and "What happened that time?"      no 8. CAUSE: "What do you think is causing the abdominal pain?"     Not sure what is causing it. 9. RELIEVING/AGGRAVATING FACTORS: "What makes it better or worse?" (e.g., movement, antacids, bowel movement)     Nothing makes it better. 10. OTHER SYMPTOMS: "Has there been any vomiting, diarrhea, constipation, or urine problems?"        Urinary  frequently , states she has a UTI. 11. PREGNANCY: "Is there any chance you are pregnant?" "When was your last menstrual period?"       LMP in November  Protocols used: [place]

## 2017-06-05 NOTE — Telephone Encounter (Signed)
See note

## 2017-06-08 ENCOUNTER — Other Ambulatory Visit: Payer: Self-pay | Admitting: Pharmacist Clinician (PhC)/ Clinical Pharmacy Specialist

## 2017-06-08 DIAGNOSIS — B182 Chronic viral hepatitis C: Secondary | ICD-10-CM

## 2017-06-11 ENCOUNTER — Telehealth: Payer: Self-pay | Admitting: Pharmacist Clinician (PhC)/ Clinical Pharmacy Specialist

## 2017-06-11 NOTE — Telephone Encounter (Signed)
Laura [name] missed her appt with [place] for her hep C. She will need to reschedule with them. Gave her Ashley's number so she can help coordinate.

## 2017-06-24 ENCOUNTER — Ambulatory Visit: Payer: Medicare Other | Admitting: Family Medicine

## 2017-06-26 ENCOUNTER — Encounter: Payer: Self-pay | Admitting: Family Medicine

## 2017-06-26 DIAGNOSIS — Z79899 Other long term (current) drug therapy: Secondary | ICD-10-CM | POA: Diagnosis not present

## 2017-07-09 ENCOUNTER — Telehealth: Payer: Self-pay | Admitting: Radiology

## 2017-07-09 NOTE — Telephone Encounter (Signed)
Copied from [place] [id number]. Topic: Inquiry >> Jul 09, 2017  2:21 PM Conception Chancy, NT wrote: Reason for CRM: patient is calling and states she would like to pick up her results from ultrasound on 07/10/17. Please contact patient when ready for pick up.

## 2017-07-09 NOTE — Telephone Encounter (Signed)
Copied from [place] [id number]. Topic: Inquiry >> Jul 09, 2017  2:21 PM Conception Chancy, NT wrote: Reason for CRM: patient is calling and states she would like to pick up her results from ultrasound on 07/10/17. Please contact patient when ready for pick up.

## 2017-07-09 NOTE — Telephone Encounter (Signed)
Please advise 

## 2017-07-11 NOTE — Telephone Encounter (Signed)
Last note in pts charts states she did not show for the [place]

## 2017-07-24 DIAGNOSIS — Z79899 Other long term (current) drug therapy: Secondary | ICD-10-CM | POA: Diagnosis not present

## 2017-07-31 ENCOUNTER — Ambulatory Visit (INDEPENDENT_AMBULATORY_CARE_PROVIDER_SITE_OTHER): Payer: Medicare Other | Admitting: Family Medicine

## 2017-07-31 ENCOUNTER — Encounter: Payer: Self-pay | Admitting: Family Medicine

## 2017-07-31 VITALS — BP 102/78 | HR 70 | Temp 97.5°F | Resp 18 | Ht 68.0 in | Wt 153.0 lb

## 2017-07-31 DIAGNOSIS — R5383 Other fatigue: Secondary | ICD-10-CM | POA: Diagnosis not present

## 2017-07-31 DIAGNOSIS — F332 Major depressive disorder, recurrent severe without psychotic features: Secondary | ICD-10-CM

## 2017-07-31 DIAGNOSIS — G609 Hereditary and idiopathic neuropathy, unspecified: Secondary | ICD-10-CM | POA: Diagnosis not present

## 2017-07-31 DIAGNOSIS — D259 Leiomyoma of uterus, unspecified: Secondary | ICD-10-CM

## 2017-07-31 DIAGNOSIS — M5441 Lumbago with sciatica, right side: Secondary | ICD-10-CM | POA: Diagnosis not present

## 2017-07-31 MED ORDER — FLUOXETINE HCL 20 MG PO CAPS: 20.0000 mg | ORAL_CAPSULE | Freq: Every day | ORAL | 3 refills | Status: DC

## 2017-07-31 MED ORDER — PREDNISONE 50 MG PO TABS: ORAL_TABLET | ORAL | 0 refills | Status: DC

## 2017-07-31 MED ORDER — METHOCARBAMOL 500 MG PO TABS: 500.0000 mg | ORAL_TABLET | Freq: Four times a day (QID) | ORAL | 0 refills | Status: DC

## 2017-07-31 MED ORDER — QUETIAPINE FUMARATE 400 MG PO TABS: 400.0000 mg | ORAL_TABLET | Freq: Every day | ORAL | 3 refills | Status: DC

## 2017-07-31 MED ORDER — KETOROLAC TROMETHAMINE 60 MG/2ML IM SOLN
60.0000 mg | Freq: Once | INTRAMUSCULAR | Status: AC
Start: 2017-07-31 — End: 2017-07-31
  Administered 2017-07-31: 60 mg via INTRAMUSCULAR

## 2017-07-31 NOTE — Progress Notes (Signed)
    Subjective:  Laura [name] is a 47 y.o. female who presents today with a chief complaint of back pain.   HPI:  Back pain, acute problem Started 6 days ago.  Worsening over that time.  Located in right lower back.  Occasionally radiates into right leg.  Tried over-the-counter Advil and Tylenol without significant relief.  No bowel or bladder incontinence.  No fever or chills.  Little bit of numbness in her right leg.  Fatigue, new problem Started a few months ago.  Worsened recently.  Associated with mild weight loss.  No obvious precipitating events.  No treatments tried.  Depression/complicated bereavement Patient is coming up on the anniversary of her son's death.  She will be moving to [place] later this week.  She does not have any support systems aside from her boyfriend.  No reported SI or HI.  ROS: Per HPI  PMH: She reports that she has been smoking cigarettes.  She has a 20.00 pack-year smoking history. She has never used smokeless tobacco. She reports that she drinks alcohol. She reports that she has current or past drug history. Frequency: 2.00 times per week.   Objective:  Physical Exam: BP 102/78   Pulse 70   Temp (!) 97.5 F (36.4 C) (Oral)   Resp 18   Ht 5\' 8"  (1.727 m)   Wt 153 lb (69.4 kg)   SpO2 99%   BMI 23.26 kg/m   Gen: NAD, resting comfortably CV: RRR with no murmurs appreciated Pulm: NWOB, CTAB with no crackles, wheezes, or rhonchi MSK:  -Back: No deformities.  Tender to palpation along right lumbar paraspinal ribs.  No spinous process tenderness. -Legs: No deformities.  Strength 5 out of 5 throughout.  Sensation light touch intact throughout.  Patellar reflexes 2+ and symmetric bilaterally.  Straight leg raise test positive on the right. Skin: Warm, dry Neuro: Grossly normal, moves all extremities Psych: Normal affect and thought content  Assessment/Plan:  Acute low back pain with sciatica Likely muscular skeletal.  No red flag signs or  symptoms.  Will give 60 mg of IM Toradol today.  Start prednisone burst and muscle relaxer.  Discussed reasons to return to care.  Patient has follow-up already scheduled with a back specialist in [place] in approximately 2 weeks.  Fatigue/peripheral neuropathy Likely multifactorial.  Check CBC, CMET, TSH, and B12 levels today.  Uterine fibroids Patient requests referral to OB/GYN in [place].  This referral was placed today.  MDD (major depressive disorder), recurrent severe, without psychosis ([place]) Stable, however patient requests to be started on antidepressant today.  Discussed treatment options with patient and also discussed the fact that it would take a few weeks before any antidepressant would take effect.  Unfortunately patient will be moving later this week and we will be unable to follow-up.  We will start Prozac today 20 mg.  Advised patient to increase to 40 mg daily if tolerating well after a couple of weeks.  Also advised her to establish close follow-up with a PCP after she moves.  Algis Greenhouse. Jerline Pain, MD 07/31/2017 12:43 PM

## 2017-07-31 NOTE — Patient Instructions (Signed)
Is very nice to see you today!  I am sorry that you are not feeling well.  I wish you the best of luck on your move to [place].  We will start prednisone and Robaxin today.  We will also give you an injection of an anti-inflammatory.  Please start Prozac.  You can double up to 2 tablets daily after 1 to 2 weeks.  Will place referral to gynecologist in [place].  I will print after the prescriptions.  Please let me know or seek medical care if your symptoms worsen or do not improve over the next several days.  Take care, Dr Jerline Pain

## 2017-07-31 NOTE — Assessment & Plan Note (Signed)
Stable, however patient requests to be started on antidepressant today.  Discussed treatment options with patient and also discussed the fact that it would take a few weeks before any antidepressant would take effect.  Unfortunately patient will be moving later this week and we will be unable to follow-up.  We will start Prozac today 20 mg.  Advised patient to increase to 40 mg daily if tolerating well after a couple of weeks.  Also advised her to establish close follow-up with a PCP after she moves.

## 2017-07-31 NOTE — Addendum Note (Signed)
Addended by: Elmer Bales on: 07/31/2017 02:03 PM   Modules accepted: Orders

## 2017-08-10 DIAGNOSIS — H60391 Other infective otitis externa, right ear: Secondary | ICD-10-CM | POA: Diagnosis not present

## 2017-09-06 ENCOUNTER — Telehealth: Payer: Self-pay | Admitting: Family Medicine

## 2017-09-06 NOTE — Telephone Encounter (Signed)
Please advise 

## 2017-09-06 NOTE — Telephone Encounter (Signed)
Copied from [place] [id number]. Topic: General - Other >> Sep 06, 2017 11:47 AM Keene Breath wrote: Reason for CRM: Patient called to request a referral for a surgeon in [place], her new residence.  Dr. Lenon Ahmadi that she would need surgery and referred her to a doctor in [place] about a month ago.  Since she no longer lives there, it would be more convenient to get a Psychologist, sport and exercise in [place].  Patient is experiencing more pain and would like referral as soon as possible.  CB# [phone].

## 2017-09-09 NOTE — Telephone Encounter (Signed)
Patient has an appointment with OBGYN in [place] on 09/10/2017.

## 2017-09-09 NOTE — Telephone Encounter (Signed)
Ok to place another referral to [facility] for symptomatic fibroids.  Algis Greenhouse. Jerline Pain, MD 09/09/2017 8:24 AM

## 2017-09-10 DIAGNOSIS — B182 Chronic viral hepatitis C: Secondary | ICD-10-CM | POA: Diagnosis not present

## 2017-09-10 DIAGNOSIS — K5909 Other constipation: Secondary | ICD-10-CM | POA: Diagnosis not present

## 2017-09-10 DIAGNOSIS — Z79899 Other long term (current) drug therapy: Secondary | ICD-10-CM | POA: Diagnosis not present

## 2017-09-10 DIAGNOSIS — M545 Low back pain: Secondary | ICD-10-CM | POA: Diagnosis not present

## 2017-09-10 DIAGNOSIS — Z9889 Other specified postprocedural states: Secondary | ICD-10-CM | POA: Diagnosis not present

## 2017-10-20 DIAGNOSIS — F17200 Nicotine dependence, unspecified, uncomplicated: Secondary | ICD-10-CM | POA: Diagnosis not present

## 2017-10-20 DIAGNOSIS — R05 Cough: Secondary | ICD-10-CM | POA: Diagnosis not present

## 2017-10-20 DIAGNOSIS — R0789 Other chest pain: Secondary | ICD-10-CM | POA: Diagnosis not present

## 2017-10-20 DIAGNOSIS — R079 Chest pain, unspecified: Secondary | ICD-10-CM | POA: Diagnosis not present

## 2017-10-20 DIAGNOSIS — Z79899 Other long term (current) drug therapy: Secondary | ICD-10-CM | POA: Diagnosis not present

## 2017-10-20 DIAGNOSIS — F419 Anxiety disorder, unspecified: Secondary | ICD-10-CM | POA: Diagnosis not present

## 2017-10-20 DIAGNOSIS — F329 Major depressive disorder, single episode, unspecified: Secondary | ICD-10-CM | POA: Diagnosis not present

## 2017-10-20 DIAGNOSIS — R0602 Shortness of breath: Secondary | ICD-10-CM | POA: Diagnosis not present

## 2017-10-20 DIAGNOSIS — Z5321 Procedure and treatment not carried out due to patient leaving prior to being seen by health care provider: Secondary | ICD-10-CM | POA: Diagnosis not present

## 2017-12-03 DIAGNOSIS — M5416 Radiculopathy, lumbar region: Secondary | ICD-10-CM | POA: Diagnosis not present

## 2017-12-03 DIAGNOSIS — Z79891 Long term (current) use of opiate analgesic: Secondary | ICD-10-CM | POA: Diagnosis not present

## 2017-12-03 DIAGNOSIS — Z5181 Encounter for therapeutic drug level monitoring: Secondary | ICD-10-CM | POA: Diagnosis not present

## 2017-12-03 DIAGNOSIS — M47896 Other spondylosis, lumbar region: Secondary | ICD-10-CM | POA: Diagnosis not present

## 2017-12-03 DIAGNOSIS — M545 Low back pain: Secondary | ICD-10-CM | POA: Diagnosis not present

## 2017-12-03 DIAGNOSIS — M549 Dorsalgia, unspecified: Secondary | ICD-10-CM | POA: Diagnosis not present

## 2017-12-10 DIAGNOSIS — M4727 Other spondylosis with radiculopathy, lumbosacral region: Secondary | ICD-10-CM | POA: Diagnosis not present

## 2017-12-10 DIAGNOSIS — M5136 Other intervertebral disc degeneration, lumbar region: Secondary | ICD-10-CM | POA: Diagnosis not present

## 2017-12-10 DIAGNOSIS — M419 Scoliosis, unspecified: Secondary | ICD-10-CM | POA: Diagnosis not present

## 2017-12-10 DIAGNOSIS — M4726 Other spondylosis with radiculopathy, lumbar region: Secondary | ICD-10-CM | POA: Diagnosis not present

## 2017-12-10 DIAGNOSIS — Z981 Arthrodesis status: Secondary | ICD-10-CM | POA: Diagnosis not present

## 2017-12-10 DIAGNOSIS — M5116 Intervertebral disc disorders with radiculopathy, lumbar region: Secondary | ICD-10-CM | POA: Diagnosis not present

## 2017-12-10 DIAGNOSIS — M47816 Spondylosis without myelopathy or radiculopathy, lumbar region: Secondary | ICD-10-CM | POA: Diagnosis not present

## 2017-12-10 DIAGNOSIS — M5117 Intervertebral disc disorders with radiculopathy, lumbosacral region: Secondary | ICD-10-CM | POA: Diagnosis not present

## 2017-12-16 DIAGNOSIS — J42 Unspecified chronic bronchitis: Secondary | ICD-10-CM | POA: Diagnosis not present

## 2017-12-16 DIAGNOSIS — F419 Anxiety disorder, unspecified: Secondary | ICD-10-CM | POA: Diagnosis not present

## 2017-12-16 DIAGNOSIS — F4310 Post-traumatic stress disorder, unspecified: Secondary | ICD-10-CM | POA: Diagnosis not present

## 2017-12-16 DIAGNOSIS — F17210 Nicotine dependence, cigarettes, uncomplicated: Secondary | ICD-10-CM | POA: Diagnosis not present

## 2017-12-16 DIAGNOSIS — R0789 Other chest pain: Secondary | ICD-10-CM | POA: Diagnosis not present

## 2017-12-16 DIAGNOSIS — Z79899 Other long term (current) drug therapy: Secondary | ICD-10-CM | POA: Diagnosis not present

## 2017-12-16 DIAGNOSIS — R079 Chest pain, unspecified: Secondary | ICD-10-CM | POA: Diagnosis not present

## 2017-12-16 DIAGNOSIS — F329 Major depressive disorder, single episode, unspecified: Secondary | ICD-10-CM | POA: Diagnosis not present

## 2017-12-16 DIAGNOSIS — J449 Chronic obstructive pulmonary disease, unspecified: Secondary | ICD-10-CM | POA: Diagnosis not present

## 2017-12-27 DIAGNOSIS — R1013 Epigastric pain: Secondary | ICD-10-CM | POA: Diagnosis not present

## 2017-12-27 DIAGNOSIS — R109 Unspecified abdominal pain: Secondary | ICD-10-CM | POA: Diagnosis not present

## 2017-12-27 DIAGNOSIS — K8020 Calculus of gallbladder without cholecystitis without obstruction: Secondary | ICD-10-CM | POA: Diagnosis not present

## 2018-01-13 ENCOUNTER — Encounter: Payer: Self-pay | Admitting: Family Medicine

## 2018-01-13 DIAGNOSIS — Z0289 Encounter for other administrative examinations: Secondary | ICD-10-CM

## 2018-01-15 ENCOUNTER — Encounter: Payer: Self-pay | Admitting: Family Medicine

## 2018-02-01 DIAGNOSIS — T50902A Poisoning by unspecified drugs, medicaments and biological substances, intentional self-harm, initial encounter: Secondary | ICD-10-CM | POA: Diagnosis not present

## 2018-02-02 DIAGNOSIS — T50992A Poisoning by other drugs, medicaments and biological substances, intentional self-harm, initial encounter: Secondary | ICD-10-CM

## 2018-02-02 DIAGNOSIS — T43211A Poisoning by selective serotonin and norepinephrine reuptake inhibitors, accidental (unintentional), initial encounter: Secondary | ICD-10-CM | POA: Diagnosis not present

## 2018-02-02 DIAGNOSIS — N390 Urinary tract infection, site not specified: Secondary | ICD-10-CM | POA: Insufficient documentation

## 2018-02-02 DIAGNOSIS — R4020 Unspecified coma: Secondary | ICD-10-CM | POA: Diagnosis not present

## 2018-02-02 DIAGNOSIS — T50902A Poisoning by unspecified drugs, medicaments and biological substances, intentional self-harm, initial encounter: Secondary | ICD-10-CM

## 2018-02-02 HISTORY — DX: Poisoning by unspecified drugs, medicaments and biological substances, intentional self-harm, initial encounter: T50.902A

## 2018-02-03 DIAGNOSIS — T43211A Poisoning by selective serotonin and norepinephrine reuptake inhibitors, accidental (unintentional), initial encounter: Secondary | ICD-10-CM | POA: Diagnosis not present

## 2018-02-04 DIAGNOSIS — T50992A Poisoning by other drugs, medicaments and biological substances, intentional self-harm, initial encounter: Secondary | ICD-10-CM | POA: Diagnosis not present

## 2018-02-05 DIAGNOSIS — T43211A Poisoning by selective serotonin and norepinephrine reuptake inhibitors, accidental (unintentional), initial encounter: Secondary | ICD-10-CM | POA: Diagnosis not present

## 2018-02-06 DIAGNOSIS — T43211A Poisoning by selective serotonin and norepinephrine reuptake inhibitors, accidental (unintentional), initial encounter: Secondary | ICD-10-CM | POA: Diagnosis not present

## 2018-02-07 DIAGNOSIS — T43211A Poisoning by selective serotonin and norepinephrine reuptake inhibitors, accidental (unintentional), initial encounter: Secondary | ICD-10-CM | POA: Diagnosis not present

## 2018-02-08 DIAGNOSIS — T43211A Poisoning by selective serotonin and norepinephrine reuptake inhibitors, accidental (unintentional), initial encounter: Secondary | ICD-10-CM | POA: Diagnosis not present

## 2018-02-09 DIAGNOSIS — T43211A Poisoning by selective serotonin and norepinephrine reuptake inhibitors, accidental (unintentional), initial encounter: Secondary | ICD-10-CM | POA: Diagnosis not present

## 2018-02-10 DIAGNOSIS — N390 Urinary tract infection, site not specified: Secondary | ICD-10-CM | POA: Diagnosis not present

## 2018-02-10 DIAGNOSIS — T43211A Poisoning by selective serotonin and norepinephrine reuptake inhibitors, accidental (unintentional), initial encounter: Secondary | ICD-10-CM | POA: Diagnosis not present

## 2018-02-10 DIAGNOSIS — T1491XA Suicide attempt, initial encounter: Secondary | ICD-10-CM | POA: Diagnosis not present

## 2018-02-10 DIAGNOSIS — G40909 Epilepsy, unspecified, not intractable, without status epilepticus: Secondary | ICD-10-CM | POA: Diagnosis not present

## 2018-02-11 DIAGNOSIS — T43211A Poisoning by selective serotonin and norepinephrine reuptake inhibitors, accidental (unintentional), initial encounter: Secondary | ICD-10-CM | POA: Diagnosis not present

## 2018-03-20 ENCOUNTER — Encounter: Payer: Self-pay | Admitting: Family Medicine

## 2018-03-20 ENCOUNTER — Ambulatory Visit (INDEPENDENT_AMBULATORY_CARE_PROVIDER_SITE_OTHER): Payer: Medicare Other | Admitting: Family Medicine

## 2018-03-20 VITALS — BP 118/70 | HR 65 | Temp 97.9°F | Ht 68.0 in | Wt 160.4 lb

## 2018-03-20 DIAGNOSIS — B182 Chronic viral hepatitis C: Secondary | ICD-10-CM

## 2018-03-20 DIAGNOSIS — F419 Anxiety disorder, unspecified: Secondary | ICD-10-CM

## 2018-03-20 DIAGNOSIS — L723 Sebaceous cyst: Secondary | ICD-10-CM | POA: Diagnosis not present

## 2018-03-20 DIAGNOSIS — F332 Major depressive disorder, recurrent severe without psychotic features: Secondary | ICD-10-CM

## 2018-03-20 DIAGNOSIS — D259 Leiomyoma of uterus, unspecified: Secondary | ICD-10-CM

## 2018-03-20 DIAGNOSIS — H9191 Unspecified hearing loss, right ear: Secondary | ICD-10-CM

## 2018-03-20 DIAGNOSIS — F1920 Other psychoactive substance dependence, uncomplicated: Secondary | ICD-10-CM

## 2018-03-20 DIAGNOSIS — G894 Chronic pain syndrome: Secondary | ICD-10-CM

## 2018-03-20 HISTORY — DX: Sebaceous cyst: L72.3

## 2018-03-20 MED ORDER — CLONIDINE HCL 0.1 MG PO TABS: 0.1000 mg | ORAL_TABLET | Freq: Every day | ORAL | 11 refills | Status: DC

## 2018-03-20 MED ORDER — TRAZODONE HCL 100 MG PO TABS: 100.0000 mg | ORAL_TABLET | Freq: Every evening | ORAL | 11 refills | Status: DC | PRN

## 2018-03-20 MED ORDER — MELOXICAM 15 MG PO TABS: 15.0000 mg | ORAL_TABLET | Freq: Every day | ORAL | 3 refills | Status: DC

## 2018-03-20 MED ORDER — QUETIAPINE FUMARATE 400 MG PO TABS: 400.0000 mg | ORAL_TABLET | Freq: Every day | ORAL | 3 refills | Status: DC

## 2018-03-20 NOTE — Assessment & Plan Note (Signed)
Gave contact information for infectious disease clinic.

## 2018-03-20 NOTE — Assessment & Plan Note (Signed)
No red flags.  Mobic refilled.

## 2018-03-20 NOTE — Assessment & Plan Note (Signed)
-  Referral to audiology placed °

## 2018-03-20 NOTE — Assessment & Plan Note (Signed)
Re-placed referral to gynecology.

## 2018-03-20 NOTE — Assessment & Plan Note (Signed)
Continue Cymbalta 60 mg daily, Seroquel 400 mg daily, and trazodone.  She is not a candidate for benzos given substance abuse history.  She has tried several other anxiolytics with no improvement.  Will start low-dose clonidine 0.1 mg daily.  She will follow-up with psychiatry for further management.  Discussed reasons to return to care and seek emergent care.

## 2018-03-20 NOTE — Assessment & Plan Note (Signed)
Encouraged continued cessation.  She just finished detox program.  She will continue using Subutex.  Will place referral to psychiatry as noted above.

## 2018-03-20 NOTE — Patient Instructions (Addendum)
It was very nice to see you today!  I will refill your medications.  Please start clonidine for your anxiety.  I will place a referral to the gynecologist, audiologist, and psychiatrist.  Please call infectious disease to get treatment for hepatitis C.  Take care, Dr Jerline Pain

## 2018-03-20 NOTE — Assessment & Plan Note (Signed)
Overall, her depressive symptoms seem to be stable.  She will continue Cymbalta 60 mg daily and Seroquel 400 mg daily.  She will also continue trazodone 100 mg at night as needed.  Will place referral to psychiatry today.

## 2018-03-20 NOTE — Progress Notes (Signed)
Subjective:  Laura [name] is a 48 y.o. female who presents today with a chief complaint of depression.   HPI:   Depression / Anxiety / Substance abuse Patient is currently on Seroquel 400 mg nightly, Cymbalta 60 mg daily, and trazodone 100 mg at night for insomnia.  Overall feels like her symptoms are stable.  She recently moved back to [place] from [place].  She was living with her fianc and [place] Fordyce however they unfortunately split up and she has been back in [place] for the past week or so.  Patient states that she was in a "dark place" for a couple months after the break-up.  She recently completed inpatient rehab for IV drug use about a week ago.  This happened in [place].  She feels like she is doing much better now.  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  Now that she is back in [place], she would like to be reestablished with a psychiatrist in the area.  Denies any SI or HI.  Overall feels like her mood is stable, however has had worsened anxiety symptoms.  She has tried several anxiolytics in the past including BuSpar, Lyrica, and hydroxyzine with no improvement.  Due to her substance abuse history, she is not a candidate for benzodiazepines.  She has a therapist in the area who reportedly told her that she should be evaluated for bipolar.  Patient denies any prior manic episodes.  Chronic pelvic pain/hydrosalpinx/fibroids This was confirmed on ultrasound earlier this year.  She was referred to gynecology, however due to her recent move and other mental health issues, she has not yet followed up with gynecology.  She would like to be rereferred today.  Her pain is stable.  Chronic pain secondary to degenerative disc disease and lumbar spondylosis She is currently on meloxicam 15 mg daily which helps with her symptoms.  No weakness or numbness.  No bowel or bladder incontinence.  Right ear hearing loss She has had issues with hearing out  of her right ear since she was young.  She would like referral to an audiologist to discuss treatment options.  Chronic hepatitis C Patient was also found to be positive for hep C earlier this year prior to her moving to [place].  She has not followed up with infectious disease clinic.  She is interested in following back up with them to be started on treatment.  ROS: Per HPI  PMH: She reports that she has been smoking cigarettes. She has a 20.00 pack-year smoking history. She has never used smokeless tobacco. She reports current alcohol use. She reports previous drug use. Frequency: 2.00 times per week.  Objective:  Physical Exam: BP 118/70 (BP Location: Left Arm, Patient Position: Sitting, Cuff Size: Normal)   Pulse 65   Temp 97.9 F (36.6 C) (Oral)   Ht 5\' 8"  (1.727 m)   Wt 160 lb 6.4 oz (72.8 kg)   SpO2 98%   BMI 24.39 kg/m   Gen: NAD, resting comfortably HEENT: Approximately 1.5 cm cystic lesion on right anterior scalp. MSK: No edema, cyanosis, or clubbing noted Skin: Warm, dry Neuro: Grossly normal, moves all extremities Psych: Normal affect and thought content  Assessment/Plan:  Uterine leiomyoma Re-placed referral to gynecology.  Sebaceous cyst No red flags.  She will continue with watchful waiting.  MDD (major depressive disorder), recurrent severe, without psychosis ([place]) Overall, her depressive symptoms seem to be stable.  She will continue Cymbalta 60 mg daily and  Seroquel 400 mg daily.  She will also continue trazodone 100 mg at night as needed.  Will place referral to psychiatry today.  IV Drug addiction ([place]) Encouraged continued cessation.  She just finished detox program.  She will continue using Subutex.  Will place referral to psychiatry as noted above.  Decreased hearing of right ear Referral to audiology placed.  Chronic pain syndrome secondary to DDD and lumbar spondylosis  No red flags.  Mobic refilled.  Chronic hepatitis C ([place]) Gave contact  information for infectious disease clinic.  Anxiety Continue Cymbalta 60 mg daily, Seroquel 400 mg daily, and trazodone.  She is not a candidate for benzos given substance abuse history.  She has tried several other anxiolytics with no improvement.  Will start low-dose clonidine 0.1 mg daily.  She will follow-up with psychiatry for further management.  Discussed reasons to return to care and seek emergent care.   Algis Greenhouse. Jerline Pain, MD 03/20/2018 11:53 AM

## 2018-03-20 NOTE — Assessment & Plan Note (Signed)
No red flags.  She will continue with watchful waiting.

## 2018-03-21 ENCOUNTER — Telehealth: Payer: Self-pay | Admitting: Family Medicine

## 2018-03-21 NOTE — Telephone Encounter (Signed)
See note  Copied from [place] [id number]. Topic: Referral - Request for Referral >> Mar 21, 2018  2:28 PM Selinda Flavin B, [place] wrote: Has patient seen PCP for this complaint? No. *If NO, is insurance requiring patient see PCP for this issue before PCP can refer them? She does not know Referral for which specialty: Infectious disease Preferred provider/office: Infectious disease Fax#: [phone] Reason for referral: Patient needs to see them. Did not go into details.

## 2018-03-24 ENCOUNTER — Other Ambulatory Visit: Payer: Self-pay

## 2018-03-24 DIAGNOSIS — B182 Chronic viral hepatitis C: Secondary | ICD-10-CM

## 2018-03-24 NOTE — Telephone Encounter (Signed)
Referral has been placed. 

## 2018-03-25 ENCOUNTER — Other Ambulatory Visit: Payer: Self-pay

## 2018-03-25 ENCOUNTER — Encounter (HOSPITAL_COMMUNITY): Payer: Self-pay

## 2018-03-25 ENCOUNTER — Emergency Department (HOSPITAL_COMMUNITY)
Admission: EM | Admit: 2018-03-25 | Discharge: 2018-03-25 | Disposition: A | Discharge status: Home / Self Care | Payer: Medicare Other | Attending: Emergency Medicine | Admitting: Emergency Medicine

## 2018-03-25 DIAGNOSIS — R42 Dizziness and giddiness: Secondary | ICD-10-CM

## 2018-03-25 DIAGNOSIS — R55 Syncope and collapse: Secondary | ICD-10-CM | POA: Diagnosis not present

## 2018-03-25 DIAGNOSIS — F17210 Nicotine dependence, cigarettes, uncomplicated: Secondary | ICD-10-CM | POA: Diagnosis not present

## 2018-03-25 DIAGNOSIS — E860 Dehydration: Secondary | ICD-10-CM

## 2018-03-25 DIAGNOSIS — Z79899 Other long term (current) drug therapy: Secondary | ICD-10-CM | POA: Diagnosis not present

## 2018-03-25 LAB — BASIC METABOLIC PANEL
ANION GAP: 9 (ref 5–15)
BUN: 13 mg/dL (ref 6–20)
CO2: 27 mmol/L (ref 22–32)
Calcium: 8.9 mg/dL (ref 8.9–10.3)
Chloride: 99 mmol/L (ref 98–111)
Creatinine, Ser: 0.88 mg/dL (ref 0.44–1.00)
GFR calc Af Amer: >60 mL/min (ref >60)
GFR calc non Af Amer: >60 mL/min (ref >60)
Glucose, Bld: 103 mg/dL — ABNORMAL HIGH (ref 70–99)
Potassium: 4.2 mmol/L (ref 3.5–5.1)
Sodium: 135 mmol/L (ref 135–145)

## 2018-03-25 LAB — CBG MONITORING, ED: Glucose-Capillary: 110 mg/dL — ABNORMAL HIGH (ref 70–99)

## 2018-03-25 LAB — CBC
HCT: 44.9 % (ref 36.0–46.0)
Hemoglobin: 14.3 g/dL (ref 12.0–15.0)
MCH: 31.8 pg (ref 26.0–34.0)
MCHC: 31.8 g/dL (ref 30.0–36.0)
MCV: 100 fL (ref 80.0–100.0)
Platelets: 250 10*3/uL (ref 150–400)
RBC: 4.49 MIL/uL (ref 3.87–5.11)
RDW: 13.1 % (ref 11.5–15.5)
WBC: 10.3 10*3/uL (ref 4.0–10.5)
nRBC: 0 % (ref 0.0–0.2)

## 2018-03-25 LAB — URINALYSIS, ROUTINE W REFLEX MICROSCOPIC
BILIRUBIN URINE: NEGATIVE
Glucose, UA: NEGATIVE mg/dL
Hgb urine dipstick: NEGATIVE
Ketones, ur: NEGATIVE mg/dL
Leukocytes, UA: NEGATIVE
Nitrite: NEGATIVE
Protein, ur: NEGATIVE mg/dL
Specific Gravity, Urine: 1.004 — ABNORMAL LOW (ref 1.005–1.030)
pH: 6 (ref 5.0–8.0)

## 2018-03-25 LAB — MAGNESIUM: Magnesium: 1.8 mg/dL (ref 1.7–2.4)

## 2018-03-25 LAB — HEPATIC FUNCTION PANEL
ALT: 37 U/L (ref 0–44)
AST: 46 U/L — ABNORMAL HIGH (ref 15–41)
Albumin: 4.1 g/dL (ref 3.5–5.0)
Alkaline Phosphatase: 127 U/L — ABNORMAL HIGH (ref 38–126)
Bilirubin, Direct: 0.2 mg/dL (ref 0.0–0.2)
Indirect Bilirubin: 0.4 mg/dL (ref 0.3–0.9)
Total Bilirubin: 0.6 mg/dL (ref 0.3–1.2)
Total Protein: 7.1 g/dL (ref 6.5–8.1)

## 2018-03-25 LAB — TSH: TSH: 4.838 u[IU]/mL — ABNORMAL HIGH (ref 0.350–4.500)

## 2018-03-25 LAB — POC URINE PREG, ED: Preg Test, Ur: NEGATIVE

## 2018-03-25 MED ORDER — SODIUM CHLORIDE 0.9 % IV BOLUS
1000.0000 mL | Freq: Once | INTRAVENOUS | Status: AC
  Administered 2018-03-25: 1000 mL via INTRAVENOUS

## 2018-03-25 NOTE — Discharge Instructions (Signed)
Your history and exam today are consistent with dehydration likely leading to your multiple syncopal episodes.  The work-up today showed slight elevation in your TSH for your thyroid, please follow-up with your primary doctor to have this rechecked.  Your other work-up was overall reassuring.  Given your resolution of symptoms with the fluids, we feel you are safe for discharge home.  Please push hydration and try to rest.  Please return to the nearest emergency department if any symptoms change worsen or recur.

## 2018-03-25 NOTE — ED Notes (Signed)
RN aware of pts sats

## 2018-03-25 NOTE — ED Provider Notes (Signed)
[facility] DEPT Provider Note   CSN: [id number] Arrival date & time: 03/25/18  1435     History   Chief Complaint Chief Complaint  Patient presents with  . Loss of Consciousness    HPI Ethelene Closser is a 48 y.o. female.  The history is provided by the patient. No language interpreter was used.  Loss of Consciousness  Episode history:  Multiple Most recent episode:  Today Timing:  Sporadic Progression:  Waxing and waning Chronicity:  New Context: dehydration and standing up   Witnessed: yes   Relieved by:  Lying down Worsened by:  Nothing Ineffective treatments:  None tried Associated symptoms: chest pain   Associated symptoms: no diaphoresis, no dizziness, no fever, no headaches, no malaise/fatigue, no nausea, no recent fall, no shortness of breath, no visual change and no vomiting     Past Medical History:  Diagnosis Date  . Anxiety   . Depression   . ETOH abuse   . Hepatitis C   . Substance abuse [facility])     Patient Active Problem List   Diagnosis Date Noted  . Anxiety 03/20/2018  . Sebaceous cyst 03/20/2018  . Decreased hearing of right ear 03/20/2018  . Cigarette smoker 05/28/2017  . IV Drug addiction ([place]) 05/27/2017  . Uterine leiomyoma 04/18/2017  . Chronic hepatitis C ([place]) 03/29/2017  . 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 Insomnia 03/29/2017  . PTSD (post-traumatic stress disorder) 08/12/2016  . History of suicide attempt   . Alcohol use disorder, severe, dependence ([place]) 07/23/2016  . Prolonged Q-T interval on ECG 07/23/2016  . MDD (major depressive disorder), recurrent severe, without psychosis ([place]) 07/21/2016    Past Surgical History:  Procedure Laterality Date  . BACK SURGERY    . KNEE SURGERY       OB History   No obstetric history on file.      Home Medications    Prior to Admission medications   Medication Sig Start Date End Date Taking? Authorizing  Provider  Buprenorphine HCl (SUBUTEX SL) Place 12 mg under the tongue daily.    [provider]  cloNIDine (CATAPRES) 0.1 MG tablet Take 1 tablet (0.1 mg total) by mouth daily. 03/20/18   Vivi Barrack, MD  DULoxetine (CYMBALTA) 60 MG capsule Take 60 mg by mouth daily.    [provider]  meloxicam (MOBIC) 15 MG tablet Take 1 tablet (15 mg total) by mouth daily. 03/20/18   Vivi Barrack, MD  QUEtiapine (SEROQUEL) 400 MG tablet Take 1 tablet (400 mg total) by mouth at bedtime. 03/20/18   Vivi Barrack, MD  traZODone (DESYREL) 100 MG tablet Take 1 tablet (100 mg total) by mouth at bedtime as needed for sleep. For sleep 03/20/18   Vivi Barrack, MD    Family History Family History  Family history unknown: Yes    Social History Social History   Tobacco Use  . Smoking status: Current Every Day Smoker    Packs/day: 0.50    Years: 20.00    Pack years: 10.00    Types: Cigarettes  . Smokeless tobacco: Never Used  Substance Use Topics  . Alcohol use: Not Currently    Comment: occassionally  . Drug use: Not Currently    Frequency: 2.0 times per week     Allergies   Patient has no known allergies.   Review of Systems Review of Systems  Constitutional: Negative for chills, diaphoresis, fatigue,  fever and malaise/fatigue.  HENT: Negative for congestion.   Respiratory: Negative for cough and shortness of breath.   Cardiovascular: Positive for chest pain and syncope.  Gastrointestinal: Negative for constipation, diarrhea, nausea and vomiting.  Genitourinary: Negative for dysuria.  Musculoskeletal: Positive for back pain (chronic). Negative for neck pain and neck stiffness.  Skin: Negative for rash and wound.  Neurological: Positive for syncope and light-headedness. Negative for dizziness, numbness and headaches.  Psychiatric/Behavioral: Negative for agitation.  All other systems reviewed and are negative.    Physical Exam Updated Vital Signs BP (!) 85/45 (BP  Location: Left Arm)   Pulse 66   Resp 16   Ht 5' 8.5" (1.74 m)   Wt 72.6 kg   SpO2 100%   BMI 23.97 kg/m   Physical Exam Vitals signs and nursing note reviewed.  Constitutional:      General: She is not in acute distress.    Appearance: She is well-developed. She is not ill-appearing, toxic-appearing or diaphoretic.  HENT:     Head: Normocephalic and atraumatic.     Nose: Nose normal. No congestion or rhinorrhea.     Mouth/Throat:     Pharynx: No oropharyngeal exudate.  Eyes:     Conjunctiva/sclera: Conjunctivae normal.  Neck:     Musculoskeletal: Neck supple. No neck rigidity.  Cardiovascular:     Rate and Rhythm: Regular rhythm. Bradycardia present.     Pulses: Normal pulses.     Heart sounds: No murmur.  Pulmonary:     Effort: Pulmonary effort is normal. No respiratory distress.     Breath sounds: Normal breath sounds. No wheezing, rhonchi or rales.  Chest:     Chest wall: No tenderness.  Abdominal:     Palpations: Abdomen is soft.     Tenderness: There is no abdominal tenderness. There is no guarding.  Musculoskeletal:        General: No tenderness or signs of injury.     Right lower leg: No edema.     Left lower leg: No edema.  Skin:    General: Skin is warm and dry.  Neurological:     General: No focal deficit present.     Mental Status: She is alert and oriented to person, place, and time.     Cranial Nerves: No cranial nerve deficit.     Sensory: No sensory deficit.     Motor: No weakness.     Coordination: Coordination normal.  Psychiatric:        Mood and Affect: Mood normal.      ED Treatments / Results  Labs (all labs ordered are listed, but only abnormal results are displayed) Labs Reviewed  BASIC METABOLIC PANEL - Abnormal; Notable for the following components:      Result Value   Glucose, Bld 103 (*)    All other components within normal limits  URINALYSIS, ROUTINE W REFLEX MICROSCOPIC - Abnormal; Notable for the following components:    Specific Gravity, Urine 1.004 (*)    All other components within normal limits  HEPATIC FUNCTION PANEL - Abnormal; Notable for the following components:   AST 46 (*)    Alkaline Phosphatase 127 (*)    All other components within normal limits  TSH - Abnormal; Notable for the following components:   TSH 4.838 (*)    All other components within normal limits  CBG MONITORING, ED - Abnormal; Notable for the following components:   Glucose-Capillary 110 (*)    All other components within normal  limits  URINE CULTURE  CBC  MAGNESIUM  POC URINE PREG, ED    EKG EKG Interpretation  Date/Time:  Tuesday March 25 2018 14:49:59 EST Ventricular Rate:  71 PR Interval:    QRS Duration: 90 QT Interval:  425 QTC Calculation: 462 R Axis:   88 Text Interpretation:  Sinus rhythm Borderline T abnormalities, anterior leads When compared to prior, no significant cahnges seen.  No STEMI Confirmed by Antony Blackbird [phone]) on 03/25/2018 3:15:31 PM   Radiology No results found.  Procedures Procedures (including critical care time)  Medications Ordered in ED Medications  sodium chloride 0.9 % bolus 1,000 mL (1,000 mLs Intravenous New Bag/Given 03/25/18 1516)  sodium chloride 0.9 % bolus 1,000 mL (1,000 mLs Intravenous New Bag/Given 03/25/18 1546)     Initial Impression / Assessment and Plan / ED Course  I have reviewed the triage vital signs and the nursing notes.  Pertinent labs & imaging results that were available during my care of the patient were reviewed by me and considered in my medical decision making (see chart for details).     Willetta York is a 48 y.o. female with a past medical history significant for depression, PTSD, polysubstance abuse, and hepatitis C who presents with lightheadedness, feeling dehydrated, and syncopal episodes.  Patient reports that this week, she has had approximately 10 syncopal episodes.  She reports that they are typically in the context of standing up or  sitting up.  She says that she did not pass out yesterday but all week had approximately 8 episodes.  She reports that today she had 2 episodes both after standing up.  She says that she has never had passing out episodes before.  She denies any palpitations or shortness of breath in the context of them but she does report she had some chest discomfort 1 of the time she passed out.  Not today.  She reports that she gets lightheaded and her vision goes dark and then she passes out for several seconds.  1 of them was witnessed by her father and he says she only passed out for a second.  She reports that she has been drinking what she thinks is normal fluids but she did start drinking coffee last month.  She has been having urinary frequency but denies any dysuria or sensation of UTI.  She denies leg pain or leg swelling, no history of DVT or PE.  She denies any history of cardiac disease.  She denies any recent trauma.  She denies any fevers, chills congestion, or cough.  Denies any abdominal pain back pain or flank pain over her chronic back pains.  She does report she takes multiple medications and has recently started taking clonidine as well.  She reports that she was already having syncopal episodes before clonidine was started this week.  On exam, lungs are clear.  Chest is nontender.  Abdomen is nontender.  No focal neurologic deficits.  Patient has no murmur.  No leg tenderness or swelling.  EKG shows no evidence of STEMI.  No arrhythmia seen.  Clinical aspect patient is dehydrated with dry mucous membranes contributing to her syncopal episodes.  Blood pressure was in the 80s on arrival, she will be given fluids.  Patient work-up to look for occult urinary infection given the frequency or other abnormalities.  We will have a shared decision making conversation with patient as to disposition given multiple syncopal episodes and hypotension in the setting of syncope however I suspect is  dehydration and  the patient feels better she may be a candidate for discharge home.  5:53 PM Patient reportedly much better after 2 L of fluid.  Patient will to ambulate and walk without any lightheadedness near syncope or syncopal episodes.  She was able to eat and drink without problems.  Laboratory testing was overall reassuring.  No evidence of UTI.  Hepatic function showed slight elevation in AST and alk phos but no abdominal pain or tenderness.  TSH was slightly elevated, do not feel that hypothyroidism is the cause of her symptoms.  Suspect dehydration.  Patient will follow-up with her PCP for repeat TSH evaluation.  Shared decision making conversation was held with patient.  We discussed that it would not be unreasonable to consider admission given the multiple syncopal episodes, transient chest discomfort she had, and her low blood pressure however as we suspect this is primarily dehydration and the fact that she has had resolved symptoms and wants to get home to her son's 21st birthday, we feel she is safe for discharge home.  Patient understands that she needs to come back to the nearest emergency department if any symptoms recur or worsen.  Patient will follow-up with her PCP in several days.  Patient other questions or concerns and was discharged in good condition with resolved symptoms.   Final Clinical Impressions(s) / ED Diagnoses   Final diagnoses:  Syncope, unspecified syncope type  Dehydration  Lightheadedness    ED Discharge Orders    None      Clinical Impression: 1. Syncope, unspecified syncope type   2. Dehydration   3. Lightheadedness     Disposition: Discharge  Condition: Good  I have discussed the results, Dx and Tx plan with the pt(& family if present). He/she/they expressed understanding and agree(s) with the plan. Discharge instructions discussed at great length. Strict return precautions discussed and pt &/or family have verbalized understanding of the instructions.  No further questions at time of discharge.    New Prescriptions   No medications on file    Follow Up: Vivi Barrack, [place] [address] [facility] DEPT [address] [id number] [place]        , Gwenyth Allegra, MD 03/25/18 [phone]

## 2018-03-25 NOTE — ED Triage Notes (Signed)
Patient states she has been having LOC x 1 week and twice today. BP-85/65 in triage.

## 2018-03-25 NOTE — ED Notes (Signed)
Pt informed of need for urine sample.  Pt stated unable to provide one at this time.

## 2018-03-26 LAB — URINE CULTURE: Culture: NO GROWTH

## 2018-03-28 ENCOUNTER — Telehealth: Payer: Self-pay | Admitting: Family Medicine

## 2018-03-28 NOTE — Telephone Encounter (Signed)
Spoke with Laura [name].  As noted below, patient needs to contact [facility] and request that her records be sent to [facility].  Our office cannot do that for her.  LM for patient to return call.  CRM placed.

## 2018-03-28 NOTE — Telephone Encounter (Signed)
Our office would have nothing to do with records from one office being released to another office.  The patient may have misunderstood, but she will need to contact the [facility] in [place], and may need to sign a HIPAA release with them in order to have the records sent to [facility].  Her PCP would not be involved in this transaction.

## 2018-03-28 NOTE — Telephone Encounter (Signed)
Copied from [place] [id number]. Topic: General - Other >> Mar 28, 2018 11:16 AM Sheran Luz wrote: Patient called stating that in order for her imaging results to be moved from [facility] to [facility], she was advised that she would need approval from PCP. Patient then stated that her son was calling and had to disconnect call before I could gather any further information.  Please advise.   Patient states that Amador Cunas can be contacted for further questions Phone# [phone]

## 2018-03-31 ENCOUNTER — Encounter: Payer: Medicare Other | Admitting: Obstetrics and Gynecology

## 2018-04-01 DIAGNOSIS — H90A22 Sensorineural hearing loss, unilateral, left ear, with restricted hearing on the contralateral side: Secondary | ICD-10-CM | POA: Diagnosis not present

## 2018-04-01 DIAGNOSIS — H90A31 Mixed conductive and sensorineural hearing loss, unilateral, right ear with restricted hearing on the contralateral side: Secondary | ICD-10-CM | POA: Diagnosis not present

## 2018-04-02 ENCOUNTER — Encounter: Payer: Medicare Other | Admitting: Infectious Diseases

## 2018-04-02 ENCOUNTER — Encounter: Payer: Self-pay | Admitting: Family Medicine

## 2018-04-02 ENCOUNTER — Ambulatory Visit (INDEPENDENT_AMBULATORY_CARE_PROVIDER_SITE_OTHER): Payer: Medicare Other | Admitting: Family Medicine

## 2018-04-02 ENCOUNTER — Telehealth: Payer: Self-pay | Admitting: Family Medicine

## 2018-04-02 ENCOUNTER — Encounter: Payer: Self-pay | Admitting: Neurology

## 2018-04-02 VITALS — BP 112/62 | HR 61 | Temp 97.8°F | Ht 68.0 in | Wt 169.4 lb

## 2018-04-02 DIAGNOSIS — G894 Chronic pain syndrome: Secondary | ICD-10-CM | POA: Diagnosis not present

## 2018-04-02 DIAGNOSIS — R569 Unspecified convulsions: Secondary | ICD-10-CM | POA: Diagnosis not present

## 2018-04-02 DIAGNOSIS — R32 Unspecified urinary incontinence: Secondary | ICD-10-CM

## 2018-04-02 MED ORDER — METHYLPREDNISOLONE ACETATE 80 MG/ML IJ SUSP: 80.0000 mg | Freq: Once | INTRAMUSCULAR | Status: DC

## 2018-04-02 MED ORDER — TIZANIDINE HCL 4 MG PO TABS: 4.0000 mg | ORAL_TABLET | Freq: Four times a day (QID) | ORAL | 0 refills | Status: DC | PRN

## 2018-04-02 MED ORDER — MELOXICAM 15 MG PO TABS: 15.0000 mg | ORAL_TABLET | Freq: Every day | ORAL | 3 refills | Status: DC

## 2018-04-02 NOTE — Assessment & Plan Note (Addendum)
Will obtain most recent imaging results from [facility].  Place referral to neurosurgery to discuss management options.  Will refill Zanaflex and Mobic today.  Discussed reasons to return to care or seek emergent care.

## 2018-04-02 NOTE — Assessment & Plan Note (Signed)
Unclear etiology for patient's recurrent syncopal episodes.  Orthostatic vital signs today within normal limits and she does not appear to be dehydrated on exam.  Given that symptoms have been persistent, discussed returning to the emergency department for possible admission as was directed when she was at the ED a week ago.  Patient declined.  Symptoms possibly consistent with seizure-like activity giving her motor symptoms just prior to losing consciousness.  Will place referral to neurology for further evaluation.  Discussed strict reasons to return to care and seek emergent care.

## 2018-04-02 NOTE — Patient Instructions (Signed)
It was very nice to see you today!  I will place a referral to the neurologist and neurosurgeon.  I will refill your Zanaflex and Mobic.  Please go to the emergency room if you have recurrent episodes or if your symptoms change in any way.  Take care, Dr Jerline Pain

## 2018-04-02 NOTE — Telephone Encounter (Signed)
See note  Copied from [place] [id number]. Topic: General - Other >> Apr 02, 2018  1:12 PM Carolyn Stare wrote:  Pt call to say she saw Dr Jerline Pain today and said he was going to call her in some depends and has has checked with the pharmacy and they don't have a RX for depends    [facility]

## 2018-04-02 NOTE — Telephone Encounter (Signed)
Please advise 

## 2018-04-02 NOTE — Progress Notes (Signed)
Subjective:  Laura [name] is a 48 y.o. female who presents today with a chief complaint of syncope follow up.   HPI:  Syncope, recent problem Patient was seen in the ED 8 days ago for recurrent syncopal episodes.  Patient reports that she had 18 episodes in which she felt dizzy, had some shakiness, and then lost consciousness.  In the emergency room she had work-up including EKG and blood work which was essentially normal.  She was thought to be dehydrated was given 2 L of normal saline.  She was then discharged home.  Over the last week, she has had recurrence of symptoms.  States that she will feel dizzy, feel like her hands are shaking, and then lose consciousness.  No recent medication changes.  Symptoms seem to occur randomly.  They are not positional.  She has had some worsening low back pain that is a chronic problem for her.  No recent medication changes.  No chest pain or shortness of breath.  No palpitations.  Chronic Back Pain Patient recently moved back to [place] from [place].  Reportedly had MRI done about 3 months ago in [place] which showed worsening degenerative changes in her lower back.  States that symptoms have significantly worsened.  She is currently on Mobic which helps some with the pain.  She also occasionally takes Flexeril which helps as well.  She would like to see neurosurgeon to discuss management options.  ROS: Per HPI  PMH: She reports that she has been smoking cigarettes. She has a 10.00 pack-year smoking history. She has never used smokeless tobacco. She reports previous alcohol use. She reports previous drug use. Frequency: 2.00 times per week.  Objective:  Physical Exam: BP 112/62 (BP Location: Left Arm, Patient Position: Sitting, Cuff Size: Normal)   Pulse 61   Temp 97.8 F (36.6 C) (Oral)   Ht 5\' 8"  (1.727 m)   Wt 169 lb 6.4 oz (76.8 kg)   SpO2 98%   BMI 25.76 kg/m   Wt Readings from Last 3 Encounters:  04/02/18 169 lb 6.4 oz  (76.8 kg)  03/25/18 160 lb (72.6 kg)  03/20/18 160 lb 6.4 oz (72.8 kg)  Gen: NAD, resting comfortably HEENT: Moist mucous membranes.  Extraocular movements intact. CV: RRR with no murmurs appreciated Pulm: NWOB, CTAB with no crackles, wheezes, or rhonchi GI: Normal bowel sounds present. Soft, Nontender, Nondistended. MSK: No edema, cyanosis, or clubbing noted Skin: Warm, dry Neuro: Cranial nerves II through XII intact.  Finger-nose-finger testing intact bilaterally.  Strength 5 out of 5 in upper and lower extremities.  Sensation light touch intact throughout. Psych: Normal affect and thought content  Assessment/Plan:  Seizure-like activity ([place]) Unclear etiology for patient's recurrent syncopal episodes.  Orthostatic vital signs today within normal limits and she does not appear to be dehydrated on exam.  Given that symptoms have been persistent, discussed returning to the emergency department for possible admission as was directed when she was at the ED a week ago.  Patient declined.  Symptoms possibly consistent with seizure-like activity giving her motor symptoms just prior to losing consciousness.  Will place referral to neurology for further evaluation.  Discussed strict reasons to return to care and seek emergent care.  Chronic pain syndrome secondary to DDD and lumbar spondylosis  Will obtain most recent imaging results from [place].  Place referral to neurosurgery to discuss management options.  Will refill Zanaflex and Mobic today.  Discussed reasons to return to care or seek emergent care.  Time Spent: I spent >25 minutes face-to-face with the patient, with more than half spent on coordinating care and counseling for management plan for her seizure-like activity/syncope and chronic low back pain.  Algis Greenhouse. Jerline Pain, MD 04/02/2018 12:20 PM

## 2018-04-03 ENCOUNTER — Encounter (HOSPITAL_COMMUNITY): Payer: Self-pay

## 2018-04-03 ENCOUNTER — Other Ambulatory Visit: Payer: Self-pay

## 2018-04-03 ENCOUNTER — Ambulatory Visit: Payer: Self-pay

## 2018-04-03 ENCOUNTER — Emergency Department (HOSPITAL_COMMUNITY)
Admission: EM | Admit: 2018-04-03 | Discharge: 2018-04-04 | Disposition: A | Discharge status: Home / Self Care | Payer: Medicare Other | Attending: Emergency Medicine | Admitting: Emergency Medicine

## 2018-04-03 DIAGNOSIS — M545 Low back pain, unspecified: Secondary | ICD-10-CM

## 2018-04-03 DIAGNOSIS — G8929 Other chronic pain: Secondary | ICD-10-CM | POA: Diagnosis not present

## 2018-04-03 DIAGNOSIS — Z79899 Other long term (current) drug therapy: Secondary | ICD-10-CM | POA: Diagnosis not present

## 2018-04-03 DIAGNOSIS — R55 Syncope and collapse: Secondary | ICD-10-CM | POA: Insufficient documentation

## 2018-04-03 DIAGNOSIS — F17210 Nicotine dependence, cigarettes, uncomplicated: Secondary | ICD-10-CM | POA: Insufficient documentation

## 2018-04-03 LAB — URINALYSIS, ROUTINE W REFLEX MICROSCOPIC
Bilirubin Urine: NEGATIVE
Glucose, UA: NEGATIVE mg/dL
Hgb urine dipstick: NEGATIVE
Ketones, ur: NEGATIVE mg/dL
Leukocytes, UA: NEGATIVE
Nitrite: NEGATIVE
Protein, ur: NEGATIVE mg/dL
SPECIFIC GRAVITY, URINE: 1.004 — AB (ref 1.005–1.030)
pH: 6 (ref 5.0–8.0)

## 2018-04-03 LAB — BASIC METABOLIC PANEL
Anion gap: 6 (ref 5–15)
BUN: 16 mg/dL (ref 6–20)
CHLORIDE: 104 mmol/L (ref 98–111)
CO2: 29 mmol/L (ref 22–32)
Calcium: 9.3 mg/dL (ref 8.9–10.3)
Creatinine, Ser: 0.67 mg/dL (ref 0.44–1.00)
GFR calc Af Amer: >60 mL/min (ref >60)
GFR calc non Af Amer: >60 mL/min (ref >60)
Glucose, Bld: 112 mg/dL — ABNORMAL HIGH (ref 70–99)
Potassium: 5 mmol/L (ref 3.5–5.1)
Sodium: 139 mmol/L (ref 135–145)

## 2018-04-03 LAB — CBC
HCT: 39 % (ref 36.0–46.0)
Hemoglobin: 12.4 g/dL (ref 12.0–15.0)
MCH: 31.2 pg (ref 26.0–34.0)
MCHC: 31.8 g/dL (ref 30.0–36.0)
MCV: 98 fL (ref 80.0–100.0)
Platelets: 277 10*3/uL (ref 150–400)
RBC: 3.98 MIL/uL (ref 3.87–5.11)
RDW: 12.9 % (ref 11.5–15.5)
WBC: 8.9 10*3/uL (ref 4.0–10.5)
nRBC: 0 % (ref 0.0–0.2)

## 2018-04-03 LAB — I-STAT BETA HCG BLOOD, ED (MC, WL, AP ONLY): I-stat hCG, quantitative: 6 m[IU]/mL — ABNORMAL HIGH (ref <5)

## 2018-04-03 MED ORDER — SODIUM CHLORIDE 0.9% FLUSH: 3.0000 mL | Freq: Once | INTRAVENOUS | Status: DC

## 2018-04-03 MED ORDER — LORAZEPAM 1 MG PO TABS
1.0000 mg | ORAL_TABLET | Freq: Once | ORAL | Status: AC
  Administered 2018-04-04: 1 mg via ORAL
  Filled 2018-04-03: qty 1

## 2018-04-03 NOTE — Telephone Encounter (Signed)
Faxed to patient's pharmacy.  

## 2018-04-03 NOTE — Telephone Encounter (Signed)
Pt. Reports she feels worse than yesterday. Legs are trembling, can't get to the bathroom in time to void. "If I try to walk more than 20 feet, I have to stop." States she feels "like I need to go to the hospital. My neurology isn't until March and I can't wait until then." Pt. Will have a friend take her to the ED for evaluation.  Reason for Disposition . Patient sounds very sick or weak to the triager  Answer Assessment - Initial Assessment Questions 1. DESCRIPTION: "Describe how you are feeling."     Very weak 2. SEVERITY: "How bad is it?"  "Can you stand and walk?"   - MILD - Feels weak or tired, but does not interfere with work, school or normal activities   - [place] to stand and walk; weakness interferes with work, school, or normal activities   - SEVERE - Unable to stand or walk     Moderate 3. ONSET:  "When did the weakness begin?"     Started 1-2 weeks ago 4. CAUSE: "What do you think is causing the weakness?"     Unsure 5. MEDICINES: "Have you recently started a new medicine or had a change in the amount of a medicine?"     No 6. OTHER SYMPTOMS: "Do you have any other symptoms?" (e.g., chest pain, fever, cough, SOB, vomiting, diarrhea, bleeding, other areas of pain)     No 7. PREGNANCY: "Is there any chance you are pregnant?" "When was your last menstrual period?"     No  Protocols used: WEAKNESS (GENERALIZED) AND FATIGUE-A-AH

## 2018-04-03 NOTE — Telephone Encounter (Signed)
Noted  

## 2018-04-03 NOTE — Telephone Encounter (Signed)
Order for incontinence supplies printed and signed.  Algis Greenhouse. Jerline Pain, MD 04/03/2018 8:01 AM

## 2018-04-03 NOTE — ED Notes (Signed)
Patient states that she can no longer hold her urine and has episodes of incontinence when she has her pain in her lower back/ weakness episodes.

## 2018-04-03 NOTE — Telephone Encounter (Signed)
See note

## 2018-04-03 NOTE — ED Provider Notes (Signed)
[facility] DEPT Provider Note   CSN: [id number] Arrival date & time: 04/03/18  3875     History   Chief Complaint Chief Complaint  Patient presents with  . Loss of Consciousness    HPI Noemy Hallmon is a 48 y.o. female.  Patient with history of IV drug addiction, hepatitis C, chronic back pain who presents the ED after syncopal event.  Patient continues to have syncopal events over the last several weeks.  Had work-up in the emergency department several weeks ago.  Was seen by her primary care doctor yesterday who was concerned for possible seizure-like activity and has arranged for neurology follow-up.  Patient states that events have occurred when she is walking, she will feel sudden back pain, weakness in her legs, warm and flushed feeling and then passed out on the floor.  She states that she will have some arm shaking during the event as well.  Does not appear to have any post event confusion.  No chest pain, no shortness of breath.  Has an MRI of her low back ordered outpatient.  She states that she has increasing urinary incontinence, weakness and numbness in her legs.  Denies any saddle anesthesia, loss of rectal tone.  The history is provided by the patient.  Loss of Consciousness  Episode history:  Multiple Most recent episode:  Today Timing:  Intermittent Progression:  Waxing and waning Chronicity:  Recurrent Witnessed: no   Relieved by:  Bed rest Worsened by:  Nothing Associated symptoms: anxiety, nausea, seizures (possible shaking during event) and weakness   Associated symptoms: no chest pain, no dizziness, no fever, no focal weakness, no headaches, no palpitations, no shortness of breath, no visual change and no vomiting     Past Medical History:  Diagnosis Date  . Anxiety   . Depression   . ETOH abuse   . Hepatitis C   . Sebaceous cyst 03/20/2018  . Substance abuse [facility])     Patient Active Problem List   Diagnosis Date  Noted  . Seizure-like activity ([place]) 04/02/2018  . Anxiety 03/20/2018  . Decreased hearing of right ear 03/20/2018  . Cigarette smoker 05/28/2017  . IV Drug addiction ([place]) 05/27/2017  . Uterine leiomyoma 04/18/2017  . Chronic hepatitis C ([place]) 03/29/2017  . 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 Insomnia 03/29/2017  . PTSD (post-traumatic stress disorder) 08/12/2016  . History of suicide attempt   . Alcohol use disorder, severe, dependence ([place]) 07/23/2016  . Prolonged Q-T interval on ECG 07/23/2016  . 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 BACK SURGERY    . KNEE SURGERY       OB History   No obstetric history on file.      Home Medications    Prior to Admission medications   Medication Sig Start Date End Date Taking? Authorizing Provider  Buprenorphine HCl (SUBUTEX SL) Place 12 mg under the tongue daily.   Yes [provider]  cloNIDine (CATAPRES) 0.1 MG tablet Take 1 tablet (0.1 mg total) by mouth daily. Patient taking differently: Take 0.2 mg by mouth 3 (three) times daily.  03/20/18  Yes Vivi Barrack, MD  escitalopram (LEXAPRO) 20 MG tablet Take 20 mg by mouth daily.  03/27/18  Yes [provider]  meloxicam (MOBIC) 15 MG tablet Take 1 tablet (15 mg total) by mouth daily. 04/02/18  Yes Vivi Barrack, MD  QUEtiapine (SEROQUEL) 400 MG tablet Take 1 tablet (400 mg total) by mouth at bedtime. Patient taking differently: Take 200 mg by mouth at bedtime.  03/20/18  Yes Vivi Barrack, MD  traZODone (DESYREL) 100 MG tablet Take 1 tablet (100 mg total) by mouth at bedtime as needed for sleep. For sleep 03/20/18  Yes Vivi Barrack, MD  tiZANidine (ZANAFLEX) 4 MG tablet Take 1 tablet (4 mg total) by mouth every 6 (six) hours as needed for muscle spasms. 04/02/18   Vivi Barrack, MD    Family History Family History    Family history unknown: Yes    Social History Social History   Tobacco Use  . Smoking status: Current Every Day Smoker    Packs/day: 0.50    Years: 20.00    Pack years: 10.00    Types: Cigarettes  . Smokeless tobacco: Never Used  Substance Use Topics  . Alcohol use: Not Currently    Comment: occassionally  . Drug use: Not Currently    Frequency: 2.0 times per week     Allergies   Patient has no known allergies.   Review of Systems Review of Systems  Constitutional: Negative for chills and fever.  HENT: Negative for ear pain and sore throat.   Eyes: Negative for pain and visual disturbance.  Respiratory: Negative for cough and shortness of breath.   Cardiovascular: Positive for syncope. Negative for chest pain and palpitations.  Gastrointestinal: Positive for nausea. Negative for abdominal pain and vomiting.  Genitourinary: Positive for difficulty urinating and frequency. Negative for decreased urine volume, dysuria, hematuria, menstrual problem, pelvic pain, urgency, vaginal bleeding, vaginal discharge and vaginal pain.  Musculoskeletal: Negative for arthralgias and back pain.  Skin: Negative for color change and rash.  Neurological: Positive for seizures (possible shaking during event), weakness, light-headedness and numbness. Negative for dizziness, tremors, focal weakness, syncope, facial asymmetry and headaches.  All other systems reviewed and are negative.    Physical Exam Updated Vital Signs  ED Triage Vitals  Enc Vitals Group     BP 04/03/18 1857 114/71     Pulse Rate 04/03/18 1857 68     Resp 04/03/18 1857 16     Temp 04/03/18 1857 97.6 F (36.4 C)     Temp Source 04/03/18 1857 Oral     SpO2 04/03/18 1857 100 %     Weight --      Height --      Head Circumference --      Peak Flow --      Pain Score 04/03/18 1859 10     Pain Loc --      Pain Edu? --      Excl. in [place]? --     Physical Exam Vitals signs and nursing note reviewed.   Constitutional:      General: She is not in acute distress.    Appearance: She is well-developed.  HENT:     Head: Normocephalic and atraumatic.     Right Ear: Tympanic membrane normal.     Left Ear: Tympanic membrane normal.     Nose: Nose normal.     Mouth/Throat:     Mouth: Mucous membranes are moist.  Eyes:     Extraocular Movements: Extraocular movements intact.     Conjunctiva/sclera: Conjunctivae normal.     Pupils: Pupils are equal, round, and reactive to light.  Neck:     Musculoskeletal: Normal range of motion and neck supple.  Cardiovascular:     Rate and Rhythm: Normal rate and regular rhythm.     Pulses: Normal pulses.     Heart sounds: Normal heart sounds. No murmur.  Pulmonary:     Effort: Pulmonary effort is normal. No respiratory distress.     Breath sounds: Normal breath sounds.  Abdominal:     General: There is no distension.     Palpations: Abdomen is soft.     Tenderness: There is no abdominal tenderness.  Musculoskeletal: Normal range of motion.        General: No tenderness.     Right lower leg: No edema.     Left lower leg: No edema.     Comments: TTP to midline lumbar spine  Skin:    General: Skin is warm and dry.     Capillary Refill: Capillary refill takes less than 2 seconds.  Neurological:     Mental Status: She is alert and oriented to person, place, and time.     Cranial Nerves: No cranial nerve deficit.     Sensory: No sensory deficit.     Coordination: Coordination normal.     Comments: 5+ out of 5 upper extremity strength bilaterally, 4+ out of 5 strength in lower extremity bilaterally although appears to be possibly effort dependent, no obvious sensation changes throughout  Psychiatric:        Mood and Affect: Mood normal.     Comments: anxious      ED Treatments / Results  Labs (all labs ordered are listed, but only abnormal results are displayed) Labs Reviewed  BASIC METABOLIC PANEL - Abnormal; Notable for the following  components:      Result Value   Glucose, Bld 112 (*)    All other components within normal limits  URINALYSIS, ROUTINE W REFLEX MICROSCOPIC - Abnormal; Notable for the following components:   Specific Gravity, Urine 1.004 (*)    All other components within normal limits  I-STAT BETA HCG BLOOD, ED ([facility], [facility], [facility] ONLY) - Abnormal; Notable for the following components:   I-stat hCG, quantitative 6.0 (*)    All other components within normal limits  CBC  CBG MONITORING, ED    EKG EKG Interpretation  Date/Time:  Thursday April 03 2018 19:09:57 EST Ventricular Rate:  63 PR Interval:    QRS Duration: 85 QT Interval:  431 QTC Calculation: 442 R Axis:   76 Text Interpretation:  Sinus rhythm ST elev, probable normal early repol pattern Confirmed by Lennice Sites [phone]) on 04/03/2018 9:36:35 PM Also confirmed by Lennice Sites [phone])  on 04/03/2018 10:11:28 PM   Radiology No results found.  Procedures Procedures (including critical care time)  Medications Ordered in ED Medications  sodium chloride flush (NS) 0.9 % injection 3 mL (has no administration in time range)  LORazepam (ATIVAN) tablet 1 mg (has no administration in time range)     Initial Impression / Assessment and Plan / ED Course  I have reviewed the triage vital signs and the nursing notes.  Pertinent labs & imaging results that were available during my care of the patient were reviewed by me and considered in my medical decision making (see chart for details).     Paxtyn Wisdom is a 48 year old female with history of depression, PTSD, polysubstance abuse, hepatitis C, chronic low back pain who presents to the ED following syncopal episodes.  Patient has had several weeks of these episodes.  Saw her primary care doctor yesterday who ordered outpatient MRI of  her lumbar spine as she has had increasing low back pain, weakness in her legs, urinary incontinence. Has not been performed yet. Patient also has outpatient  neurology appointment set up by her primary care doctor as concern for possible seizure-like activity.  Had an extensive work-up in the emergency department several weeks ago that was overall unremarkable.  Possible dehydration.  Patient EKG that showed sinus rhythm then and continues to have normal sinus rhythm on EKG here upon arrival.  Patient states that episodes are always slightly different.  However they appear to occur more frequently when she has worsening back pain with ambulation.  It appears that her events today occurred while she was walking and she had severe low back pain and had weakness in her legs and felt nauseous and sick and passed out.  She states that she thought she had some shaking in her hands.  No obvious postictal state. No tongue biting.  Patient denies any IV drug use, alcohol use.  She denies any saddle anesthesia but does state she feels like she is been incontinent of urine.  Has not had any loss of bowel or abnormal rectal tone.  Has some weakness in her legs on exam but overall effort dependent.  Patient is extremely anxious upon my examination as well.  EKG shows no arrhythmia.  Have low suspicion for cardiac process.  Urinalysis negative for infection.  Pregnancy test negative.  No significant electrolyte abnormality, kidney injury.  No significant anemia, leukocytosis.  Patient has midline spinal pain on exam, possible LE weakness.    Has some history and physical findings suggestive of possible spinal process in the lumbar spine.  Therefore will get MRI of the lumbar spine to rule out cauda equina or fracture.  Overall low concern for cardiac arrhythmia, seizures.  If MRI is unremarkable believe patient is safe for discharge to home with continued outpatient neurology work-up.  Patient is already not driving a vehicle.  She can follow-up with her primary care doctor to discuss wearing a cardiac monitor as well, but no cardiac events since being here.  Suspect that there is  an anxiety/pain component to these episodes as well.  Patient was transferred over to [facility] for MRI of her lumbar spine and for further monitoring.  Please see ED provider's note for further results, evaluation, disposition of the patient. Discussed with [facility] Venora Maples at [facility].  This chart was dictated using voice recognition software.  Despite best efforts to proofread,  errors can occur which can change the documentation meaning.   Final Clinical Impressions(s) / ED Diagnoses   Final diagnoses:  Syncope, unspecified syncope type  Lumbar pain    ED Discharge Orders    None       Lennice Sites, DO 04/04/18 0015

## 2018-04-03 NOTE — ED Notes (Signed)
Patient CBG 125. Patient reminded not to drink her soda or and PO fluids.

## 2018-04-03 NOTE — ED Triage Notes (Signed)
Pt reports 2 episodes of LOC today. She states that she was seen last week for same. She states that she got some IV fluids and felt better initially. She reports that 4 days ago, she started losing bladder control. She states that she went to her PCP and was told to follow up with neurology or go to the ED. She also endorses weakness and shaking. She is ambulatory. She has a hx of chronic lower back pain and bilateral leg pain. She states that her chronic pain is worse than usual. A&Ox4.

## 2018-04-03 NOTE — Addendum Note (Signed)
Addended by: Vivi Barrack on: 04/03/2018 08:01 AM   Modules accepted: Orders

## 2018-04-04 ENCOUNTER — Ambulatory Visit: Payer: Medicare Other | Admitting: Obstetrics and Gynecology

## 2018-04-04 ENCOUNTER — Encounter: Payer: Self-pay | Admitting: Obstetrics and Gynecology

## 2018-04-04 ENCOUNTER — Telehealth: Payer: Self-pay | Admitting: Obstetrics and Gynecology

## 2018-04-04 ENCOUNTER — Emergency Department (HOSPITAL_COMMUNITY): Payer: Medicare Other

## 2018-04-04 DIAGNOSIS — Z79899 Other long term (current) drug therapy: Secondary | ICD-10-CM | POA: Diagnosis not present

## 2018-04-04 DIAGNOSIS — F17210 Nicotine dependence, cigarettes, uncomplicated: Secondary | ICD-10-CM | POA: Diagnosis not present

## 2018-04-04 DIAGNOSIS — M545 Low back pain: Secondary | ICD-10-CM | POA: Diagnosis not present

## 2018-04-04 DIAGNOSIS — G8929 Other chronic pain: Secondary | ICD-10-CM | POA: Diagnosis not present

## 2018-04-04 DIAGNOSIS — R55 Syncope and collapse: Secondary | ICD-10-CM | POA: Diagnosis not present

## 2018-04-04 LAB — CBG MONITORING, ED: Glucose-Capillary: 125 mg/dL — ABNORMAL HIGH (ref 70–99)

## 2018-04-04 NOTE — ED Provider Notes (Signed)
Patient to [facility] ED by transfer from [facility] for MRI lumbar spine to r/o cauda equina, fracture in evaluation of back pain, urine incontinence, bilateral LE weakness that has been progressive, becoming severe yesterday. She was able to ambulate to the ED.  Plan: if Lumbar MRI does not show acute neurosurgical condition, the patient is felt appropriate for discharge home to pursue the referral to neurosurgery made by her PCP.   The patient's MR has no cord compromise. 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 The patient does not describe right sided radicular pain. She reports have multiple surgeries on her back in the past. It is felt appropriate and standard of care to keep the appointment made with neurosurgery in the outpatient setting.   The results and plan were conveyed to the patient. She is directed to her PCP for outpatient pain control.    Charlann Lange, PA-C 04/04/18 0549    Merrily Pew, MD 04/04/18 [phone]

## 2018-04-04 NOTE — ED Notes (Signed)
Report given to Agricultural consultant at [facility].

## 2018-04-04 NOTE — Telephone Encounter (Signed)
Called and left a message for patient to call back to schedule a new patient doctor referral appointment with our office to see Dr. Quincy Simmonds for: Uterine leiomyoma. It looks like the patient had another appointment at the ED at the same time.

## 2018-04-04 NOTE — Discharge Instructions (Addendum)
Keep the scheduled appointment with neurosurgery as referred by your doctor. Call you primary care provider to assist with pain management until that outpatient appointment.

## 2018-04-04 NOTE — ED Notes (Signed)
[facility] called for transport, report given to [facility].

## 2018-04-07 NOTE — Telephone Encounter (Signed)
Called and left a message for patient to call back to schedule a new patient doctor referral appointment with our office to see Dr. Quincy Simmonds for: Uterine leiomyoma.

## 2018-04-07 NOTE — Telephone Encounter (Signed)
Referral routed back to referring office. ?

## 2018-04-08 DIAGNOSIS — M5441 Lumbago with sciatica, right side: Secondary | ICD-10-CM | POA: Diagnosis not present

## 2018-04-08 DIAGNOSIS — Z6826 Body mass index (BMI) 26.0-26.9, adult: Secondary | ICD-10-CM | POA: Diagnosis not present

## 2018-04-10 ENCOUNTER — Other Ambulatory Visit: Payer: Self-pay

## 2018-04-10 ENCOUNTER — Telehealth: Payer: Self-pay | Admitting: Family Medicine

## 2018-04-10 DIAGNOSIS — R32 Unspecified urinary incontinence: Secondary | ICD-10-CM

## 2018-04-10 DIAGNOSIS — M48062 Spinal stenosis, lumbar region with neurogenic claudication: Secondary | ICD-10-CM | POA: Diagnosis not present

## 2018-04-10 DIAGNOSIS — M5136 Other intervertebral disc degeneration, lumbar region: Secondary | ICD-10-CM | POA: Diagnosis not present

## 2018-04-10 NOTE — Telephone Encounter (Signed)
See note  Copied from [place] [phone]. Topic: Referral - Request for Referral >> Apr 10, 2018 11:07 AM Sheran Luz wrote: Has patient seen PCP for this complaint? Yes Referral for which specialty: Urology  Preferred provider/office: No preference other than located in [place] Reason for referral: Patient would not disclose- Just states that Dr. Jerline Pain knows and that she can be contacted with further questions by his assistant.

## 2018-04-10 NOTE — Telephone Encounter (Signed)
Referral placed.

## 2018-04-10 NOTE — Telephone Encounter (Signed)
Please advise 

## 2018-04-10 NOTE — Telephone Encounter (Signed)
[place] with me. Please place referral for urinary incontinence.  Algis Greenhouse. Jerline Pain, MD 04/10/2018 2:06 PM

## 2018-04-11 ENCOUNTER — Ambulatory Visit: Payer: Medicare Other | Admitting: Obstetrics & Gynecology

## 2018-04-15 DIAGNOSIS — M5136 Other intervertebral disc degeneration, lumbar region: Secondary | ICD-10-CM | POA: Diagnosis not present

## 2018-04-17 ENCOUNTER — Emergency Department (HOSPITAL_COMMUNITY): Payer: Medicare Other

## 2018-04-17 ENCOUNTER — Inpatient Hospital Stay (HOSPITAL_COMMUNITY)
Admission: EM | Admit: 2018-04-17 | Discharge: 2018-04-21 | DRG: 871 | Disposition: A | Discharge status: Home / Self Care | Payer: Medicare Other | Attending: Family Medicine | Admitting: Family Medicine

## 2018-04-17 ENCOUNTER — Ambulatory Visit: Payer: Self-pay

## 2018-04-17 ENCOUNTER — Ambulatory Visit (HOSPITAL_COMMUNITY): Payer: Medicare Other | Admitting: Psychiatry

## 2018-04-17 ENCOUNTER — Encounter (HOSPITAL_COMMUNITY): Payer: Self-pay

## 2018-04-17 ENCOUNTER — Other Ambulatory Visit: Payer: Self-pay

## 2018-04-17 DIAGNOSIS — I361 Nonrheumatic tricuspid (valve) insufficiency: Secondary | ICD-10-CM | POA: Diagnosis not present

## 2018-04-17 DIAGNOSIS — E872 Acidosis, unspecified: Secondary | ICD-10-CM

## 2018-04-17 DIAGNOSIS — R0602 Shortness of breath: Secondary | ICD-10-CM | POA: Diagnosis not present

## 2018-04-17 DIAGNOSIS — R569 Unspecified convulsions: Secondary | ICD-10-CM

## 2018-04-17 DIAGNOSIS — F17210 Nicotine dependence, cigarettes, uncomplicated: Secondary | ICD-10-CM | POA: Diagnosis present

## 2018-04-17 DIAGNOSIS — R6520 Severe sepsis without septic shock: Secondary | ICD-10-CM | POA: Diagnosis not present

## 2018-04-17 DIAGNOSIS — Z791 Long term (current) use of non-steroidal anti-inflammatories (NSAID): Secondary | ICD-10-CM

## 2018-04-17 DIAGNOSIS — N170 Acute kidney failure with tubular necrosis: Secondary | ICD-10-CM | POA: Diagnosis not present

## 2018-04-17 DIAGNOSIS — I071 Rheumatic tricuspid insufficiency: Secondary | ICD-10-CM | POA: Diagnosis present

## 2018-04-17 DIAGNOSIS — A419 Sepsis, unspecified organism: Secondary | ICD-10-CM

## 2018-04-17 DIAGNOSIS — B182 Chronic viral hepatitis C: Secondary | ICD-10-CM | POA: Diagnosis present

## 2018-04-17 DIAGNOSIS — R918 Other nonspecific abnormal finding of lung field: Secondary | ICD-10-CM | POA: Diagnosis not present

## 2018-04-17 DIAGNOSIS — M48061 Spinal stenosis, lumbar region without neurogenic claudication: Secondary | ICD-10-CM | POA: Diagnosis present

## 2018-04-17 DIAGNOSIS — R159 Full incontinence of feces: Secondary | ICD-10-CM

## 2018-04-17 DIAGNOSIS — N179 Acute kidney failure, unspecified: Secondary | ICD-10-CM | POA: Diagnosis present

## 2018-04-17 DIAGNOSIS — J9601 Acute respiratory failure with hypoxia: Secondary | ICD-10-CM

## 2018-04-17 DIAGNOSIS — F1120 Opioid dependence, uncomplicated: Secondary | ICD-10-CM

## 2018-04-17 DIAGNOSIS — F329 Major depressive disorder, single episode, unspecified: Secondary | ICD-10-CM

## 2018-04-17 DIAGNOSIS — E876 Hypokalemia: Secondary | ICD-10-CM | POA: Diagnosis not present

## 2018-04-17 DIAGNOSIS — I10 Essential (primary) hypertension: Secondary | ICD-10-CM | POA: Diagnosis present

## 2018-04-17 DIAGNOSIS — R55 Syncope and collapse: Secondary | ICD-10-CM | POA: Diagnosis not present

## 2018-04-17 DIAGNOSIS — R7881 Bacteremia: Secondary | ICD-10-CM | POA: Diagnosis present

## 2018-04-17 LAB — CBC WITH DIFFERENTIAL/PLATELET
ABS IMMATURE GRANULOCYTES: 0.1 10*3/uL — AB (ref 0.00–0.07)
Basophils Absolute: 0.1 10*3/uL (ref 0.0–0.1)
Basophils Relative: 0 %
Eosinophils Absolute: 0.2 10*3/uL (ref 0.0–0.5)
Eosinophils Relative: 1 %
HCT: 37.7 % (ref 36.0–46.0)
Hemoglobin: 11.9 g/dL — ABNORMAL LOW (ref 12.0–15.0)
Immature Granulocytes: 1 %
Lymphocytes Relative: 18 %
Lymphs Abs: 3.3 10*3/uL (ref 0.7–4.0)
MCH: 31.1 pg (ref 26.0–34.0)
MCHC: 31.6 g/dL (ref 30.0–36.0)
MCV: 98.4 fL (ref 80.0–100.0)
Monocytes Absolute: 0.6 10*3/uL (ref 0.1–1.0)
Monocytes Relative: 3 %
Neutro Abs: 14 10*3/uL — ABNORMAL HIGH (ref 1.7–7.7)
Neutrophils Relative %: 77 %
Platelets: 280 10*3/uL (ref 150–400)
RBC: 3.83 MIL/uL — ABNORMAL LOW (ref 3.87–5.11)
RDW: 13 % (ref 11.5–15.5)
WBC: 18.2 10*3/uL — ABNORMAL HIGH (ref 4.0–10.5)
nRBC: 0 % (ref 0.0–0.2)

## 2018-04-17 LAB — RESPIRATORY PANEL BY PCR
Adenovirus: NOT DETECTED
Bordetella pertussis: NOT DETECTED
Chlamydophila pneumoniae: NOT DETECTED
Coronavirus 229E: NOT DETECTED
Coronavirus HKU1: NOT DETECTED
Coronavirus NL63: NOT DETECTED
Coronavirus OC43: NOT DETECTED
INFLUENZA B-RVPPCR: NOT DETECTED
Influenza A: NOT DETECTED
MYCOPLASMA PNEUMONIAE-RVPPCR: NOT DETECTED
Metapneumovirus: NOT DETECTED
Parainfluenza Virus 1: NOT DETECTED
Parainfluenza Virus 2: NOT DETECTED
Parainfluenza Virus 3: NOT DETECTED
Parainfluenza Virus 4: NOT DETECTED
Respiratory Syncytial Virus: NOT DETECTED
Rhinovirus / Enterovirus: NOT DETECTED

## 2018-04-17 LAB — COMPREHENSIVE METABOLIC PANEL
ALT: 34 U/L (ref 0–44)
ANION GAP: 9 (ref 5–15)
AST: 42 U/L — ABNORMAL HIGH (ref 15–41)
Albumin: 3.8 g/dL (ref 3.5–5.0)
Alkaline Phosphatase: 90 U/L (ref 38–126)
BUN: 23 mg/dL — ABNORMAL HIGH (ref 6–20)
CO2: 23 mmol/L (ref 22–32)
Calcium: 8.8 mg/dL — ABNORMAL LOW (ref 8.9–10.3)
Chloride: 101 mmol/L (ref 98–111)
Creatinine, Ser: 1.01 mg/dL — ABNORMAL HIGH (ref 0.44–1.00)
GFR calc Af Amer: >60 mL/min (ref >60)
GFR calc non Af Amer: >60 mL/min (ref >60)
GLUCOSE: 169 mg/dL — AB (ref 70–99)
Potassium: 4 mmol/L (ref 3.5–5.1)
Sodium: 133 mmol/L — ABNORMAL LOW (ref 135–145)
Total Bilirubin: 1 mg/dL (ref 0.3–1.2)
Total Protein: 7.1 g/dL (ref 6.5–8.1)

## 2018-04-17 LAB — CBC
HCT: 37.8 % (ref 36.0–46.0)
Hemoglobin: 12 g/dL (ref 12.0–15.0)
MCH: 31.7 pg (ref 26.0–34.0)
MCHC: 31.7 g/dL (ref 30.0–36.0)
MCV: 100 fL (ref 80.0–100.0)
Platelets: 232 10*3/uL (ref 150–400)
RBC: 3.78 MIL/uL — ABNORMAL LOW (ref 3.87–5.11)
RDW: 13 % (ref 11.5–15.5)
WBC: 16.5 10*3/uL — ABNORMAL HIGH (ref 4.0–10.5)
nRBC: 0 % (ref 0.0–0.2)

## 2018-04-17 LAB — LACTIC ACID, PLASMA
Lactic Acid, Venous: 2.4 mmol/L (ref 0.5–1.9)
Lactic Acid, Venous: 1.8 mmol/L (ref 0.5–1.9)
Lactic Acid, Venous: 1 mmol/L (ref 0.5–1.9)

## 2018-04-17 LAB — RAPID URINE DRUG SCREEN, HOSP PERFORMED
Amphetamines: NOT DETECTED
Barbiturates: NOT DETECTED
Benzodiazepines: NOT DETECTED
Cocaine: NOT DETECTED
Opiates: POSITIVE — AB
Tetrahydrocannabinol: NOT DETECTED

## 2018-04-17 LAB — URINALYSIS, ROUTINE W REFLEX MICROSCOPIC
Bilirubin Urine: NEGATIVE
Glucose, UA: NEGATIVE mg/dL
Hgb urine dipstick: NEGATIVE
Ketones, ur: NEGATIVE mg/dL
LEUKOCYTES UA: NEGATIVE
Nitrite: NEGATIVE
Protein, ur: NEGATIVE mg/dL
Specific Gravity, Urine: 1.005 (ref 1.005–1.030)
pH: 6 (ref 5.0–8.0)

## 2018-04-17 LAB — INFLUENZA PANEL BY PCR (TYPE A & B)
Influenza A By PCR: NEGATIVE
Influenza B By PCR: NEGATIVE

## 2018-04-17 LAB — C-REACTIVE PROTEIN: CRP: 8.5 mg/dL — ABNORMAL HIGH (ref <1.0)

## 2018-04-17 LAB — BRAIN NATRIURETIC PEPTIDE: B Natriuretic Peptide: 62.9 pg/mL (ref 0.0–100.0)

## 2018-04-17 LAB — CREATININE, SERUM
Creatinine, Ser: 0.65 mg/dL (ref 0.44–1.00)
GFR calc Af Amer: >60 mL/min (ref >60)
GFR calc non Af Amer: >60 mL/min (ref >60)

## 2018-04-17 LAB — CBG MONITORING, ED: Glucose-Capillary: 184 mg/dL — ABNORMAL HIGH (ref 70–99)

## 2018-04-17 LAB — TROPONIN I: Troponin I: <0.03 ng/mL (ref <0.03)

## 2018-04-17 LAB — SEDIMENTATION RATE: Sed Rate: 17 mm/hr (ref 0–22)

## 2018-04-17 MED ORDER — SODIUM CHLORIDE 0.9 % IV BOLUS
1000.0000 mL | Freq: Once | INTRAVENOUS | Status: AC
  Administered 2018-04-17: 1000 mL via INTRAVENOUS

## 2018-04-17 MED ORDER — SODIUM CHLORIDE 0.9 % IV BOLUS (SEPSIS)
1000.0000 mL | Freq: Once | INTRAVENOUS | Status: AC
  Administered 2018-04-18: 1000 mL via INTRAVENOUS

## 2018-04-17 MED ORDER — METRONIDAZOLE IN NACL 5-0.79 MG/ML-% IV SOLN
500.0000 mg | Freq: Three times a day (TID) | INTRAVENOUS | Status: DC
  Administered 2018-04-18 – 2018-04-20 (×8): 500 mg via INTRAVENOUS
  Filled 2018-04-17 (×7): qty 100

## 2018-04-17 MED ORDER — IOPAMIDOL (ISOVUE-370) INJECTION 76%
100.0000 mL | Freq: Once | INTRAVENOUS | Status: AC | PRN
  Administered 2018-04-17: 100 mL via INTRAVENOUS

## 2018-04-17 MED ORDER — SODIUM CHLORIDE (PF) 0.9 % IJ SOLN
INTRAMUSCULAR | Status: AC
  Filled 2018-04-17: qty 50

## 2018-04-17 MED ORDER — SODIUM CHLORIDE 0.9 % IV SOLN: 2.0000 g | INTRAVENOUS | Status: DC

## 2018-04-17 MED ORDER — HYDROCORTISONE NA SUCCINATE PF 100 MG IJ SOLR
50.0000 mg | Freq: Four times a day (QID) | INTRAMUSCULAR | Status: DC
  Administered 2018-04-18 – 2018-04-19 (×2): 50 mg via INTRAVENOUS
  Filled 2018-04-17 (×2): qty 2

## 2018-04-17 MED ORDER — SODIUM CHLORIDE 0.9 % IV SOLN
2.0000 g | Freq: Once | INTRAVENOUS | Status: AC
  Administered 2018-04-17: 2 g via INTRAVENOUS
  Filled 2018-04-17: qty 2

## 2018-04-17 MED ORDER — SODIUM CHLORIDE 0.9 % IV BOLUS
500.0000 mL | Freq: Once | INTRAVENOUS | Status: AC
  Administered 2018-04-17: 500 mL via INTRAVENOUS

## 2018-04-17 MED ORDER — SODIUM CHLORIDE 0.9 % IV SOLN
INTRAVENOUS | Status: DC
  Administered 2018-04-18 – 2018-04-19 (×4): via INTRAVENOUS

## 2018-04-17 MED ORDER — MELOXICAM 15 MG PO TABS
15.0000 mg | ORAL_TABLET | Freq: Every day | ORAL | Status: DC
  Administered 2018-04-18 – 2018-04-20 (×3): 15 mg via ORAL
  Filled 2018-04-17 (×4): qty 1

## 2018-04-17 MED ORDER — VANCOMYCIN HCL 10 G IV SOLR
1500.0000 mg | Freq: Once | INTRAVENOUS | Status: AC
  Administered 2018-04-17: 1500 mg via INTRAVENOUS
  Filled 2018-04-17: qty 1500

## 2018-04-17 MED ORDER — VANCOMYCIN HCL IN DEXTROSE 1-5 GM/200ML-% IV SOLN: 1000.0000 mg | Freq: Once | INTRAVENOUS | Status: DC

## 2018-04-17 MED ORDER — VANCOMYCIN HCL 10 G IV SOLR: 1500.0000 mg | INTRAVENOUS | Status: DC

## 2018-04-17 MED ORDER — SORBITOL 70 % SOLN: 30.0000 mL | Freq: Every day | Status: DC | PRN

## 2018-04-17 MED ORDER — TRAZODONE HCL 100 MG PO TABS
100.0000 mg | ORAL_TABLET | Freq: Every day | ORAL | Status: DC
  Administered 2018-04-17 – 2018-04-20 (×4): 100 mg via ORAL
  Filled 2018-04-17 (×4): qty 1

## 2018-04-17 MED ORDER — QUETIAPINE FUMARATE 200 MG PO TABS
200.0000 mg | ORAL_TABLET | Freq: Every day | ORAL | Status: DC
  Administered 2018-04-17 – 2018-04-20 (×4): 200 mg via ORAL
  Filled 2018-04-17 (×4): qty 1

## 2018-04-17 MED ORDER — NALOXONE HCL 0.4 MG/ML IJ SOLN: 0.2000 mg | Freq: Once | INTRAMUSCULAR | Status: DC

## 2018-04-17 MED ORDER — OSELTAMIVIR PHOSPHATE 75 MG PO CAPS
75.0000 mg | ORAL_CAPSULE | Freq: Two times a day (BID) | ORAL | Status: DC
  Administered 2018-04-17 – 2018-04-18 (×2): 75 mg via ORAL
  Filled 2018-04-17 (×2): qty 1

## 2018-04-17 MED ORDER — SODIUM CHLORIDE 0.9 % IV SOLN
2.0000 g | Freq: Three times a day (TID) | INTRAVENOUS | Status: DC
  Filled 2018-04-17: qty 2

## 2018-04-17 MED ORDER — IOPAMIDOL (ISOVUE-370) INJECTION 76%
INTRAVENOUS | Status: AC
  Filled 2018-04-17: qty 100

## 2018-04-17 MED ORDER — KETOROLAC TROMETHAMINE 15 MG/ML IJ SOLN
15.0000 mg | Freq: Four times a day (QID) | INTRAMUSCULAR | Status: DC | PRN
  Administered 2018-04-20: 15 mg via INTRAVENOUS
  Filled 2018-04-17 (×2): qty 1

## 2018-04-17 MED ORDER — NALOXONE HCL 0.4 MG/ML IJ SOLN
0.4000 mg | Freq: Once | INTRAMUSCULAR | Status: AC
  Administered 2018-04-17: 0.4 mg via INTRAVENOUS
  Filled 2018-04-17: qty 1

## 2018-04-17 MED ORDER — TIZANIDINE HCL 4 MG PO TABS
4.0000 mg | ORAL_TABLET | Freq: Four times a day (QID) | ORAL | Status: DC | PRN
  Administered 2018-04-20: 4 mg via ORAL
  Filled 2018-04-17: qty 1

## 2018-04-17 MED ORDER — SODIUM CHLORIDE 0.9 % IV BOLUS (SEPSIS)
250.0000 mL | Freq: Once | INTRAVENOUS | Status: AC
  Administered 2018-04-18: 250 mL via INTRAVENOUS

## 2018-04-17 MED ORDER — ENOXAPARIN SODIUM 40 MG/0.4ML ~~LOC~~ SOLN
40.0000 mg | Freq: Every day | SUBCUTANEOUS | Status: DC
  Administered 2018-04-17 – 2018-04-19 (×3): 40 mg via SUBCUTANEOUS
  Filled 2018-04-17 (×4): qty 0.4

## 2018-04-17 MED ORDER — ESCITALOPRAM OXALATE 20 MG PO TABS
20.0000 mg | ORAL_TABLET | Freq: Every day | ORAL | Status: DC
  Administered 2018-04-18 – 2018-04-20 (×3): 20 mg via ORAL
  Filled 2018-04-17 (×3): qty 1

## 2018-04-17 MED ORDER — IPRATROPIUM-ALBUTEROL 0.5-2.5 (3) MG/3ML IN SOLN
3.0000 mL | Freq: Once | RESPIRATORY_TRACT | Status: AC
  Administered 2018-04-17: 3 mL via RESPIRATORY_TRACT
  Filled 2018-04-17: qty 3

## 2018-04-17 MED ORDER — VANCOMYCIN HCL IN DEXTROSE 750-5 MG/150ML-% IV SOLN
750.0000 mg | Freq: Two times a day (BID) | INTRAVENOUS | Status: DC
  Administered 2018-04-18 – 2018-04-19 (×4): 750 mg via INTRAVENOUS
  Filled 2018-04-17 (×6): qty 150

## 2018-04-17 MED ORDER — METRONIDAZOLE IN NACL 5-0.79 MG/ML-% IV SOLN
500.0000 mg | Freq: Three times a day (TID) | INTRAVENOUS | Status: DC
  Administered 2018-04-17: 500 mg via INTRAVENOUS
  Filled 2018-04-17: qty 100

## 2018-04-17 MED ORDER — SODIUM CHLORIDE 0.9 % IV SOLN
2.0000 g | Freq: Three times a day (TID) | INTRAVENOUS | Status: DC
  Administered 2018-04-18 – 2018-04-20 (×8): 2 g via INTRAVENOUS
  Filled 2018-04-17 (×9): qty 2

## 2018-04-17 NOTE — ED Notes (Signed)
Report given to Chloe,RN

## 2018-04-17 NOTE — ED Provider Notes (Addendum)
[facility] DEPT Provider Note   CSN: [id number] Arrival date & time: 04/17/18  1235     History   Chief Complaint Chief Complaint  Patient presents with  . Hypotension  . Seizures    HPI Laura [name] is a 48 y.o. female.  HPI   48 yo F with PMHx hepatitis, polysubstance abuse, here with syncope. Pt reports that over the past 24 hours, she's felt generally weak and unwell. She has begun to have a mild cough for 24 hours due to "a cold." Earlier today, she was walking when she developed acute onset of lightheadedness. She has a h/o recurrent syncope. She felt like she was "zoned out" with tunnel vision then was shaking uncontrollably. Denies any actual LOC. She states that throughout the day since then, she feles fatigued, SOB, and tired. She's had night sweats and chills. She continues to have severe lower midline back pain radiating down her b/l legs, with loss of sensation in her legs and loss of urinary continence. Denies any trauma. Of note, she has been clean from EtOH x 56 days but did "break down" and use heroin 3 days ago - this worsened her sx. No alleviating factors.   Past Medical History:  Diagnosis Date  . Anxiety   . Depression   . ETOH abuse   . Hepatitis C   . Sebaceous cyst 03/20/2018  . Substance abuse ([place])   . Urinary incontinence 2019    Patient Active Problem List   Diagnosis Date Noted  . Seizure-like activity ([place]) 04/02/2018  . Anxiety 03/20/2018  . Decreased hearing of right ear 03/20/2018  . Cigarette smoker 05/28/2017  . IV Drug addiction ([place]) 05/27/2017  . Uterine leiomyoma 04/18/2017  . Chronic hepatitis C ([place]) 03/29/2017  . 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 Insomnia 03/29/2017  . PTSD (post-traumatic stress disorder) 08/12/2016  . History of suicide attempt   . Alcohol use disorder, severe, dependence ([place]) 07/23/2016  . Prolonged Q-T interval on ECG 07/23/2016   . 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 BACK SURGERY     x5--disabled  . KNEE SURGERY Right      OB History    Gravida  2   Para  2   Term      Preterm      AB      Living  1     SAB      TAB      Ectopic      Multiple      Live Births           Obstetric Comments  1 son deceased--drowning         Home Medications    Prior to Admission medications   Medication Sig Start Date End Date Taking? Authorizing Provider  cloNIDine (CATAPRES) 0.2 MG tablet Take 0.2 mg by mouth 3 (three) times daily.  04/17/18  Yes [provider]  escitalopram (LEXAPRO) 20 MG tablet Take 20 mg by mouth daily.  03/27/18  Yes [provider]  meloxicam (MOBIC) 15 MG tablet Take 1 tablet (15 mg total) by mouth daily. 04/02/18  Yes Vivi Barrack, MD  QUEtiapine (SEROQUEL) 200 MG tablet Take 200 mg by mouth at bedtime.  04/06/18  Yes [provider]  traZODone (DESYREL) 100 MG tablet Take 1 tablet (100 mg total)  by mouth at bedtime as needed for sleep. For sleep Patient taking differently: Take 100 mg by mouth at bedtime. For sleep 03/20/18  Yes Vivi Barrack, MD  cloNIDine (CATAPRES) 0.1 MG tablet Take 1 tablet (0.1 mg total) by mouth daily. Patient not taking: Reported on 04/17/2018 03/20/18   Vivi Barrack, MD  QUEtiapine (SEROQUEL) 400 MG tablet Take 1 tablet (400 mg total) by mouth at bedtime. Patient not taking: Reported on 04/17/2018 03/20/18   Vivi Barrack, MD  tiZANidine (ZANAFLEX) 4 MG tablet Take 1 tablet (4 mg total) by mouth every 6 (six) hours as needed for muscle spasms. Patient not taking: Reported on 04/17/2018 04/02/18   Vivi Barrack, MD    Family History Family History  Family history unknown: Yes    Social History Social History   Tobacco Use  . Smoking status: Current Every Day Smoker    Packs/day: 0.50    Years: 20.00    Pack  years: 10.00    Types: Cigarettes  . Smokeless tobacco: Never Used  Substance Use Topics  . Alcohol use: Not Currently  . Drug use: Not Currently     Allergies   Patient has no known allergies.   Review of Systems Review of Systems  Constitutional: Positive for chills, fatigue and fever.  HENT: Negative for congestion, rhinorrhea and sore throat.   Eyes: Negative for visual disturbance.  Respiratory: Positive for cough, shortness of breath and wheezing.   Cardiovascular: Negative for chest pain and leg swelling.  Gastrointestinal: Positive for nausea. Negative for abdominal pain, diarrhea and vomiting.  Genitourinary: Negative for dysuria, flank pain, vaginal bleeding and vaginal discharge.  Musculoskeletal: Positive for back pain. Negative for neck pain.  Skin: Negative for rash.  Allergic/Immunologic: Negative for immunocompromised state.  Neurological: Positive for dizziness, light-headedness and numbness. Negative for syncope and headaches.  Hematological: Does not bruise/bleed easily.  All other systems reviewed and are negative.    Physical Exam Updated Vital Signs BP 91/64   Pulse 65   Temp 97.7 F (36.5 C) (Oral)   Resp 17   Ht 5\' 8"  (1.727 m)   Wt 74.8 kg   LMP 04/17/2017   SpO2 95%   BMI 25.09 kg/m   Physical Exam Vitals signs and nursing note reviewed.  Constitutional:      General: She is not in acute distress.    Appearance: She is well-developed. She is ill-appearing.  HENT:     Head: Normocephalic and atraumatic.     Mouth/Throat:     Mouth: Mucous membranes are dry.  Eyes:     Conjunctiva/sclera: Conjunctivae normal.  Neck:     Musculoskeletal: Neck supple.  Cardiovascular:     Rate and Rhythm: Normal rate and regular rhythm.     Heart sounds: Normal heart sounds. No murmur. No friction rub.  Pulmonary:     Effort: No respiratory distress.     Breath sounds: Wheezing and rales (diffuse, bilateral) present.     Comments: Mild  tachypnea  Abdominal:     General: There is no distension.     Palpations: Abdomen is soft.     Tenderness: There is no abdominal tenderness.  Skin:    General: Skin is warm.     Capillary Refill: Capillary refill takes less than 2 seconds.     Comments: Track marks LUE  Neurological:     Mental Status: She is alert.     Motor: No abnormal muscle tone.  Comments: Drowsy but arousable. MAE with 5/5 strength. Face symmetric, speech slightly slurred. Normal sensation to light touch.      ED Treatments / Results  Labs (all labs ordered are listed, but only abnormal results are displayed) Labs Reviewed  LACTIC ACID, PLASMA - Abnormal; Notable for the following components:      Result Value   Lactic Acid, Venous 2.4 (*)    All other components within normal limits  CBC WITH DIFFERENTIAL/PLATELET - Abnormal; Notable for the following components:   WBC 18.2 (*)    RBC 3.83 (*)    Hemoglobin 11.9 (*)    Neutro Abs 14.0 (*)    Abs Immature Granulocytes 0.10 (*)    All other components within normal limits  COMPREHENSIVE METABOLIC PANEL - Abnormal; Notable for the following components:   Sodium 133 (*)    Glucose, Bld 169 (*)    BUN 23 (*)    Creatinine, Ser 1.01 (*)    Calcium 8.8 (*)    AST 42 (*)    All other components within normal limits  CBG MONITORING, ED - Abnormal; Notable for the following components:   Glucose-Capillary 184 (*)    All other components within normal limits  CULTURE, BLOOD (ROUTINE X 2)  CULTURE, BLOOD (ROUTINE X 2)  URINE CULTURE  RAPID URINE DRUG SCREEN, HOSP PERFORMED  TROPONIN I  URINALYSIS, ROUTINE W REFLEX MICROSCOPIC  SEDIMENTATION RATE  C-REACTIVE PROTEIN  BRAIN NATRIURETIC PEPTIDE  I-STAT BETA HCG BLOOD, ED ([facility], [facility], [facility] ONLY)    EKG EKG Interpretation  Date/Time:  Thursday April 17 2018 13:52:50 EST Ventricular Rate:  65 PR Interval:    QRS Duration: 93 QT Interval:  466 QTC Calculation: 485 R Axis:   98 Text  Interpretation:  Sinus rhythm Borderline right axis deviation Minimal ST elevation, inferior leads No significant change since last tracing Confirmed by Duffy Bruce [phone]) on 04/17/2018 2:13:02 PM   Radiology Dg Chest Portable 1 View  Result Date: 04/17/2018 CLINICAL DATA:  Shortness of breath. EXAM: PORTABLE CHEST 1 VIEW COMPARISON:  Radiograph of January 07, 2017. FINDINGS: Cardiomediastinal silhouette appears normal. Bilateral perihilar airspace opacities are noted concerning for possible pneumonia or edema. No pneumothorax or pleural effusion is noted. Bony thorax is unremarkable. IMPRESSION: Mild bilateral perihilar opacities are noted concerning for edema or possibly pneumonia. Follow-up radiographs are recommended to ensure resolution and rule out neoplasm or malignancy. Electronically Signed   By: Marijo Conception, M.D.   On: 04/17/2018 15:16    Procedures .Critical Care Performed by: Duffy Bruce, MD Authorized by: Duffy Bruce, MD   Critical care provider statement:    Critical care time (minutes):  35   Critical care time was exclusive of:  Separately billable procedures and treating other patients and teaching time   Critical care was necessary to treat or prevent imminent or life-threatening deterioration of the following conditions:  Cardiac failure, circulatory failure and sepsis   Critical care was time spent personally by me on the following activities:  Development of treatment plan with patient or surrogate, discussions with consultants, evaluation of patient's response to treatment, examination of patient, obtaining history from patient or surrogate, ordering and performing treatments and interventions, ordering and review of laboratory studies, ordering and review of radiographic studies, pulse oximetry, re-evaluation of patient's condition and review of old charts   I assumed direction of critical care for this patient from another provider in my specialty: no      (including critical care  time)  Medications Ordered in ED Medications  metroNIDAZOLE (FLAGYL) IVPB 500 mg (has no administration in time range)  vancomycin (VANCOCIN) 1,500 mg in sodium chloride 0.9 % 500 mL IVPB (1,500 mg Intravenous New Bag/Given 04/17/18 1538)  ipratropium-albuterol (DUONEB) 0.5-2.5 (3) MG/3ML nebulizer solution 3 mL (has no administration in time range)  sodium chloride 0.9 % bolus 1,000 mL (1,000 mLs Intravenous New Bag/Given 04/17/18 1446)  sodium chloride 0.9 % bolus 1,000 mL (1,000 mLs Intravenous New Bag/Given 04/17/18 1335)  ceFEPIme (MAXIPIME) 2 g in sodium chloride 0.9 % 100 mL IVPB (2 g Intravenous New Bag/Given 04/17/18 1444)  sodium chloride 0.9 % bolus 500 mL (500 mLs Intravenous New Bag/Given 04/17/18 1450)  naloxone (NARCAN) injection 0.4 mg (0.4 mg Intravenous Given 04/17/18 1513)     Initial Impression / Assessment and Plan / ED Course  I have reviewed the triage vital signs and the nursing notes.  Pertinent labs & imaging results that were available during my care of the patient were reviewed by me and considered in my medical decision making (see chart for details).     48 yo F here with syncopal episode. On arrival, pt afebrile but markedly hypotensive, hypoxic. She has a significant leukocytosis and lactic acidosis. Concern for sepsis of unknown source, with concern for bacteremia given her heroin use. CXR concerning for edema or PNA. No h/o CHF - likely infectious. Code sepsis initiated.  BP improving with fluids, mentation improved w/ Narcan. Broad spectrum ABX, fluids given. I suspect she can be admitted to step down if she remains stable. However, given her presentation, will check CT Head (for AMS) as well as CT Angio given her significant hypoxia, syncope, hypotension.   OF NOTE, I suspect pt will also need MR Lumbar spine WITH contrast as an inpt - her previous MRI was done without and given her IVDU, I'm concerned for epidural. Hesitant to send her  now given clinical instability but once stable, would recommend.  Patient care transferred to Dr. Regenia Skeeter at the end of my shift. Patient presentation, ED course, and plan of care discussed with review of all pertinent labs and imaging. Please see his/her note for further details regarding further ED course and disposition.   Final Clinical Impressions(s) / ED Diagnoses   Final diagnoses:  Sepsis without acute organ dysfunction, due to unspecified organism ([place])  Lactic acidosis  Acute respiratory failure with hypoxia [facility])    ED Discharge Orders    None       Duffy Bruce, MD 04/17/18 1557    Duffy Bruce, MD 04/30/18 1541

## 2018-04-17 NOTE — ED Triage Notes (Signed)
Patient states she had a seizure an hour ago. patient states, "I was shaking and peed my pants." Patient hypotensive in triage. paitent was dropped off my her father.

## 2018-04-17 NOTE — ED Notes (Signed)
Previous order to collect subsequent lactic acid did to repopulate or cross over through the system. Recollection of the second lactic was recollected and resent to lab.

## 2018-04-17 NOTE — ED Notes (Signed)
ED TO INPATIENT HANDOFF REPORT  Name/Age/Gender Laura [name] 48 y.o. female  Code Status Code Status History    Date Active Date Inactive Code Status Order ID Comments User Context   01/14/2017 1605 01/17/2017 1628 Full Code [id number]  Derrill Center, NP Inpatient   01/08/2017 0044 01/14/2017 1455 Full Code [id number]  Rise Patience, MD ED   11/01/2016 1659 11/05/2016 1758 Full Code [id number]  Ethelene Hal, NP Inpatient   11/01/2016 0027 11/01/2016 1633 Full Code [id number]  Renita Papa, PA-C ED   08/11/2016 2047 08/14/2016 1733 Full Code [id number]  Clovis Fredrickson, MD Inpatient   08/10/2016 1859 08/11/2016 1933 Full Code [id number]  Orlie Dakin, MD ED   08/10/2016 1716 08/10/2016 1859 Full Code [id number]  Blanchie Dessert, MD ED   07/21/2016 0112 07/25/2016 1711 Full Code [id number]  Rozetta Nunnery, NP Inpatient   07/20/2016 1655 07/21/2016 0041 Full Code [id number]  Delia Heady, PA-C ED      Home/SNF/Other Home  Chief Complaint seizure  Level of Care/Admitting Diagnosis ED Disposition    ED Disposition Condition [facility]: [facility] [[id number]]  Level of Care: Stepdown [14]  Admit to SDU based on following criteria: Severe physiological/psychological symptoms:  Any diagnosis requiring assessment & intervention at least every 4 hours on an ongoing basis to obtain desired patient outcomes including stability and rehabilitation  Diagnosis: Bacteremia [790.7.ICD-9-CM]  Admitting Physician: Nita Sells [phone]  Attending Physician: Nita Sells [phone]  Estimated length of stay: 3 - 4 days  Certification:: I certify this patient will need inpatient services for at least 2 midnights  PT Class (Do Not Modify): Inpatient [101]  PT Acc Code (Do Not Modify): Private [1]       Medical History Past Medical History:  Diagnosis Date  . Anxiety   . Depression   . ETOH abuse   . Hepatitis C   . Sebaceous cyst  03/20/2018  . Substance abuse ([place])   . Urinary incontinence 2019    Allergies No Known Allergies  IV Location/Drains/Wounds Patient Lines/Drains/Airways Status   Active Line/Drains/Airways    Name:   Placement date:   Placement time:   Site:   Days:   Peripheral IV 04/17/18 Right Antecubital   04/17/18    1322    Antecubital   less than 1          Labs/Imaging Results for orders placed or performed during the hospital encounter of 04/17/18 (from the past 48 hour(s))  Lactic acid, plasma     Status: Abnormal   Collection Time: 04/17/18  1:27 PM  Result Value Ref Range   Lactic Acid, Venous 2.4 (HH) 0.5 - 1.9 mmol/L    Comment: CRITICAL RESULT CALLED TO, READ BACK BY AND VERIFIED WITH: S.BINGHAM RN AT 6269 ON 04/17/18 BY S.VANHOORNE Performed at [facility], [place] [address] [place], [place] [address]   CBC with Differential     Status: Abnormal   Collection Time: 04/17/18  1:46 PM  Result Value Ref Range   WBC 18.2 (H) 4.0 - 10.5 K/uL   RBC 3.83 (L) 3.87 - 5.11 MIL/uL   Hemoglobin 11.9 (L) 12.0 - 15.0 g/dL   HCT 37.7 36.0 - 46.0 %   MCV 98.4 80.0 - 100.0 fL   MCH 31.1 26.0 - 34.0 pg   MCHC 31.6 30.0 - 36.0 g/dL   RDW 13.0 11.5 - 15.5 %   Platelets 280 150 -  400 K/uL   nRBC 0.0 0.0 - 0.2 %   Neutrophils Relative % 77 %   Neutro Abs 14.0 (H) 1.7 - 7.7 K/uL   Lymphocytes Relative 18 %   Lymphs Abs 3.3 0.7 - 4.0 K/uL   Monocytes Relative 3 %   Monocytes Absolute 0.6 0.1 - 1.0 K/uL   Eosinophils Relative 1 %   Eosinophils Absolute 0.2 0.0 - 0.5 K/uL   Basophils Relative 0 %   Basophils Absolute 0.1 0.0 - 0.1 K/uL   Immature Granulocytes 1 %   Abs Immature Granulocytes 0.10 (H) 0.00 - 0.07 K/uL    Comment: Performed at [facility], [place] [address], [place], [place] [address]  Comprehensive metabolic panel     Status: Abnormal   Collection Time: 04/17/18  1:46 PM  Result Value Ref Range   Sodium 133 (L) 135 - 145 mmol/L   Potassium  4.0 3.5 - 5.1 mmol/L   Chloride 101 98 - 111 mmol/L   CO2 23 22 - 32 mmol/L   Glucose, Bld 169 (H) 70 - 99 mg/dL   BUN 23 (H) 6 - 20 mg/dL   Creatinine, Ser 1.01 (H) 0.44 - 1.00 mg/dL   Calcium 8.8 (L) 8.9 - 10.3 mg/dL   Total Protein 7.1 6.5 - 8.1 g/dL   Albumin 3.8 3.5 - 5.0 g/dL   AST 42 (H) 15 - 41 U/L   ALT 34 0 - 44 U/L   Alkaline Phosphatase 90 38 - 126 U/L   Total Bilirubin 1.0 0.3 - 1.2 mg/dL   GFR calc non Af Amer >60 >60 mL/min   GFR calc Af Amer >60 >60 mL/min   Anion gap 9 5 - 15    Comment: Performed at [facility], [place] [address], [place], [place] [address]  Brain natriuretic peptide     Status: None   Collection Time: 04/17/18  1:46 PM  Result Value Ref Range   B Natriuretic Peptide 62.9 0.0 - 100.0 pg/mL    Comment: Performed at [facility], [place] [address] [place], [place] [address]  CBG monitoring, ED     Status: Abnormal   Collection Time: 04/17/18  1:54 PM  Result Value Ref Range   Glucose-Capillary 184 (H) 70 - 99 mg/dL  Troponin I - ONCE - STAT     Status: None   Collection Time: 04/17/18  3:53 PM  Result Value Ref Range   Troponin I <0.03 <0.03 ng/mL    Comment: Performed at [facility], [place] [address] [place], [place] [address]  Sedimentation rate     Status: None   Collection Time: 04/17/18  3:53 PM  Result Value Ref Range   Sed Rate 17 0 - 22 mm/hr    Comment: Performed at [facility], [place] [address], [place], [place] [address]  C-reactive protein     Status: Abnormal   Collection Time: 04/17/18  3:53 PM  Result Value Ref Range   CRP 8.5 (H) <1.0 mg/dL    Comment: Performed at [facility], [place] [address] [place], [place] [address]  Rapid urine drug screen (hospital performed)     Status: Abnormal   Collection Time: 04/17/18  4:15 PM  Result Value Ref Range   Opiates POSITIVE (A) NONE DETECTED   Cocaine NONE DETECTED NONE DETECTED    Benzodiazepines NONE DETECTED NONE DETECTED   Amphetamines NONE DETECTED NONE DETECTED   Tetrahydrocannabinol NONE DETECTED NONE DETECTED  Barbiturates NONE DETECTED NONE DETECTED    Comment: (NOTE) DRUG SCREEN FOR MEDICAL PURPOSES ONLY.  IF CONFIRMATION IS NEEDED FOR ANY PURPOSE, NOTIFY LAB WITHIN 5 DAYS. LOWEST DETECTABLE LIMITS FOR URINE DRUG SCREEN Drug Class                     Cutoff (ng/mL) Amphetamine and metabolites    1000 Barbiturate and metabolites    200 Benzodiazepine                 400 Tricyclics and metabolites     300 Opiates and metabolites        300 Cocaine and metabolites        300 THC                            50 Performed at [facility], [place] [address] [place], [place] [address]   Urinalysis, Routine w reflex microscopic     Status: None   Collection Time: 04/17/18  4:15 PM  Result Value Ref Range   Color, Urine YELLOW YELLOW   APPearance CLEAR CLEAR   Specific Gravity, Urine 1.005 1.005 - 1.030   pH 6.0 5.0 - 8.0   Glucose, UA NEGATIVE NEGATIVE mg/dL   Hgb urine dipstick NEGATIVE NEGATIVE   Bilirubin Urine NEGATIVE NEGATIVE   Ketones, ur NEGATIVE NEGATIVE mg/dL   Protein, ur NEGATIVE NEGATIVE mg/dL   Nitrite NEGATIVE NEGATIVE   Leukocytes, UA NEGATIVE NEGATIVE    Comment: Performed at [place] [address], [place], [place] [address]  Influenza panel by PCR (type A & B)     Status: None   Collection Time: 04/17/18  6:49 PM  Result Value Ref Range   Influenza A By PCR NEGATIVE NEGATIVE   Influenza B By PCR NEGATIVE NEGATIVE    Comment: (NOTE) The Xpert Xpress Flu assay is intended as an aid in the diagnosis of  influenza and should not be used as a sole basis for treatment.  This  assay is FDA approved for nasopharyngeal swab specimens only. Nasal  washings and aspirates are unacceptable for Xpert Xpress Flu testing. Performed at [facility], [place] [address], [place],  [place] [address]   Lactic acid, plasma     Status: None   Collection Time: 04/17/18  7:18 PM  Result Value Ref Range   Lactic Acid, Venous 1.8 0.5 - 1.9 mmol/L    Comment: Performed at [facility], [place] [address], [place], [place] [address]   Ct Head Wo Contrast  Result Date: 04/17/2018 CLINICAL DATA:  Recent seizure activity EXAM: CT HEAD WITHOUT CONTRAST TECHNIQUE: Contiguous axial images were obtained from the base of the skull through the vertex without intravenous contrast. COMPARISON:  None FINDINGS: Brain: No evidence of acute infarction, hemorrhage, hydrocephalus, extra-axial collection or mass lesion/mass effect. Vascular: No hyperdense vessel or unexpected calcification. Skull: Normal. Negative for fracture or focal lesion. Sinuses/Orbits: No acute finding. Other: Calcified subcutaneous nodule is noted to the right of the midline in frontal scalp. IMPRESSION: No acute intracranial abnormality noted. Electronically Signed   By: Inez Catalina M.D.   On: 04/17/2018 17:08   Ct Angio Chest Pe W And/or Wo Contrast  Result Date: 04/17/2018 CLINICAL DATA:  Shortness of breath and fatigue EXAM: CT ANGIOGRAPHY CHEST WITH CONTRAST TECHNIQUE: Multidetector CT imaging of the chest was performed using the standard protocol during bolus administration of  intravenous contrast. Multiplanar CT image reconstructions and MIPs were obtained to evaluate the vascular anatomy. CONTRAST:  1110mL ISOVUE-370 COMPARISON:  Plain film from earlier in the same day. FINDINGS: Cardiovascular: Thoracic aorta is within normal limits. The pulmonary artery shows a normal branching pattern without intraluminal filling defect to suggest pulmonary embolism. No pericardial effusion is seen. No cardiac enlargement is noted. No significant coronary calcifications are seen. Mediastinum/Nodes: Thoracic inlet is within normal limits. Scattered small hilar lymph nodes are noted bilaterally likely reactive in nature given the  changes in lungs bilaterally. The esophagus is within normal limits. Lungs/Pleura: Lungs are well aerated bilaterally without focal effusion. Patchy infiltrates are noted throughout both lungs primarily in the upper lobes. No sizable parenchymal nodules are noted. Upper Abdomen: Visualized upper abdomen is within normal limits. Musculoskeletal: Degenerative changes of the thoracic spine are noted. No acute bony abnormality is seen. Review of the MIP images confirms the above findings. IMPRESSION: No evidence of pulmonary emboli. Diffuse primarily upper lobe infiltrate bilaterally. Reactive hilar and mediastinal lymph nodes are noted. Electronically Signed   By: Inez Catalina M.D.   On: 04/17/2018 17:07   Dg Chest Portable 1 View  Result Date: 04/17/2018 CLINICAL DATA:  Shortness of breath. EXAM: PORTABLE CHEST 1 VIEW COMPARISON:  Radiograph of January 07, 2017. FINDINGS: Cardiomediastinal silhouette appears normal. Bilateral perihilar airspace opacities are noted concerning for possible pneumonia or edema. No pneumothorax or pleural effusion is noted. Bony thorax is unremarkable. IMPRESSION: Mild bilateral perihilar opacities are noted concerning for edema or possibly pneumonia. Follow-up radiographs are recommended to ensure resolution and rule out neoplasm or malignancy. Electronically Signed   By: Marijo Conception, M.D.   On: 04/17/2018 15:16    Pending Labs Unresulted Labs (From admission, onward)    Start     Ordered   04/17/18 1900  Lactic acid, plasma  Now then every 2 hours,   STAT     04/17/18 1901   04/17/18 1804  Respiratory Panel by PCR  (Respiratory virus panel with precautions)  Once,   R     04/17/18 1803   04/17/18 1403  Blood Culture (routine x 2)  BLOOD CULTURE X 2,   STAT    Question:  Patient immune status  Answer:  Normal   04/17/18 1403   04/17/18 1403  Urine culture  ONCE - STAT,   STAT    Question:  Patient immune status  Answer:  Normal   04/17/18 1403   Signed and Held   Procalcitonin  Tomorrow morning,   R     Signed and Held   Signed and Held  CBC  (enoxaparin (LOVENOX)    CrCl >/= 30 ml/min)  Once,   R    Comments:  Baseline for enoxaparin therapy IF NOT ALREADY DRAWN.  Notify MD if PLT < 100 K.    Signed and Held   Signed and Held  Creatinine, serum  (enoxaparin (LOVENOX)    CrCl >/= 30 ml/min)  Once,   R    Comments:  Baseline for enoxaparin therapy IF NOT ALREADY DRAWN.    Signed and Held   Signed and Held  Creatinine, serum  (enoxaparin (LOVENOX)    CrCl >/= 30 ml/min)  Weekly,   R    Comments:  while on enoxaparin therapy    Signed and Held   Signed and Held  Comprehensive metabolic panel  Tomorrow morning,   R     Signed and Held   Signed  and Held  Magnesium  Tomorrow morning,   R     Signed and Held   Signed and Held  Phosphorus  Tomorrow morning,   R     Signed and Held   Signed and Held  CBC WITH DIFFERENTIAL  Tomorrow morning,   R     Signed and Held   Signed and Held  Lactic acid, plasma  STAT Now then every 3 hours,   STAT     Signed and Held          Vitals/Pain Today's Vitals   04/17/18 1730 04/17/18 1736 04/17/18 1745 04/17/18 1900  BP: 125/66 125/66 (!) 92/51   Pulse: 100 95 90 (!) 105  Resp:  19    Temp:      TempSrc:      SpO2: (!) 88% 90% 91% 91%  Weight:      Height:      PainSc:        Isolation Precautions Droplet precaution  Medications Medications  metroNIDAZOLE (FLAGYL) IVPB 500 mg (500 mg Intravenous New Bag/Given 04/17/18 1818)  sodium chloride (PF) 0.9 % injection (has no administration in time range)  iopamidol (ISOVUE-370) 76 % injection (has no administration in time range)  oseltamivir (TAMIFLU) capsule 75 mg (75 mg Oral Given 04/17/18 1851)  ceFEPIme (MAXIPIME) 2 g in sodium chloride 0.9 % 100 mL IVPB (has no administration in time range)  vancomycin (VANCOCIN) IVPB 750 mg/150 ml premix (has no administration in time range)  sodium chloride 0.9 % bolus 1,000 mL (0 mLs Intravenous Stopped 04/17/18  2010)  sodium chloride 0.9 % bolus 1,000 mL (0 mLs Intravenous Stopped 04/17/18 1436)  ceFEPIme (MAXIPIME) 2 g in sodium chloride 0.9 % 100 mL IVPB (0 g Intravenous Stopped 04/17/18 1514)  sodium chloride 0.9 % bolus 500 mL (0 mLs Intravenous Stopped 04/17/18 1551)  vancomycin (VANCOCIN) 1,500 mg in sodium chloride 0.9 % 500 mL IVPB (0 mg Intravenous Stopped 04/17/18 2010)  naloxone (NARCAN) injection 0.4 mg (0.4 mg Intravenous Given 04/17/18 1513)  ipratropium-albuterol (DUONEB) 0.5-2.5 (3) MG/3ML nebulizer solution 3 mL (3 mLs Nebulization Given 04/17/18 1715)  iopamidol (ISOVUE-370) 76 % injection 100 mL (100 mLs Intravenous Contrast Given 04/17/18 1638)    Mobility walks

## 2018-04-17 NOTE — Telephone Encounter (Signed)
See note

## 2018-04-17 NOTE — ED Notes (Signed)
Attempted to give report to Santiago Glad, Therapist, sports, she will call back.

## 2018-04-17 NOTE — Telephone Encounter (Signed)
Patient called and says "I just had a seizure. It lasted 10 minutes. I was getting out of the shower and getting dressed when it happened." I asked did she sustain injury, she denies. I asked about other symptoms, she says "I was jerking during the seizure and got a little stiff. Now my speech is slurred and I'm dizzy." I asked is someone with her, she says her father. I advised to have him take her to the ED now, she verbalized understanding.   Answer Assessment - Initial Assessment Questions 1. ONSET: "How long did the seizure last?" (Minutes)      10 minutes 2. CONTENT: "Describe what happened during the seizure. Did the body become stiff? Was there any jerking?"      Jerking during the seizure, stiff after it 3. CIRCUMSTANCE: "What was the individual doing when the seizure began?"      Taking a shower 4. MENTAL STATUS: "Does he know who he is, who you are, and where he is?"      Yes 5. PRIOR SEIZURES: "Has the individual had a seizure (convulsion) before?" If so, ask: "When was the last time?" and "What happened last time?"     When I was a kid 6. EPILEPSY: "Does the individual have epilepsy?" (note: check for medical ID bracelet)     No 7. MEDICATIONS: "Does the individual take anticonvulsant medications?" (e.g., yes/no, compliance, any recent changes)     No 8. INJURY: "Did the individual hurt himself during the seizure?" (e.g., head, tongue)     No 9. OTHER SYMPTOMS: "Are there any other symptoms?" (e.g., fever, headache)     Dizziness, slurred speech 10. PREGNANCY: "Is there any chance you are pregnant?" "When was your last menstrual period?"       No  Protocols used: [facility]

## 2018-04-17 NOTE — ED Notes (Signed)
Attempted to give report to [place], Therapist, sports. She will call back when ready.

## 2018-04-17 NOTE — ED Notes (Signed)
Admitting DR paged, he is changing her bed to a tele bed with stable vitals and a good second lactic.

## 2018-04-17 NOTE — ED Provider Notes (Signed)
Care transferred to me.  The patient's blood pressure is currently 481 systolic.  She appears to be mentating well and is not in respiratory distress.  CT scan does not show PE but does show significant bilateral pneumonia.  She will be treated as sepsis and will need admission to the hospitalist service.    Results for orders placed or performed during the hospital encounter of 04/17/18  Lactic acid, plasma  Result Value Ref Range   Lactic Acid, Venous 2.4 (HH) 0.5 - 1.9 mmol/L  CBC with Differential  Result Value Ref Range   WBC 18.2 (H) 4.0 - 10.5 K/uL   RBC 3.83 (L) 3.87 - 5.11 MIL/uL   Hemoglobin 11.9 (L) 12.0 - 15.0 g/dL   HCT 37.7 36.0 - 46.0 %   MCV 98.4 80.0 - 100.0 fL   MCH 31.1 26.0 - 34.0 pg   MCHC 31.6 30.0 - 36.0 g/dL   RDW 13.0 11.5 - 15.5 %   Platelets 280 150 - 400 K/uL   nRBC 0.0 0.0 - 0.2 %   Neutrophils Relative % 77 %   Neutro Abs 14.0 (H) 1.7 - 7.7 K/uL   Lymphocytes Relative 18 %   Lymphs Abs 3.3 0.7 - 4.0 K/uL   Monocytes Relative 3 %   Monocytes Absolute 0.6 0.1 - 1.0 K/uL   Eosinophils Relative 1 %   Eosinophils Absolute 0.2 0.0 - 0.5 K/uL   Basophils Relative 0 %   Basophils Absolute 0.1 0.0 - 0.1 K/uL   Immature Granulocytes 1 %   Abs Immature Granulocytes 0.10 (H) 0.00 - 0.07 K/uL  Comprehensive metabolic panel  Result Value Ref Range   Sodium 133 (L) 135 - 145 mmol/L   Potassium 4.0 3.5 - 5.1 mmol/L   Chloride 101 98 - 111 mmol/L   CO2 23 22 - 32 mmol/L   Glucose, Bld 169 (H) 70 - 99 mg/dL   BUN 23 (H) 6 - 20 mg/dL   Creatinine, Ser 1.01 (H) 0.44 - 1.00 mg/dL   Calcium 8.8 (L) 8.9 - 10.3 mg/dL   Total Protein 7.1 6.5 - 8.1 g/dL   Albumin 3.8 3.5 - 5.0 g/dL   AST 42 (H) 15 - 41 U/L   ALT 34 0 - 44 U/L   Alkaline Phosphatase 90 38 - 126 U/L   Total Bilirubin 1.0 0.3 - 1.2 mg/dL   GFR calc non Af Amer >60 >60 mL/min   GFR calc Af Amer >60 >60 mL/min   Anion gap 9 5 - 15  Rapid urine drug screen (hospital performed)  Result Value Ref Range    Opiates POSITIVE (A) NONE DETECTED   Cocaine NONE DETECTED NONE DETECTED   Benzodiazepines NONE DETECTED NONE DETECTED   Amphetamines NONE DETECTED NONE DETECTED   Tetrahydrocannabinol NONE DETECTED NONE DETECTED   Barbiturates NONE DETECTED NONE DETECTED  Troponin I - ONCE - STAT  Result Value Ref Range   Troponin I <0.03 <0.03 ng/mL  Urinalysis, Routine w reflex microscopic  Result Value Ref Range   Color, Urine YELLOW YELLOW   APPearance CLEAR CLEAR   Specific Gravity, Urine 1.005 1.005 - 1.030   pH 6.0 5.0 - 8.0   Glucose, UA NEGATIVE NEGATIVE mg/dL   Hgb urine dipstick NEGATIVE NEGATIVE   Bilirubin Urine NEGATIVE NEGATIVE   Ketones, ur NEGATIVE NEGATIVE mg/dL   Protein, ur NEGATIVE NEGATIVE mg/dL   Nitrite NEGATIVE NEGATIVE   Leukocytes, UA NEGATIVE NEGATIVE  Brain natriuretic peptide  Result Value Ref Range  B Natriuretic Peptide 62.9 0.0 - 100.0 pg/mL  CBG monitoring, ED  Result Value Ref Range   Glucose-Capillary 184 (H) 70 - 99 mg/dL   Ct Head Wo Contrast  Result Date: 04/17/2018 CLINICAL DATA:  Recent seizure activity EXAM: CT HEAD WITHOUT CONTRAST TECHNIQUE: Contiguous axial images were obtained from the base of the skull through the vertex without intravenous contrast. COMPARISON:  None FINDINGS: Brain: No evidence of acute infarction, hemorrhage, hydrocephalus, extra-axial collection or mass lesion/mass effect. Vascular: No hyperdense vessel or unexpected calcification. Skull: Normal. Negative for fracture or focal lesion. Sinuses/Orbits: No acute finding. Other: Calcified subcutaneous nodule is noted to the right of the midline in frontal scalp. IMPRESSION: No acute intracranial abnormality noted. Electronically Signed   By: Inez Catalina M.D.   On: 04/17/2018 17:08   Ct Angio Chest Pe W And/or Wo Contrast  Result Date: 04/17/2018 CLINICAL DATA:  Shortness of breath and fatigue EXAM: CT ANGIOGRAPHY CHEST WITH CONTRAST TECHNIQUE: Multidetector CT imaging of the  chest was performed using the standard protocol during bolus administration of intravenous contrast. Multiplanar CT image reconstructions and MIPs were obtained to evaluate the vascular anatomy. CONTRAST:  13mL [id number] COMPARISON:  Plain film from earlier in the same day. FINDINGS: Cardiovascular: Thoracic aorta is within normal limits. The pulmonary artery shows a normal branching pattern without intraluminal filling defect to suggest pulmonary embolism. No pericardial effusion is seen. No cardiac enlargement is noted. No significant coronary calcifications are seen. Mediastinum/Nodes: Thoracic inlet is within normal limits. Scattered small hilar lymph nodes are noted bilaterally likely reactive in nature given the changes in lungs bilaterally. The esophagus is within normal limits. Lungs/Pleura: Lungs are well aerated bilaterally without focal effusion. Patchy infiltrates are noted throughout both lungs primarily in the upper lobes. No sizable parenchymal nodules are noted. Upper Abdomen: Visualized upper abdomen is within normal limits. Musculoskeletal: Degenerative changes of the thoracic spine are noted. No acute bony abnormality is seen. Review of the MIP images confirms the above findings. IMPRESSION: No evidence of pulmonary emboli. Diffuse primarily upper lobe infiltrate bilaterally. Reactive hilar and mediastinal lymph nodes are noted. Electronically Signed   By: Inez Catalina M.D.   On: 04/17/2018 17:07   Mr Lumbar Spine Wo Contrast  Result Date: 04/04/2018 CLINICAL DATA:  Syncope. Sudden back pain with lower extremity weakness. EXAM: MRI LUMBAR SPINE WITHOUT CONTRAST TECHNIQUE: Multiplanar, multisequence MR imaging of the lumbar spine was performed. No intravenous contrast was administered. COMPARISON:  None. FINDINGS: Segmentation: There are 5 lumbar vertebral bodies. There is an incomplete disc space at L4-L5. The lowest disc space is L5-S1. Alignment:  There is levoscoliosis with apex at L4.  Vertebrae: No acute compression fracture, discitis-osteomyelitis of focal marrow lesion. Conus medullaris and cauda equina: The conus medullaris terminates at the L2 level. The cauda equina and conus medullaris are both normal. Paraspinal and other soft tissues: The visualized retroperitoneal organs and paraspinal soft tissues are normal. Disc levels: Sagittal plane imaging includes the T11-12 disc level through the upper sacrum, with axial imaging of the T12-L1 to L5-S1 disc levels. T11-L1: Normal. L1-2: Disc space narrowing with small bulge. No spinal canal or neural foraminal stenosis. L2-3: Normal disc space. Mild left facet hypertrophy. No spinal canal or neural foraminal stenosis. L3-4: Left eccentric disc bulge with mild bilateral facet hypertrophy. Mild spinal canal stenosis and narrowing of the lateral recesses. Mild right and moderate left neural foraminal stenosis. L4-5: Incomplete disc space. Right eccentric disc osteophyte complex with severe narrowing of the  right neural foramen. No central spinal canal or left neural foraminal stenosis. Moderate right facet hypertrophy. L5-S1: Disc space narrowing with mild bulge. Mild bilateral neural foraminal stenosis. The visualized portion of the sacrum is normal. IMPRESSION: 1. No acute abnormality of the lumbar spine 2. 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 3. L3-4 mild spinal canal stenosis with mild right, moderate left neural foraminal stenosis. 4. Mild bilateral L5-S1 neural foraminal stenosis. Electronically Signed   By: Ulyses Jarred M.D.   On: 04/04/2018 02:15   Dg Chest Portable 1 View  Result Date: 04/17/2018 CLINICAL DATA:  Shortness of breath. EXAM: PORTABLE CHEST 1 VIEW COMPARISON:  Radiograph of January 07, 2017. FINDINGS: Cardiomediastinal silhouette appears normal. Bilateral perihilar airspace opacities are noted concerning for possible pneumonia or edema. No pneumothorax or pleural effusion is noted. Bony thorax is unremarkable.  IMPRESSION: Mild bilateral perihilar opacities are noted concerning for edema or possibly pneumonia. Follow-up radiographs are recommended to ensure resolution and rule out neoplasm or malignancy. Electronically Signed   By: Marijo Conception, M.D.   On: 04/17/2018 15:16      Sherwood Gambler, MD 04/17/18 1850

## 2018-04-17 NOTE — H&P (Addendum)
HPI  Laura [name] OAC:[id number] DOB: 07-05-1970 DOA: 04/17/2018  PCP: Vivi Barrack, MD   Chief Complaint: Syncope  HPI:  48 year old Caucasian female previously from [place] moved here last year 2019 when her 5 year old son died from an accidental drowning Chronic hep C Prior back surgeries with opiate dependence and previously on methadone with 5 back surgeries in the past Hypertension Prior heavy ethanolism Free from heroin until 3 to 4 days prior to this hospitalization-has been snorting but recently injecting  Came to the emergency room because she had a fainting spell where she Feels that she is also been coughing and had a cold she has had recurrent syncope as well which has been apparently worked up by neurologist who she was referred to but they did not find anything she had some night sweats as well  She also had an episode of bilateral severe midline pain with loss of sensation and some loss of continence however she has it right now Note that she was seen 1/17 with foraminal stenosis L4-L5 which was severe and she has right-sided pain she was referred to neurosurgery as an outpatient and has been on Mobic for the same  In the emergency room she was found to be hypotensive and was shaking uncontrollably markedly hypotensive to the 80 range also hypoxic in the 80 range-she was started on broad-spectrum antibiotics White count 18 BUN/creatinine elevated 23/1.0 glucose 69 AST 142 lactic acid 2.4 CT of head was done because of syncope no findings CT of the chest to rule out PE was done showed possible upper lobe infiltrate bilaterally    ED Course: As above  Review of Systems:  She has been having fever, chills and sore throat she also has some muscle aches and pains she also is having some rigors and jitteriness She had loss of bowel sensation but now has regained Past Medical History:  Diagnosis Date  . Anxiety   . Depression   . ETOH abuse   . Hepatitis C     . Sebaceous cyst 03/20/2018  . Substance abuse ([place])   . Urinary incontinence 2019    Past Surgical History:  Procedure Laterality Date  . BACK SURGERY     x5--disabled  . KNEE SURGERY Right      reports that she has been smoking cigarettes. She has a 10.00 pack-year smoking history. She has never used smokeless tobacco. She reports previous alcohol use. She reports previous drug use. Mobility: Independent Injected heroin recently  No Known Allergies  Family History  Family history unknown: Yes     Prior to Admission medications   Medication Sig Start Date End Date Taking? Authorizing Provider  cloNIDine (CATAPRES) 0.2 MG tablet Take 0.2 mg by mouth 3 (three) times daily.  04/17/18  Yes [provider]  escitalopram (LEXAPRO) 20 MG tablet Take 20 mg by mouth daily.  03/27/18  Yes [provider]  meloxicam (MOBIC) 15 MG tablet Take 1 tablet (15 mg total) by mouth daily. 04/02/18  Yes Vivi Barrack, MD  QUEtiapine (SEROQUEL) 200 MG tablet Take 200 mg by mouth at bedtime.  04/06/18  Yes [provider]  traZODone (DESYREL) 100 MG tablet Take 1 tablet (100 mg total) by mouth at bedtime as needed for sleep. For sleep Patient taking differently: Take 100 mg by mouth at bedtime. For sleep 03/20/18  Yes Vivi Barrack, MD  cloNIDine (CATAPRES) 0.1 MG tablet Take 1 tablet (0.1 mg total) by mouth daily. Patient  not taking: Reported on 04/17/2018 03/20/18   Vivi Barrack, MD  QUEtiapine (SEROQUEL) 400 MG tablet Take 1 tablet (400 mg total) by mouth at bedtime. Patient not taking: Reported on 04/17/2018 03/20/18   Vivi Barrack, MD  tiZANidine (ZANAFLEX) 4 MG tablet Take 1 tablet (4 mg total) by mouth every 6 (six) hours as needed for muscle spasms. Patient not taking: Reported on 04/17/2018 04/02/18   Vivi Barrack, MD    Physical Exam:  Vitals:   04/17/18 1701 04/17/18 1736  BP:  125/66  Pulse:  95  Resp:  19  Temp: 98.7 F (37.1 C)   SpO2:  90%      EOMI NCAT alert oriented no distress  Anxious appearing jittery and slightly tremulous  Neck is soft supple although sore although I do not feel any submandibular lymphadenopathy examination of TMs was deferred  No thyromegaly  S1-S2 slightly tachycardic  Chest is clinically clear no added sound  Abdomen soft nontender nondistended  She has normal sensation lower extremities and brisk reflexes in the knees she is able to sense cold touch  And proprioception  I have personally reviewed following labs and imaging studies  Labs:   As above  Imaging studies:   As above  Medical tests:   EKG independently reviewed: Sinus bradycardia  Test discussed with performing physician:  Yes discussed with ED physician Dr. 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 trend lactic acid and admit to stepdown unit keep on saline 125 cc an hour for next 18 hours Because she came in with a cough and this is flu season I will empirically start Tamiflu and check for influenza and respiratory viral panel we can discontinue Tamiflu if this is the case ?  Endocarditis-get echocardiogram as above Chronic hep C Would consider is immunocompromised-needs outpatient follow-up and abstinence Chronic pain No opiates will be given if she has pain she can get Toradol and she can use this over and above her meloxicam AKI BUN/creatinine is above her  baseline we will hydrate her copiously and repeat labs in the morning Concern for seizure I think she is presyncopal and has had some hypotension I think that she is falling mainly because of her deficiency of strength secondary to foraminal stenosis and she may need neurosurgery to see her certainly we can get an MRI of the back at a later time right now let stabilize her in stepdown unit Smoker Outpatient should be counseled by her physician Hypertension- At this time discontinuing clonidine 0.2 3 times a day 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 Inpatient status is judged to be reasonable and necessary in order to provide  the required intensity of service to ensure the patient's safety. The patient's presenting symptoms, physical exam findings, and initial radiographic and laboratory data in the context of their chronic comorbidities is felt to place them at high risk for further clinical deterioration. Furthermore, it is not anticipated that the patient will be medically stable for discharge from the hospital within 2 midnights of admission. The following factors support the patient status of inpatient.   " The patient's presenting symptoms include sepsis and findings concerning for the same. " The worrisome physical exam findings include fever chills hypotension. " The initial radiographic and laboratory data are worrisome because of findings as above. " The chronic co-morbidities include hep C.   * I certify that at the point of admission it is my clinical judgment that the patient will require inpatient hospital care spanning beyond 2 midnights from the point of admission due to high intensity  of service, high risk for further deterioration and high frequency of surveillance required.*    Lovenox full code inpatient no family  Time spent: 77 minutes  Jamesyn Moorefield, MD  [facility] Direct contact: [phone] --[employer] [place]  --[email or web address]; password [email or web address]  7PM-7AM contact night coverage as above  04/17/2018, 5:40 PM

## 2018-04-17 NOTE — Progress Notes (Signed)
A consult was received from an ED physician for vancomycin and cefepime per pharmacy dosing.  The patient's profile has been reviewed for ht/wt/allergies/indication/available labs.  NKDA.  A one time order has been placed for cefepime 2 g IV once and vancomycin 1500 mg IV once.  Further antibiotics/pharmacy consults should be ordered by admitting physician if indicated.                       Thank you, Lenis Noon, PharmD 04/17/2018  2:10 PM

## 2018-04-17 NOTE — Progress Notes (Signed)
Pharmacy Antibiotic Note  Laura [name] is a 48 y.o. female admitted on 04/17/2018 with sepsis.  Pharmacy has been consulted for vancomycin and cefepime dosing. NKDA.  Today, 04/17/18  WBC 18.2  SCr 1.01, CrCl ~70 mL/min  Lactate 2.4  Afebrile  Plan:  Cefepime 2 g IV q8h  Vancomycin 1500 mg LD in ED this evening. Initiate vancomycin 750 mg IV q12h maintenance dose for estimated AUC 440  Goal AUC 400-500  Tamiflu 75 mg PO BID x 5 days per MD  Metronidazole 500 mg IV q8h per MD  Follow renal function, culture data, and clinical course  Height: 5\' 8"  (172.7 cm) Weight: 165 lb (74.8 kg) IBW/kg (Calculated) : 63.9  Temp (24hrs), Avg:98.2 F (36.8 C), Min:97.7 F (36.5 C), Max:98.7 F (37.1 C)  Recent Labs  Lab 04/17/18 1327 04/17/18 1346  WBC  --  18.2*  CREATININE  --  1.01*  LATICACIDVEN 2.4*  --     Estimated Creatinine Clearance: 71 mL/min (A) (by C-G formula based on SCr of 1.01 mg/dL (H)).    No Known Allergies  Antimicrobials this admission: vancomycin 1/30 >>  cefepime 1/30 >>   Dose adjustments this admission:  Microbiology results: 1/30 BCx: Sent 1/30 UCx: Sent  1/30 respiratory panel: Sent  Thank you for allowing pharmacy to be a part of this patient's care.  Lenis Noon, PharmD 04/17/2018 6:23 PM

## 2018-04-17 NOTE — ED Notes (Signed)
Second IV attempted with no success. [place] will be attempted.

## 2018-04-17 NOTE — Telephone Encounter (Signed)
Noted. Patient is currently in the ED. 

## 2018-04-18 ENCOUNTER — Inpatient Hospital Stay (HOSPITAL_COMMUNITY): Payer: Medicare Other

## 2018-04-18 DIAGNOSIS — I361 Nonrheumatic tricuspid (valve) insufficiency: Secondary | ICD-10-CM

## 2018-04-18 LAB — ECHOCARDIOGRAM COMPLETE
Height: 68 in
Weight: 2761.92 oz

## 2018-04-18 LAB — CBC WITH DIFFERENTIAL/PLATELET
Abs Immature Granulocytes: 0.1 10*3/uL — ABNORMAL HIGH (ref 0.00–0.07)
Basophils Absolute: 0.1 10*3/uL (ref 0.0–0.1)
Basophils Relative: 0 %
EOS PCT: 2 %
Eosinophils Absolute: 0.3 10*3/uL (ref 0.0–0.5)
HCT: 33 % — ABNORMAL LOW (ref 36.0–46.0)
Hemoglobin: 10.5 g/dL — ABNORMAL LOW (ref 12.0–15.0)
Immature Granulocytes: 1 %
Lymphocytes Relative: 20 %
Lymphs Abs: 2.9 10*3/uL (ref 0.7–4.0)
MCH: 32 pg (ref 26.0–34.0)
MCHC: 31.8 g/dL (ref 30.0–36.0)
MCV: 100.6 fL — ABNORMAL HIGH (ref 80.0–100.0)
Monocytes Absolute: 0.5 10*3/uL (ref 0.1–1.0)
Monocytes Relative: 4 %
Neutro Abs: 10.9 10*3/uL — ABNORMAL HIGH (ref 1.7–7.7)
Neutrophils Relative %: 73 %
PLATELETS: 209 10*3/uL (ref 150–400)
RBC: 3.28 MIL/uL — ABNORMAL LOW (ref 3.87–5.11)
RDW: 12.9 % (ref 11.5–15.5)
WBC: 14.8 10*3/uL — ABNORMAL HIGH (ref 4.0–10.5)
nRBC: 0 % (ref 0.0–0.2)

## 2018-04-18 LAB — URINE CULTURE
Culture: NO GROWTH
Special Requests: NORMAL

## 2018-04-18 LAB — LACTIC ACID, PLASMA
Lactic Acid, Venous: 0.6 mmol/L (ref 0.5–1.9)
Lactic Acid, Venous: 0.6 mmol/L (ref 0.5–1.9)

## 2018-04-18 LAB — COMPREHENSIVE METABOLIC PANEL
ALT: 25 U/L (ref 0–44)
AST: 34 U/L (ref 15–41)
Albumin: 2.7 g/dL — ABNORMAL LOW (ref 3.5–5.0)
Alkaline Phosphatase: 62 U/L (ref 38–126)
Anion gap: 4 — ABNORMAL LOW (ref 5–15)
BUN: 11 mg/dL (ref 6–20)
CO2: 20 mmol/L — ABNORMAL LOW (ref 22–32)
Calcium: 7 mg/dL — ABNORMAL LOW (ref 8.9–10.3)
Chloride: 116 mmol/L — ABNORMAL HIGH (ref 98–111)
Creatinine, Ser: 0.56 mg/dL (ref 0.44–1.00)
GFR calc Af Amer: >60 mL/min (ref >60)
Glucose, Bld: 104 mg/dL — ABNORMAL HIGH (ref 70–99)
Potassium: 3.9 mmol/L (ref 3.5–5.1)
Sodium: 140 mmol/L (ref 135–145)
Total Bilirubin: 1 mg/dL (ref 0.3–1.2)
Total Protein: 5.3 g/dL — ABNORMAL LOW (ref 6.5–8.1)

## 2018-04-18 LAB — PROCALCITONIN: Procalcitonin: 0.35 ng/mL

## 2018-04-18 LAB — MAGNESIUM: Magnesium: 1.6 mg/dL — ABNORMAL LOW (ref 1.7–2.4)

## 2018-04-18 LAB — PHOSPHORUS: Phosphorus: 2.4 mg/dL — ABNORMAL LOW (ref 2.5–4.6)

## 2018-04-18 MED ORDER — MAGNESIUM OXIDE 400 (241.3 MG) MG PO TABS
800.0000 mg | ORAL_TABLET | Freq: Two times a day (BID) | ORAL | Status: DC
  Administered 2018-04-18 – 2018-04-20 (×6): 800 mg via ORAL
  Filled 2018-04-18 (×6): qty 2

## 2018-04-18 NOTE — Progress Notes (Signed)
  Echocardiogram 2D Echocardiogram has been performed.  Darlina Sicilian M 04/18/2018, 11:21 AM

## 2018-04-18 NOTE — Progress Notes (Signed)
[facility] PRO[facility]ESS NOTE  Laura [name] BDZ:[id number] DOB: 1970-06-02 DOA: 04/17/2018 PCP: Vivi Barrack, MD   Narrative: 48 year old Caucasian female previously from [place] moved here last year 2019 when her 77 year old son died from an accidental drowning Chronic hep C Prior back surgeries with opiate dependence and previously on methadone with 5 back surgeries in the past Hypertension Prior heavy ethanolism Free from heroin until 3 to 4 days prior to this hospitalization-has been snorting but recently injecting  Admit with sepsi unclear etiology  hypotensive to the 80 range also hypoxic in the 80 range-she was started on broad-spectrum antibiotics White count 18 BUN/creatinine elevated 23/1.0 glucose 69 AST 142 lactic acid 2.4 CT of head was done because of syncope no findings CT of the chest to rule out PE was done showed possible upper lobe infiltrate bilaterally   A & Plan Likely Aspiration PNA  Cont broad spectrum vanc/cefe/flagyll-lactic  improved hypotension better-trend WBC  Cont saline 125 cc./h ? Endocarditis  Await return of BC x 2--if neg would not pursue TEE  As TTe 1/31 was neg grossly for endocarditis Chr pain Is sleepy but claiming 7/10 pain--avoid Opiates given Substance abuse disorder and unlikeness to continue on d/c--on Mobic as OP Severe l4-l5 stenosis Approp fro OP NS follow-up HTN Clonidine from admit on hold Depression continue Lexapro 20 quetiapine 200 at bedtime trazodone 100 at bedtime Low mag Replace po 800 bid--recheck in am smoker  heparing No family  Inpatient pending resolution   Verlon Au, MD  [facility] Via [employer] app OR -[email or web address] 7PM-7AM contact night coverage as above 04/18/2018, 1:09 PM  LOS: 1 day   Consultants:  n  Procedures:  n  Antimicrobials:  As above  Interval history/Subjective: Awakens but barely Poor appetite Nursing reports c/o pain but falls aslee hasnt taken any sedating  agent  Objective:  Vitals:  Vitals:   04/18/18 0847 04/18/18 0850  BP:    Pulse:    Resp:    Temp:    SpO2: (!) 85% 92%    Exam:   alert CF in na dcta b Neck soft s1 s 2no m--tele benign No le edema abd soft nt nd no reboudn [facility] [place] and tremors have stopped   I have personally reviewed the following:  DATA   Labs:  Bun/creat down 23/1.01-->11/0.56  Sodium up 133-->140  Magnesium low 1.6  Lactic acid now 0.6 from clsoe to 3, PCT 0.35  Imaging studies: IMPRESSIONS    1. The left ventricle has normal systolic function of 34-19%. The cavity size is normal. There is no left ventricular wall thickness. Echo evidence of Indeterminate diastolic filling patterns.  2. Normal left atrial size.  3. Normal right atrial size.  4. Normal tricuspid valve.  5. Tricuspid regurgitation is mild.  6. The aortic root is normal is size and structure.  7. No atrial level shunt detected by color flow Doppler.   Scheduled Meds: . enoxaparin (LOVENOX) injection  40 mg Subcutaneous QHS  . escitalopram  20 mg Oral Daily  . hydrocortisone sod succinate (SOLU-CORTEF) inj  50 mg Intravenous Q6H  . magnesium oxide  800 mg Oral BID  . meloxicam  15 mg Oral Daily  . QUEtiapine  200 mg Oral QHS  . traZODone  100 mg Oral QHS   Continuous Infusions: . sodium chloride 125 mL/hr at 04/18/18 1127  . ceFEPime (MAXIPIME) IV Stopped (04/18/18 0615)  . metronidazole Stopped (04/18/18 1000)  . 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

## 2018-04-19 LAB — CBC WITH DIFFERENTIAL/PLATELET
Abs Immature Granulocytes: 0.1 10*3/uL — ABNORMAL HIGH (ref 0.00–0.07)
Basophils Absolute: 0 10*3/uL (ref 0.0–0.1)
Basophils Relative: 0 %
Eosinophils Absolute: 0 10*3/uL (ref 0.0–0.5)
Eosinophils Relative: 0 %
HCT: 32.6 % — ABNORMAL LOW (ref 36.0–46.0)
Hemoglobin: 10.5 g/dL — ABNORMAL LOW (ref 12.0–15.0)
IMMATURE GRANULOCYTES: 1 %
Lymphocytes Relative: 11 %
Lymphs Abs: 1.2 10*3/uL (ref 0.7–4.0)
MCH: 31.2 pg (ref 26.0–34.0)
MCHC: 32.2 g/dL (ref 30.0–36.0)
MCV: 96.7 fL (ref 80.0–100.0)
Monocytes Absolute: 0.4 10*3/uL (ref 0.1–1.0)
Monocytes Relative: 4 %
NEUTROS PCT: 84 %
NRBC: 0 % (ref 0.0–0.2)
Neutro Abs: 9.4 10*3/uL — ABNORMAL HIGH (ref 1.7–7.7)
Platelets: 228 10*3/uL (ref 150–400)
RBC: 3.37 MIL/uL — ABNORMAL LOW (ref 3.87–5.11)
RDW: 12.3 % (ref 11.5–15.5)
WBC: 11.2 10*3/uL — ABNORMAL HIGH (ref 4.0–10.5)

## 2018-04-19 LAB — COMPREHENSIVE METABOLIC PANEL
ALT: 25 U/L (ref 0–44)
AST: 36 U/L (ref 15–41)
Albumin: 2.8 g/dL — ABNORMAL LOW (ref 3.5–5.0)
Alkaline Phosphatase: 59 U/L (ref 38–126)
Anion gap: 6 (ref 5–15)
BILIRUBIN TOTAL: 1.3 mg/dL — AB (ref 0.3–1.2)
BUN: 10 mg/dL (ref 6–20)
CO2: 22 mmol/L (ref 22–32)
Calcium: 7.6 mg/dL — ABNORMAL LOW (ref 8.9–10.3)
Chloride: 110 mmol/L (ref 98–111)
Creatinine, Ser: 0.44 mg/dL (ref 0.44–1.00)
GFR calc Af Amer: >60 mL/min (ref >60)
GFR calc non Af Amer: >60 mL/min (ref >60)
Glucose, Bld: 118 mg/dL — ABNORMAL HIGH (ref 70–99)
Potassium: 3.6 mmol/L (ref 3.5–5.1)
Sodium: 138 mmol/L (ref 135–145)
Total Protein: 5.9 g/dL — ABNORMAL LOW (ref 6.5–8.1)

## 2018-04-19 LAB — GROUP A STREP BY PCR: Group A Strep by PCR: NOT DETECTED

## 2018-04-19 MED ORDER — IPRATROPIUM-ALBUTEROL 0.5-2.5 (3) MG/3ML IN SOLN
3.0000 mL | Freq: Once | RESPIRATORY_TRACT | Status: AC
  Administered 2018-04-20: 3 mL via RESPIRATORY_TRACT
  Filled 2018-04-19: qty 3

## 2018-04-19 MED ORDER — HYDROCORTISONE NA SUCCINATE PF 100 MG IJ SOLR
50.0000 mg | Freq: Two times a day (BID) | INTRAMUSCULAR | Status: DC
  Administered 2018-04-19 – 2018-04-20 (×2): 50 mg via INTRAVENOUS
  Filled 2018-04-19 (×2): qty 2

## 2018-04-19 NOTE — Progress Notes (Signed)
[facility] PROGRESS NOTE  Laura [name] SEG:[id number] DOB: Feb 09, 1971 DOA: 04/17/2018 PCP: Vivi Barrack, MD   Narrative: 48 year old Caucasian female previously from [place] moved here last year 2019 when her 55 year old son died from an accidental drowning Chronic hep C Prior back surgeries with opiate dependence and previously on methadone with 5 back surgeries in the past Hypertension Prior heavy ethanolism Free from heroin until 3 to 4 days prior to this hospitalization-has been snorting but recently injecting  Admit with sepsi unclear etiology  hypotensive to the 80 range also hypoxic in the 80 range-she was started on broad-spectrum antibiotics White count 18 BUN/creatinine elevated 23/1.0 glucose 69 AST 142 lactic acid 2.4 CT of head was done because of syncope no findings CT of the chest to rule out PE was done showed possible upper lobe infiltrate bilaterally   A & Plan Likely Aspiration PNA versus strep throat  Cont broad spectrum vanc/cefe/flagyll-lactic  improved hypotension better-trend WBC We will give her lozenges she is still sleepy and looks ill and we will monitor her with continued IV antibiotics but a low rate of saline 125 cc./h-->50 Will consider narrowing antibiotics based on cultures Cut back stress dose steroids to 50 every 12 ? Endocarditis  Await return of BC x 2--if neg would not pursue TEE  As TTe 1/31 was neg grossly for endocarditis essentially ruled out Chr pain Is sleepy but claiming 7/10 pain--avoid Opiates given Substance abuse disorder and unlikeness to continue on d/c--on Mobic as OP Severe l4-l5 stenosis Approp fro OP NS follow-up HTN Clonidine from admit on hold Depression continue Lexapro 20 quetiapine 200 at bedtime trazodone 100 at bedtime Low mag Replace po 800 bid--recheck tomorrow a.m. smoker  heparing No family  Inpatient pending resolution   Verlon Au, MD  [facility] Via [employer] app OR  [email or web address] 7PM-7AM contact night coverage as above 04/19/2018, 10:36 AM  LOS: 2 days   Consultants:  n  Procedures:  n  Antimicrobials:  As above  Interval history/Subjective:  Sleepy very sore throat does not feel well still not eating drinking has been out of bed but still listless Tells me that her voice is changed to Levaquin  Objective:  Vitals:  Vitals:   04/19/18 0603 04/19/18 1010  BP: 121/79 116/78  Pulse: 98 81  Resp: 18 (!) 28  Temp: 100.2 F (37.9 C) 98.6 F (37 C)  SpO2: 90%     Exam:  Alert pleasant but tired appearing some left submandibular lymphadenopathy tonsillar areas on the left side seems swollen and exudative Chest is clear without added sound s1 s 2no m--tele benign No le edema   I have personally reviewed the following:  DATA   Labs:  Bun/creat down 23/1.01-->11/0.56-->10/0.4  Sodium up 133-->140--138  Magnesium checking in a.m. WBC down to 11 from 14  Lactic acid now 0.6 from clsoe to 3, PCT 0.35  Imaging studies: IMPRESSIONS    1. The left ventricle has normal systolic function of 73-71%. The cavity size is normal. There is no left ventricular wall thickness. Echo evidence of Indeterminate diastolic filling patterns.  2. Normal left atrial size.  3. Normal right atrial size.  4. Normal tricuspid valve.  5. Tricuspid regurgitation is mild.  6. The aortic root is normal is size and structure.  7. No atrial level shunt detected by color flow Doppler.   Scheduled Meds: . enoxaparin (LOVENOX) injection  40 mg Subcutaneous QHS  . escitalopram  20 mg Oral Daily  . hydrocortisone  sod succinate (SOLU-CORTEF) inj  50 mg Intravenous Q6H  . magnesium oxide  800 mg Oral BID  . meloxicam  15 mg Oral Daily  . QUEtiapine  200 mg Oral QHS  . traZODone  100 mg Oral QHS   Continuous Infusions: . sodium chloride 50 mL/hr at 04/19/18 1009  . ceFEPime (MAXIPIME) IV Stopped (04/19/18 8441)  . metronidazole 500 mg (04/19/18  0815)  . 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

## 2018-04-20 LAB — CBC WITH DIFFERENTIAL/PLATELET
Abs Immature Granulocytes: 0.06 10*3/uL (ref 0.00–0.07)
Basophils Absolute: 0 10*3/uL (ref 0.0–0.1)
Basophils Relative: 0 %
Eosinophils Absolute: 0.2 10*3/uL (ref 0.0–0.5)
Eosinophils Relative: 2 %
HCT: 31 % — ABNORMAL LOW (ref 36.0–46.0)
Hemoglobin: 10.3 g/dL — ABNORMAL LOW (ref 12.0–15.0)
Immature Granulocytes: 1 %
LYMPHS ABS: 2.6 10*3/uL (ref 0.7–4.0)
Lymphocytes Relative: 22 %
MCH: 31 pg (ref 26.0–34.0)
MCHC: 33.2 g/dL (ref 30.0–36.0)
MCV: 93.4 fL (ref 80.0–100.0)
Monocytes Absolute: 0.7 10*3/uL (ref 0.1–1.0)
Monocytes Relative: 6 %
Neutro Abs: 8 10*3/uL — ABNORMAL HIGH (ref 1.7–7.7)
Neutrophils Relative %: 69 %
Platelets: 265 10*3/uL (ref 150–400)
RBC: 3.32 MIL/uL — ABNORMAL LOW (ref 3.87–5.11)
RDW: 12.1 % (ref 11.5–15.5)
WBC: 11.6 10*3/uL — ABNORMAL HIGH (ref 4.0–10.5)
nRBC: 0 % (ref 0.0–0.2)

## 2018-04-20 LAB — RENAL FUNCTION PANEL
Albumin: 2.8 g/dL — ABNORMAL LOW (ref 3.5–5.0)
Anion gap: 6 (ref 5–15)
BUN: 13 mg/dL (ref 6–20)
CO2: 23 mmol/L (ref 22–32)
Calcium: 7.8 mg/dL — ABNORMAL LOW (ref 8.9–10.3)
Chloride: 110 mmol/L (ref 98–111)
Creatinine, Ser: 0.48 mg/dL (ref 0.44–1.00)
GFR calc Af Amer: >60 mL/min (ref >60)
GFR calc non Af Amer: >60 mL/min (ref >60)
Glucose, Bld: 107 mg/dL — ABNORMAL HIGH (ref 70–99)
Phosphorus: 1.3 mg/dL — ABNORMAL LOW (ref 2.5–4.6)
Potassium: 2.9 mmol/L — ABNORMAL LOW (ref 3.5–5.1)
Sodium: 139 mmol/L (ref 135–145)

## 2018-04-20 MED ORDER — IPRATROPIUM-ALBUTEROL 0.5-2.5 (3) MG/3ML IN SOLN
3.0000 mL | Freq: Once | RESPIRATORY_TRACT | Status: AC
  Administered 2018-04-20: 3 mL via RESPIRATORY_TRACT
  Filled 2018-04-20: qty 3

## 2018-04-20 MED ORDER — PHENOL 1.4 % MT LIQD
1.0000 | OROMUCOSAL | Status: DC | PRN
  Filled 2018-04-20: qty 177

## 2018-04-20 MED ORDER — POTASSIUM CHLORIDE CRYS ER 20 MEQ PO TBCR
40.0000 meq | EXTENDED_RELEASE_TABLET | Freq: Two times a day (BID) | ORAL | Status: DC
  Administered 2018-04-20 (×2): 40 meq via ORAL
  Filled 2018-04-20 (×2): qty 2

## 2018-04-20 MED ORDER — SODIUM CHLORIDE 0.9 % IV SOLN
1.0000 g | Freq: Three times a day (TID) | INTRAVENOUS | Status: DC
  Filled 2018-04-20 (×2): qty 1

## 2018-04-20 NOTE — Progress Notes (Signed)
[facility] PROGRESS NOTE  Laura [name] WJX:[id number] DOB: 08/18/1970 DOA: 04/17/2018 PCP: Vivi Barrack, MD   Narrative: 48 year old Caucasian female previously from [place] moved here last year 2019 when her 77 year old son died from an accidental drowning Chronic hep C Prior back surgeries with opiate dependence and previously on methadone with 5 back surgeries in the past Hypertension Prior heavy ethanolism Free from heroin until 3 to 4 days prior to this hospitalization-has been snorting but recently injecting  Admit with sepsi unclear etiology  hypotensive to the 80 range also hypoxic in the 80 range-she was started on broad-spectrum antibiotics White count 18 BUN/creatinine elevated 23/1.0 glucose 69 AST 142 lactic acid 2.4 CT of head was done because of syncope no findings CT of the chest to rule out PE was done showed possible upper lobe infiltrate bilaterally ECHO was neg   A & Plan Likely Aspiration PNA versus strep throat No clear focal source D/w ID Dr. Bennie [place] d/c all antibiotics and just observe currently If spikes fever re-culture ? Endocarditis  Low probability given - BC--no further work-up Chr pain Continue non opiate meds Severe l4-l5 stenosis Approp fro OP NS follow-up HTN Clonidine from admit on hold 2/2 hypotension Resume after seeing tredns prior to d/c Depression continue Lexapro 20 quetiapine 200 at bedtime trazodone 100 at bedtime Low mag/Hypokalemia Replace mag po 800 bid--recheck K and mag am smoker  heparing No family  Inpatient pending resolution   Verlon Au, MD  [facility] Via [employer] app OR -[email or web address] 7PM-7AM contact night coverage as above 04/20/2018, 10:59 AM  LOS: 3 days   Consultants:  n  Procedures:  n  Antimicrobials:  As above  Interval history/Subjective:  Much better sore throat better no fever but low grades noted No cp No diarr No cough   Objective:  Vitals:  Vitals:   04/20/18 1022 04/20/18 1023  BP: (!) 134/99   Pulse: 80   Resp: (!) 22   Temp: 97.6 F (36.4 C)   SpO2:  92%    Exam:  Alert pleasant-less swelling to L tonsil cta b no added sound abd soft nt n no rebound Neuro intact No le edema No rash   I have personally reviewed the following:  DATA   Labs:  Bun/creat down 23/1.01-->11/0.56-->10/0.4-->13/0.4  K 2.9  Sodium up 133-->140--138  Magnesium checking in a.m.   WBC down to 11.6 from 14   Imaging studies: IMPRESSIONS    1. The left ventricle has normal systolic function of 21-30%. The cavity size is normal. There is no left ventricular wall thickness. Echo evidence of Indeterminate diastolic filling patterns.  2. Normal left atrial size.  3. Normal right atrial size.  4. Normal tricuspid valve.  5. Tricuspid regurgitation is mild.  6. The aortic root is normal is size and structure.  7. No atrial level shunt detected by color flow Doppler.   Scheduled Meds: . enoxaparin (LOVENOX) injection  40 mg Subcutaneous QHS  . escitalopram  20 mg Oral Daily  . hydrocortisone sod succinate (SOLU-CORTEF) inj  50 mg Intravenous Q12H  . magnesium oxide  800 mg Oral BID  . meloxicam  15 mg Oral Daily  . potassium chloride  40 mEq Oral BID  . QUEtiapine  200 mg Oral QHS  . traZODone  100 mg Oral QHS   Continuous Infusions: . sodium chloride 50 mL/hr at 04/19/18 1735  . ceFEPime (MAXIPIME) IV 2 g (04/20/18 0452)  . metronidazole 500 mg (04/20/18 0845)  .  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

## 2018-04-20 NOTE — Progress Notes (Signed)
Pharmacy Antibiotic Note  Laura [name] is a 48 y.o. female hx heroin use presented to the ED on 04/17/2018 with c/o syncope.  In the ED, she was found to be hypotensive and hypoxic.  Broad abx with cefepime, vancomycin and flagyl started on admission for sepsis.  TTE on 1/31 was negative for vegetation.  Today, 04/20/2018: - day #3 abx - Tmax 100.3, wbc wnl - scr stable (crcl~66) - all cultures have been negative thus far  Plan: - continue vancomycin 750 mg IV q12h. If to continue with vancomycin for suspected PNA, consider checking MRSA PCR - adjust cefepime to 1gm IV q8h - flagyl 500 mg IV q8h per MD - f/u with plan for abx ______________________________________________  Height: 5\' 8"  (172.7 cm) Weight: 103 lb 8 oz (46.9 kg) IBW/kg (Calculated) : 63.9  Temp (24hrs), Avg:99.3 F (37.4 C), Min:97.6 F (36.4 C), Max:100.3 F (37.9 C)  Recent Labs  Lab 04/17/18 1327 04/17/18 1346 04/17/18 1918 04/17/18 2229 04/18/18 0218 04/18/18 0525 04/19/18 0409 04/20/18 0449  WBC  --  18.2*  --  16.5* 14.8*  --  11.2* 11.6*  CREATININE  --  1.01*  --  0.65 0.56  --  0.44 0.48  LATICACIDVEN 2.4*  --  1.8 1.0 0.6 0.6  --   --     Estimated Creatinine Clearance: 65.7 mL/min (by C-G formula based on SCr of 0.48 mg/dL).    No Known Allergies  Antimicrobials this admission: 1/30 tamiflu>> 1/31 1/30 vanc >> 1/30 cefepime >>  1/30 metro >>   Microbiology results: 1/30 BCx x2: 1/30 UCx: neg FINAL 1/30 resp panel pcr: neg 2/1 group A strep: 2/1 group A strep PCR: neg  Thank you for allowing pharmacy to be a part of this patient's care.  Lynelle Doctor 04/20/2018 10:51 AM

## 2018-04-21 LAB — CBC WITH DIFFERENTIAL/PLATELET
Abs Immature Granulocytes: 0.08 10*3/uL — ABNORMAL HIGH (ref 0.00–0.07)
Basophils Absolute: 0.1 10*3/uL (ref 0.0–0.1)
Basophils Relative: 1 %
Eosinophils Absolute: 0.9 10*3/uL — ABNORMAL HIGH (ref 0.0–0.5)
Eosinophils Relative: 8 %
HCT: 30.9 % — ABNORMAL LOW (ref 36.0–46.0)
Hemoglobin: 10.2 g/dL — ABNORMAL LOW (ref 12.0–15.0)
IMMATURE GRANULOCYTES: 1 %
Lymphocytes Relative: 30 %
Lymphs Abs: 3.1 10*3/uL (ref 0.7–4.0)
MCH: 31.5 pg (ref 26.0–34.0)
MCHC: 33 g/dL (ref 30.0–36.0)
MCV: 95.4 fL (ref 80.0–100.0)
Monocytes Absolute: 0.8 10*3/uL (ref 0.1–1.0)
Monocytes Relative: 8 %
NEUTROS ABS: 5.3 10*3/uL (ref 1.7–7.7)
Neutrophils Relative %: 52 %
Platelets: 236 10*3/uL (ref 150–400)
RBC: 3.24 MIL/uL — ABNORMAL LOW (ref 3.87–5.11)
RDW: 12.2 % (ref 11.5–15.5)
WBC: 10.2 10*3/uL (ref 4.0–10.5)
nRBC: 0 % (ref 0.0–0.2)

## 2018-04-21 LAB — BASIC METABOLIC PANEL
Anion gap: 5 (ref 5–15)
BUN: 16 mg/dL (ref 6–20)
CO2: 22 mmol/L (ref 22–32)
CREATININE: 0.53 mg/dL (ref 0.44–1.00)
Calcium: 7.5 mg/dL — ABNORMAL LOW (ref 8.9–10.3)
Chloride: 112 mmol/L — ABNORMAL HIGH (ref 98–111)
GFR calc Af Amer: >60 mL/min (ref >60)
GFR calc non Af Amer: >60 mL/min (ref >60)
Glucose, Bld: 86 mg/dL (ref 70–99)
Potassium: 3.6 mmol/L (ref 3.5–5.1)
Sodium: 139 mmol/L (ref 135–145)

## 2018-04-21 LAB — MAGNESIUM: Magnesium: 2 mg/dL (ref 1.7–2.4)

## 2018-04-21 MED ORDER — PHENOL 1.4 % MT LIQD: 1.0000 | OROMUCOSAL | 0 refills | Status: DC | PRN

## 2018-04-21 MED ORDER — POTASSIUM CHLORIDE CRYS ER 20 MEQ PO TBCR: 40.0000 meq | EXTENDED_RELEASE_TABLET | Freq: Two times a day (BID) | ORAL | 0 refills | Status: DC

## 2018-04-21 MED ORDER — SPACER/AERO-HOLD CHAMBER BAGS MISC: 1.0000 | 1 refills | Status: DC | PRN

## 2018-04-21 MED ORDER — ALBUTEROL SULFATE HFA 108 (90 BASE) MCG/ACT IN AERS: 2.0000 | INHALATION_SPRAY | Freq: Four times a day (QID) | RESPIRATORY_TRACT | 2 refills | Status: DC | PRN

## 2018-04-21 NOTE — Discharge Summary (Signed)
Physician Discharge Summary  Laura [name] ZYS:[id number] DOB: 10-11-1970 DOA: 04/17/2018  PCP: Vivi Barrack, MD  Admit date: 04/17/2018 Discharge date: 04/21/2018  Time spent: 35 minutes  Recommendations for Outpatient Follow-up:  1. Patient requires outpatient counseling for substance abuse please refer you see fit 2. Will need potassium and magnesium checked outpatient setting 3 to 4 days 3. New prescriptions this admission her potassium, albuterol, spacer 4. 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 ([place])   Bacteremia   Discharge Condition: Improved  Diet recommendation: Heart healthy  Filed Weights   04/17/18 1302 04/17/18 2214 04/19/18 0700  Weight: 74.8 kg 78.3 kg 46.9 kg    History of present illness:  48 year old Caucasian female previously from [place] moved here last year 2019 when her 48 year old son died from an accidental drowning Chronic hep C Prior back surgeries with opiate dependence and previously on methadone with 5 back surgeries in the past Hypertension Prior heavy ethanolism Free from heroin until 3 to 4 days prior to this hospitalization-has been snorting but recently injecting  Admit with sepsi unclear etiology  hypotensive to the 80 range also hypoxic in the 80 range-she was started on broad-spectrum antibiotics White count 18 BUN/creatinine elevated 23/1.0 glucose 69 AST 142 lactic acid 2.4 CT of head was  done because of syncope no findings CT of the chest to rule out PE was done showed possible upper lobe infiltrate bilaterally ECHO was neg   Hospital Course:  Likely Aspiration PNA versus strep throat No clear focal source D/w ID Dr. Bennie Dallas d/c all antibiotics and just observe currently Did not develop any further fevers over the course of time in the hospital-echo was done which did not show infection she had a sore throat but this is probably a viral source and she did well off of antibiotics ? Endocarditis             Low probability given - BC--no further work-up-echo is negative Chr pain Continue non opiate meds Severe l4-l5 stenosis Approp fro OP NS follow-up HTN Clonidine from admit on hold 2/2 hypotension Resumed on discharge Depression continue Lexapro 20 quetiapine 200 at bedtime trazodone 100 at bedtime Low mag/Hypokalemia Replace mag po 800 bid--potassium is 3.6 on discharge magnesium was normal in the 2 range smoker   Procedures: IMPRESSIONS   1. The left ventricle has normal systolic function of 93-23%. The cavity size is normal. There is no left ventricular wall thickness. Echo evidence of Indeterminate diastolic filling patterns. 2. Normal left atrial size. 3. Normal right atrial size. 4. Normal tricuspid valve. 5. Tricuspid regurgitation is mild. 6. The aortic root is normal is size and structure.  7. No atrial level shunt detected by color flow Doppler. (i.e. Studies not automatically included, echos, thoracentesis, etc; not x-rays)  Consultations:   telephone consulted ID Dr. Linus Salmons  Discharge Exam: Vitals:   04/20/18 2346 04/21/18 0641  BP:  (!) 136/91  Pulse:  66  Resp:    Temp:  42 F (36.7 C)  SpO2: 96% 97%    General: Awake alert pleasant no distress no icterus no pallor Cardiovascular: S1-S2 no murmur rub or gallop Respiratory: Clinically clear no rales no rhonchi Throat is clear Abdomen is soft no rebound no  guarding No lower extremity edema No rash  Discharge Instructions   Discharge Instructions    Diet - low sodium heart healthy   Complete by:  As directed    Discharge instructions   Complete by:  As directed    You were admitted to the hospital with a probable viral infection as all of your testing was negative You do not need oxygen but we have prescribed for you an inhaler Please take your medications as you regularly do in the outpatient setting please follow-up with your primary physician for management of your other chronic underlying illnesses Best of luck   Increase activity slowly   Complete by:  As directed      Allergies as of 04/21/2018   No Known Allergies     Medication List    TAKE these medications   albuterol 108 (90 Base) MCG/ACT inhaler Commonly known as:  PROVENTIL HFA;VENTOLIN HFA Inhale 2 puffs into the lungs every 6 (six) hours as needed for wheezing or shortness of breath.   cloNIDine 0.2 MG tablet Commonly known as:  CATAPRES Take 0.2 mg by mouth 3 (three) times daily. What changed:  Another medication with the same name was removed. Continue taking this medication, and follow the directions you see here.   escitalopram 20 MG tablet Commonly known as:  LEXAPRO Take 20 mg by mouth daily.   meloxicam 15 MG tablet Commonly known as:  MOBIC Take 1 tablet (15 mg total) by mouth daily.   phenol 1.4 % Liqd Commonly known as:  CHLORASEPTIC Use as directed 1 spray in the mouth or throat as needed for throat irritation / pain.   potassium chloride SA 20 MEQ tablet Commonly known as:  K-DUR,KLOR-CON Take 2 tablets (40 mEq total) by mouth 2 (two) times daily.   QUEtiapine 400 MG tablet Commonly known as:  SEROQUEL Take 1 tablet (400 mg total) by mouth at bedtime.   QUEtiapine 200 MG tablet Commonly known as:  SEROQUEL Take 200 mg by mouth at bedtime.   Spacer/Aero-Hold Chamber Bags Misc 1 each by Does not apply route as needed.   tiZANidine 4  MG tablet Commonly known as:  ZANAFLEX Take 1 tablet (4 mg total) by mouth every 6 (six) hours as needed for muscle spasms.   traZODone 100 MG tablet Commonly known as:  DESYREL Take 1 tablet (100 mg total) by mouth at bedtime as needed for sleep. For sleep What changed:  when to take this      No Known Allergies    The results of significant diagnostics from this hospitalization (including imaging, microbiology, ancillary and laboratory) are listed below for reference.    Significant Diagnostic Studies: Ct Head Wo Contrast  Result Date: 04/17/2018 CLINICAL DATA:  Recent seizure activity EXAM: CT HEAD WITHOUT CONTRAST TECHNIQUE: Contiguous axial images were obtained from the base of the skull through the vertex without intravenous contrast. COMPARISON:  None FINDINGS: Brain: No evidence of acute infarction, hemorrhage, hydrocephalus, extra-axial collection or mass lesion/mass effect. Vascular: No hyperdense vessel or unexpected calcification. Skull: Normal. Negative for fracture or focal lesion. Sinuses/Orbits: No acute finding. Other: Calcified subcutaneous nodule is noted to the right of the midline in frontal scalp. IMPRESSION: No acute intracranial abnormality  noted. Electronically Signed   By: Inez Catalina M.D.   On: 04/17/2018 17:08   Ct Angio Chest Pe W And/or Wo Contrast  Result Date: 04/17/2018 CLINICAL DATA:  Shortness of breath and fatigue EXAM: CT ANGIOGRAPHY CHEST WITH CONTRAST TECHNIQUE: Multidetector CT imaging of the chest was performed using the standard protocol during bolus administration of intravenous contrast. Multiplanar CT image reconstructions and MIPs were obtained to evaluate the vascular anatomy. CONTRAST:  172mL [id number] COMPARISON:  Plain film from earlier in the same day. FINDINGS: Cardiovascular: Thoracic aorta is within normal limits. The pulmonary artery shows a normal branching pattern without intraluminal filling defect to suggest pulmonary embolism. No  pericardial effusion is seen. No cardiac enlargement is noted. No significant coronary calcifications are seen. Mediastinum/Nodes: Thoracic inlet is within normal limits. Scattered small hilar lymph nodes are noted bilaterally likely reactive in nature given the changes in lungs bilaterally. The esophagus is within normal limits. Lungs/Pleura: Lungs are well aerated bilaterally without focal effusion. Patchy infiltrates are noted throughout both lungs primarily in the upper lobes. No sizable parenchymal nodules are noted. Upper Abdomen: Visualized upper abdomen is within normal limits. Musculoskeletal: Degenerative changes of the thoracic spine are noted. No acute bony abnormality is seen. Review of the MIP images confirms the above findings. IMPRESSION: No evidence of pulmonary emboli. Diffuse primarily upper lobe infiltrate bilaterally. Reactive hilar and mediastinal lymph nodes are noted. Electronically Signed   By: Inez Catalina M.D.   On: 04/17/2018 17:07   Mr Lumbar Spine Wo Contrast  Result Date: 04/04/2018 CLINICAL DATA:  Syncope. Sudden back pain with lower extremity weakness. EXAM: MRI LUMBAR SPINE WITHOUT CONTRAST TECHNIQUE: Multiplanar, multisequence MR imaging of the lumbar spine was performed. No intravenous contrast was administered. COMPARISON:  None. FINDINGS: Segmentation: There are 5 lumbar vertebral bodies. There is an incomplete disc space at L4-L5. The lowest disc space is L5-S1. Alignment:  There is levoscoliosis with apex at L4. Vertebrae: No acute compression fracture, discitis-osteomyelitis of focal marrow lesion. Conus medullaris and cauda equina: The conus medullaris terminates at the L2 level. The cauda equina and conus medullaris are both normal. Paraspinal and other soft tissues: The visualized retroperitoneal organs and paraspinal soft tissues are normal. Disc levels: Sagittal plane imaging includes the T11-12 disc level through the upper sacrum, with axial imaging of the T12-L1 to  L5-S1 disc levels. T11-L1: Normal. L1-2: Disc space narrowing with small bulge. No spinal canal or neural foraminal stenosis. L2-3: Normal disc space. Mild left facet hypertrophy. No spinal canal or neural foraminal stenosis. L3-4: Left eccentric disc bulge with mild bilateral facet hypertrophy. Mild spinal canal stenosis and narrowing of the lateral recesses. Mild right and moderate left neural foraminal stenosis. L4-5: Incomplete disc space. Right eccentric disc osteophyte complex with severe narrowing of the right neural foramen. No central spinal canal or left neural foraminal stenosis. Moderate right facet hypertrophy. L5-S1: Disc space narrowing with mild bulge. Mild bilateral neural foraminal stenosis. The visualized portion of the sacrum is normal. IMPRESSION: 1. No acute abnormality of the lumbar spine 2. 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 3. L3-4 mild spinal canal stenosis with mild right, moderate left neural foraminal stenosis. 4. Mild bilateral L5-S1 neural foraminal stenosis. Electronically Signed   By: Ulyses Jarred M.D.   On: 04/04/2018 02:15   Dg Chest Portable 1 View  Result Date: 04/17/2018 CLINICAL DATA:  Shortness of breath. EXAM: PORTABLE CHEST 1 VIEW COMPARISON:  Radiograph of January 07, 2017. FINDINGS: Cardiomediastinal silhouette appears normal. Bilateral  perihilar airspace opacities are noted concerning for possible pneumonia or edema. No pneumothorax or pleural effusion is noted. Bony thorax is unremarkable. IMPRESSION: Mild bilateral perihilar opacities are noted concerning for edema or possibly pneumonia. Follow-up radiographs are recommended to ensure resolution and rule out neoplasm or malignancy. Electronically Signed   By: Marijo Conception, M.D.   On: 04/17/2018 15:16    Microbiology: Recent Results (from the past 240 hour(s))  Blood Culture (routine x 2)     Status: None (Preliminary result)   Collection Time: 04/17/18  2:08 PM  Result Value Ref Range Status    Specimen Description   Final    BLOOD LEFT HAND Performed at [facility], [place] [address], [place], [place] [address]    Special Requests   Final    BOTTLES DRAWN AEROBIC AND ANAEROBIC Blood Culture results may not be optimal due to an inadequate volume of blood received in culture bottles Performed at [facility] [address], [place], [place] [address]    Culture   Final    NO GROWTH 4 DAYS Performed at [facility], [place] [address], [place], [place] [address]    Report Status PENDING  Incomplete  Blood Culture (routine x 2)     Status: None (Preliminary result)   Collection Time: 04/17/18  2:24 PM  Result Value Ref Range Status   Specimen Description   Final    BLOOD RIGHT ANTECUBITAL Performed at [facility] [address] [place], [place] [address]    Special Requests   Final    BOTTLES DRAWN AEROBIC AND ANAEROBIC Blood Culture adequate volume Performed at [facility] [address], [place], [place] [address]    Culture   Final    NO GROWTH 4 DAYS Performed at [facility], [place] [address], [place], [place] [address]    Report Status PENDING  Incomplete  Urine culture     Status: None   Collection Time: 04/17/18  4:15 PM  Result Value Ref Range Status   Specimen Description URINE, CLEAN CATCH  Final   Special Requests   Final    Normal Performed at [facility] [address], [place], [place] [address]    Culture NO GROWTH  Final   Report Status 04/18/2018 FINAL  Final  Respiratory Panel by PCR     Status: None   Collection Time: 04/17/18  6:49 PM  Result Value Ref Range Status   Adenovirus NOT DETECTED NOT DETECTED Final   Coronavirus 229E NOT DETECTED NOT DETECTED Final   Coronavirus HKU1 NOT DETECTED NOT DETECTED Final   Coronavirus NL63 NOT DETECTED NOT DETECTED Final   Coronavirus OC43 NOT DETECTED NOT DETECTED Final   Metapneumovirus NOT  DETECTED NOT DETECTED Final   Rhinovirus / Enterovirus NOT DETECTED NOT DETECTED Final   Influenza A NOT DETECTED NOT DETECTED Final   Influenza B NOT DETECTED NOT DETECTED Final   Parainfluenza Virus 1 NOT DETECTED NOT DETECTED Final   Parainfluenza Virus 2 NOT DETECTED NOT DETECTED Final   Parainfluenza Virus 3 NOT DETECTED NOT DETECTED Final   Parainfluenza Virus 4 NOT DETECTED NOT DETECTED Final   Respiratory Syncytial Virus NOT DETECTED NOT DETECTED Final   Bordetella pertussis NOT DETECTED NOT DETECTED Final   Chlamydophila pneumoniae NOT DETECTED NOT DETECTED Final   Mycoplasma pneumoniae NOT DETECTED NOT DETECTED Final    Comment: Performed at [facility], [place]  [address], [place], [place] [address]  Group A Strep by PCR     Status: None   Collection Time: 04/19/18  2:00 PM  Result Value Ref Range Status   Group A Strep by PCR NOT DETECTED NOT DETECTED Final    Comment: Performed at [facility], [place] [address], [place], [place] [address]  Culture, group A strep     Status: None (Preliminary result)   Collection Time: 04/19/18  2:59 PM  Result Value Ref Range Status   Specimen Description   Final    THROAT Performed at [facility] [address], [place], [place] [address]    Special Requests   Final    NONE Performed at [facility], [place] [address] [place], [place] [address]    Culture   Final    TOO YOUNG TO READ Performed at [facility], [place] [address] [place], [place] [address]    Report Status PENDING  Incomplete     Labs: Basic Metabolic Panel: Recent Labs  Lab 04/17/18 1346 04/17/18 2229 04/18/18 0218 04/19/18 0409 04/20/18 0449 04/21/18 0456  NA 133*  --  140 138 139 139  K 4.0  --  3.9 3.6 2.9* 3.6  CL 101  --  116* 110 110 112*  CO2 23  --  20* 22 23 22   GLUCOSE 169*  --  104* 118* 107* 86  BUN 23*  --  11 10 13 16   CREATININE 1.01* 0.65 0.56 0.44 0.48 0.53  CALCIUM 8.8*   --  7.0* 7.6* 7.8* 7.5*  MG  --   --  1.6*  --   --  2.0  PHOS  --   --  2.4*  --  1.3*  --    Liver Function Tests: Recent Labs  Lab 04/17/18 1346 04/18/18 0218 04/19/18 0409 04/20/18 0449  AST 42* 34 36  --   ALT 34 25 25  --   ALKPHOS 90 62 59  --   BILITOT 1.0 1.0 1.3*  --   PROT 7.1 5.3* 5.9*  --   ALBUMIN 3.8 2.7* 2.8* 2.8*   No results for input(s): LIPASE, AMYLASE in the last 168 hours. No results for input(s): AMMONIA in the last 168 hours. CBC: Recent Labs  Lab 04/17/18 1346 04/17/18 2229 04/18/18 0218 04/19/18 0409 04/20/18 0449 04/21/18 0456  WBC 18.2* 16.5* 14.8* 11.2* 11.6* 10.2  NEUTROABS 14.0*  --  10.9* 9.4* 8.0* 5.3  HGB 11.9* 12.0 10.5* 10.5* 10.3* 10.2*  HCT 37.7 37.8 33.0* 32.6* 31.0* 30.9*  MCV 98.4 100.0 100.6* 96.7 93.4 95.4  PLT 280 232 209 228 265 236   Cardiac Enzymes: Recent Labs  Lab 04/17/18 1553  TROPONINI <0.03   BNP: BNP (last 3 results) Recent Labs    04/17/18 1346  BNP 62.9    ProBNP (last 3 results) No results for input(s): PROBNP in the last 8760 hours.  CBG: Recent Labs  Lab 04/17/18 1354  GLUCAP 184*       Signed:  Nita Sells MD   [facility] 04/21/2018, 8:38 AM

## 2018-04-22 ENCOUNTER — Telehealth: Payer: Self-pay

## 2018-04-22 DIAGNOSIS — H65491 Other chronic nonsuppurative otitis media, right ear: Secondary | ICD-10-CM | POA: Diagnosis not present

## 2018-04-22 DIAGNOSIS — F17210 Nicotine dependence, cigarettes, uncomplicated: Secondary | ICD-10-CM | POA: Diagnosis not present

## 2018-04-22 DIAGNOSIS — H90A22 Sensorineural hearing loss, unilateral, left ear, with restricted hearing on the contralateral side: Secondary | ICD-10-CM | POA: Diagnosis not present

## 2018-04-22 DIAGNOSIS — H90A31 Mixed conductive and sensorineural hearing loss, unilateral, right ear with restricted hearing on the contralateral side: Secondary | ICD-10-CM | POA: Diagnosis not present

## 2018-04-22 LAB — CULTURE, BLOOD (ROUTINE X 2)
Culture: NO GROWTH
Culture: NO GROWTH
Special Requests: ADEQUATE

## 2018-04-22 LAB — CULTURE, GROUP A STREP (THRC)

## 2018-04-23 ENCOUNTER — Ambulatory Visit: Payer: Self-pay

## 2018-04-23 NOTE — Telephone Encounter (Signed)
Attempted to contact patient.  LM for patient to return call.  There is a 1:40 appt available on Thurs, 04/24/2018, that patient can have for hospital follow up.  Otherwise, she will have to wait until her appointment on Friday.  Advised patient via voicemail to go to ED if she cannot secure appointment on Thursday and her symptoms worsen any further (shortness of breath, chest pain, etc.).  CRM placed.

## 2018-04-23 NOTE — Telephone Encounter (Signed)
Pt c/o mild edema to both ankles. Pt denies redness or pain. Pt was d/c'd from the hospital for pneumonia 2 days ago. Per chart review she was septic Pt has a history of Hep C. Pt c/o headache. Pt wanted appointment tomorrow but she has a hospital follow up scheduled for Friday Spoke with Marcene Brawn and updated on triage assessment and history of recent hospitalization Pt was wanting an appointment tomorrow if PCP cannot evaluate her ankles Friday at her f/u.Marcene Brawn asked to route message back to the office for review. Care advice given and pt verbalized understanding..Reason for Disposition . [1] MILD swelling of both ankles (i.e., pedal edema) AND [2] new onset or worsening  Answer Assessment - Initial Assessment Questions 1. ONSET: "When did the swelling start?" (e.g., minutes, hours, days)     today 2. LOCATION: "What part of the leg is swollen?"  "Are both legs swollen or just one leg?"     Ankles both 3. SEVERITY: "How bad is the swelling?" (e.g., localized; mild, moderate, severe)  - Localized - small area of swelling localized to one leg  - MILD pedal edema - swelling limited to foot and ankle, pitting edema < 1/4 inch (6 mm) deep, rest and elevation eliminate most or all swelling  - MODERATE edema - swelling of lower leg to knee, pitting edema > 1/4 inch (6 mm) deep, rest and elevation only partially reduce swelling  - SEVERE edema - swelling extends above knee, facial or hand swelling present      mild 4. REDNESS: "Does the swelling look red or infected?"     no 5. PAIN: "Is the swelling painful to touch?" If so, ask: "How painful is it?"   (Scale 1-10; mild, moderate or severe)     no 6. FEVER: "Do you have a fever?" If so, ask: "What is it, how was it measured, and when did it start?"      no 7. CAUSE: "What do you think is causing the leg swelling?"     Pt doesn't know 8. MEDICAL HISTORY: "Do you have a history of heart failure, kidney disease, liver failure, or cancer?"     Hep C  9.  RECURRENT SYMPTOM: "Have you had leg swelling before?" If so, ask: "When was the last time?" "What happened that time?"     Several years ago - pt doesn't remember 10. OTHER SYMPTOMS: "Do you have any other symptoms?" (e.g., chest pain, difficulty breathing)       Headache  11. PREGNANCY: "Is there any chance you are pregnant?" "When was your last menstrual period?"       n/a  Protocols used: LEG SWELLING AND EDEMA-A-AH

## 2018-04-23 NOTE — Telephone Encounter (Signed)
Per Chart Review:  Admit date: 04/17/2018 Discharge date: 04/21/2018  Time spent: 35 minutes  Recommendations for Outpatient Follow-up:  1. Patient requires outpatient counseling for substance abuse please refer you see fit 2. Will need potassium and magnesium checked outpatient setting 3 to 4 days 3. New prescriptions this admission her potassium, albuterol, spacer 4. 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 04/21/2018   How have you been since you were released from the hospital? "Fine, better."   Do you understand why you were in the hospital? yes   Do you understand the discharge instructions? yes   Where were you discharged to? Home   Items Reviewed:  Medications reviewed: yes  Allergies reviewed: yes  Dietary changes reviewed: yes  Referrals reviewed: yes   Functional Questionnaire:   Activities of Daily Living (ADLs):   She states they are independent in the following: ambulation, bathing and hygiene, feeding, continence, grooming, toileting and dressing States they require assistance with the following: N/A   Any transportation issues/concerns?: no   Any patient concerns? no   Confirmed importance and date/time of follow-up visits  scheduled yes  Provider Appointment booked with:  Dr. Jerline Pain on 04/25/2018 @ 11:20 am  Confirmed with patient if condition begins to worsen call PCP or go to the ER.  Patient was given the office number and encouraged to call back with question or concerns.  : yes

## 2018-04-23 NOTE — Telephone Encounter (Signed)
Please advise if patient can be seen before friday

## 2018-04-24 ENCOUNTER — Other Ambulatory Visit: Payer: Self-pay | Admitting: Neurosurgery

## 2018-04-24 DIAGNOSIS — M5441 Lumbago with sciatica, right side: Secondary | ICD-10-CM

## 2018-04-24 DIAGNOSIS — H65491 Other chronic nonsuppurative otitis media, right ear: Secondary | ICD-10-CM | POA: Diagnosis not present

## 2018-04-25 ENCOUNTER — Ambulatory Visit (INDEPENDENT_AMBULATORY_CARE_PROVIDER_SITE_OTHER): Payer: Medicare Other | Admitting: Physician Assistant

## 2018-04-25 ENCOUNTER — Encounter: Payer: Self-pay | Admitting: Physician Assistant

## 2018-04-25 VITALS — BP 120/80 | HR 86 | Temp 98.2°F | Ht 68.0 in | Wt 170.0 lb

## 2018-04-25 DIAGNOSIS — R609 Edema, unspecified: Secondary | ICD-10-CM

## 2018-04-25 DIAGNOSIS — M25572 Pain in left ankle and joints of left foot: Secondary | ICD-10-CM

## 2018-04-25 DIAGNOSIS — M25571 Pain in right ankle and joints of right foot: Secondary | ICD-10-CM

## 2018-04-25 LAB — BASIC METABOLIC PANEL
BUN: 8 mg/dL (ref 6–23)
CO2: 26 mEq/L (ref 19–32)
Calcium: 9.1 mg/dL (ref 8.4–10.5)
Chloride: 105 mEq/L (ref 96–112)
Creatinine, Ser: 0.62 mg/dL (ref 0.40–1.20)
GFR: 103.89 mL/min (ref >60.00)
Glucose, Bld: 119 mg/dL — ABNORMAL HIGH (ref 70–99)
Potassium: 3.7 mEq/L (ref 3.5–5.1)
Sodium: 139 mEq/L (ref 135–145)

## 2018-04-25 LAB — CBC WITH DIFFERENTIAL/PLATELET
Basophils Absolute: 0.1 10*3/uL (ref 0.0–0.1)
Basophils Relative: 0.8 % (ref 0.0–3.0)
Eosinophils Absolute: 0.4 10*3/uL (ref 0.0–0.7)
Eosinophils Relative: 4.5 % (ref 0.0–5.0)
HEMATOCRIT: 35 % — AB (ref 36.0–46.0)
Hemoglobin: 11.8 g/dL — ABNORMAL LOW (ref 12.0–15.0)
Lymphocytes Relative: 26.1 % (ref 12.0–46.0)
Lymphs Abs: 2.1 10*3/uL (ref 0.7–4.0)
MCHC: 33.7 g/dL (ref 30.0–36.0)
MCV: 93.9 fl (ref 78.0–100.0)
Monocytes Absolute: 0.5 10*3/uL (ref 0.1–1.0)
Monocytes Relative: 6.1 % (ref 3.0–12.0)
Neutro Abs: 5 10*3/uL (ref 1.4–7.7)
Neutrophils Relative %: 62.5 % (ref 43.0–77.0)
Platelets: 433 10*3/uL — ABNORMAL HIGH (ref 150.0–400.0)
RBC: 3.73 Mil/uL — ABNORMAL LOW (ref 3.87–5.11)
RDW: 13.5 % (ref 11.5–15.5)
WBC: 8 10*3/uL (ref 4.0–10.5)

## 2018-04-25 LAB — MAGNESIUM: Magnesium: 1.6 mg/dL (ref 1.5–2.5)

## 2018-04-25 MED ORDER — QUETIAPINE FUMARATE 200 MG PO TABS: 200.0000 mg | ORAL_TABLET | Freq: Every day | ORAL | 0 refills | Status: DC

## 2018-04-25 NOTE — Patient Instructions (Signed)
It was great to see you!  Keep your lower legs elevated. Continue to stay hydrated and eat regularly scheduled meals to keep your energy level up.  If no improvement of symptoms, please follow-up with [place].  Take care,  Inda Coke PA-C

## 2018-04-25 NOTE — Progress Notes (Signed)
Laura [name] is a 48 y.o. female is here for Hospital Follow up.  I acted as a Education administrator for [employer], PA-C Anselmo Pickler, LPN  History of Present Illness:   Chief Complaint  Patient presents with  . Hospitalization Follow-up    HPI  Hospital follow-up.  Patient was admitted 04/17/2018 -- 04/21/2018 for sepsis of unclear etiology. She has significant hx of chronic hep C, prior back surgeries with opioid dependence, substance abuse (alcohol and heroin.) Work-up in the hospital revealed WBC of 18, BUN/Cre 23/1.0. Negative blood cx. CT head negative, CT angio of chest showed: diffuse primarily upper lobe bilateral infiltrates. TTE was negative. Antibiotics were given initially, vanc/flagyl/cefe but after consultation with ID, abx were discontinued and patient's symptoms were monitored.  Bilateral Edema Pt c/o bilateral ankle edema since Wednesday. Pt having pain both ankles since last night -- mainly with walking. Has tried some elevation. Currently on Mobic and Gabapentin. Denies numbness or tingling. Slight SOB. She states that while she was in the hospital she had several rounds of IVF. No prior hx of ankle edema "except when I was pregnant." Denies calf pain. Has been taking potassium supplements as prescribed. Continues to smoke about 1/2 PPD.    Wt Readings from Last 15 Encounters:  04/25/18 170 lb (77.1 kg)  04/19/18 103 lb 8 oz (46.9 kg)  04/02/18 169 lb 6.4 oz (76.8 kg)  03/25/18 160 lb (72.6 kg)  03/20/18 160 lb 6.4 oz (72.8 kg)  07/31/17 153 lb (69.4 kg)  05/28/17 166 lb (75.3 kg)  05/27/17 160 lb (72.6 kg)  04/18/17 163 lb 12.8 oz (74.3 kg)  03/29/17 162 lb 3.2 oz (73.6 kg)  01/09/17 151 lb (68.5 kg)  12/17/16 163 lb 8 oz (74.2 kg)  12/11/16 153 lb 6.4 oz (69.6 kg)  07/02/16 158 lb 3.2 oz (71.8 kg)  06/29/16 161 lb (73 kg)   No LMP recorded. (Menstrual status: Perimenopausal).      Health Maintenance Due  Topic Date Due  . PAP SMEAR-Modifier   11/14/1993    Past Medical History:  Diagnosis Date  . Anxiety   . Depression   . ETOH abuse   . Hepatitis C   . Sebaceous cyst 03/20/2018  . Substance abuse ([place])   . Urinary incontinence 2019     Social History   Socioeconomic History  . Marital status: Single    Spouse name: Not on file  . Number of children: Not on file  . Years of education: Not on file  . Highest education level: Not on file  Occupational History  . Not on file  Social Needs  . Financial resource strain: Not on file  . Food insecurity:    Worry: Not on file    Inability: Not on file  . Transportation needs:    Medical: Not on file    Non-medical: Not on file  Tobacco Use  . Smoking status: Current Every Day Smoker    Packs/day: 0.50    Years: 20.00    Pack years: 10.00    Types: Cigarettes  . Smokeless tobacco: Never Used  Substance and Sexual Activity  . Alcohol use: Not Currently  . Drug use: Not Currently  . Sexual activity: Yes    Partners: Male    Birth control/protection: None, Post-menopausal  Lifestyle  . Physical activity:    Days per week: Not on file    Minutes per session: Not on file  . Stress: Not on file  Relationships  . Social connections:    Talks on phone: Not on file    Gets together: Not on file    Attends religious service: Not on file    Active member of club or organization: Not on file    Attends meetings of clubs or organizations: Not on file    Relationship status: Not on file  . Intimate partner violence:    Fear of current or ex partner: Not on file    Emotionally abused: Not on file    Physically abused: Not on file    Forced sexual activity: Not on file  Other Topics Concern  . Not on file  Social History Narrative  . Not on file    Past Surgical History:  Procedure Laterality Date  . BACK SURGERY     x5--disabled  . KNEE SURGERY Right     Family History  Family history unknown: Yes    PMHx, SurgHx, SocialHx, FamHx, Medications, and  Allergies were reviewed in the Visit Navigator and updated as appropriate.   Patient Active Problem List   Diagnosis Date Noted  . Sepsis ([place]) 04/17/2018  . AKI (acute kidney injury) ([place]) 04/17/2018  . Seizures ([place]) 04/17/2018  . Bowel incontinence 04/17/2018  . Habituation to opiate analgesics ([place]) 04/17/2018  . Bacteremia 04/17/2018  . Seizure-like activity ([place]) 04/02/2018  . Anxiety 03/20/2018  . Decreased hearing of right ear 03/20/2018  . UTI (urinary tract infection) 02/02/2018  . Suicide attempt by drug overdose ([place]) 02/02/2018  . Overdose of trazodone 02/02/2018  . Cigarette smoker 05/28/2017  . IV Drug addiction ([place]) 05/27/2017  . Uterine leiomyoma 04/18/2017  . Chronic hepatitis C without hepatic coma ([place]) 03/29/2017  . 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 Insomnia 03/29/2017  . Major depression 01/14/2017  . PTSD (post-traumatic stress disorder) 08/12/2016  . History of suicide attempt   . Alcohol use disorder, severe, dependence ([place]) 07/23/2016  . Prolonged Q-T interval on ECG 07/23/2016  . MDD (major depressive disorder), recurrent severe, without psychosis ([place]) 07/21/2016    Social History   Tobacco Use  . Smoking status: Current Every Day Smoker    Packs/day: 0.50    Years: 20.00    Pack years: 10.00    Types: Cigarettes  . Smokeless tobacco: Never Used  Substance Use Topics  . Alcohol use: Not Currently  . Drug use: Not Currently    Current Medications and Allergies:    Current Outpatient Medications:  .  albuterol (PROVENTIL HFA;VENTOLIN HFA) 108 (90 Base) MCG/ACT inhaler, Inhale 2 puffs into the lungs every 6 (six) hours as needed for wheezing or shortness of breath., Disp: 1 Inhaler, Rfl: 2 .  escitalopram (LEXAPRO) 20 MG tablet, Take 20 mg by mouth daily. , Disp: , Rfl:  .  gabapentin (NEURONTIN) 300 MG capsule, Take 300 mg by mouth 3 (three) times daily. , Disp: , Rfl:  .  meloxicam (MOBIC) 15 MG  tablet, Take 1 tablet (15 mg total) by mouth daily., Disp: 30 tablet, Rfl: 3 .  potassium chloride SA (K-DUR,KLOR-CON) 20 MEQ tablet, Take 2 tablets (40 mEq total) by mouth 2 (two) times daily., Disp: 8 tablet, Rfl: 0 .  QUEtiapine (SEROQUEL) 200 MG tablet, Take 1 tablet (200 mg total) by mouth at bedtime., Disp: 30 tablet, Rfl: 0 .  Spacer/Aero-Hold Chamber Bags MISC, 1 each by Does not apply route as needed., Disp: 1 each, Rfl: 1 .  tiZANidine (ZANAFLEX) 4 MG  tablet, Take 1 tablet (4 mg total) by mouth every 6 (six) hours as needed for muscle spasms., Disp: 30 tablet, Rfl: 0 .  traZODone (DESYREL) 100 MG tablet, Take 1 tablet (100 mg total) by mouth at bedtime as needed for sleep. For sleep (Patient taking differently: Take 100 mg by mouth at bedtime. For sleep), Disp: 30 tablet, Rfl: 11  No Known Allergies  Review of Systems   Review of Systems  Constitutional: Positive for malaise/fatigue. Negative for chills, fever and weight loss.  Cardiovascular: Positive for leg swelling (b/l ankles). Negative for chest pain and palpitations.  Gastrointestinal: Negative for abdominal pain, heartburn, nausea and vomiting.  Musculoskeletal: Positive for joint pain (b/l ankles). Negative for myalgias.  Neurological: Negative for dizziness, tingling, tremors and headaches.    Vitals:   Vitals:   04/25/18 1127  BP: 120/80  Pulse: 86  Temp: 98.2 F (36.8 C)  TempSrc: Oral  SpO2: 98%  Weight: 170 lb (77.1 kg)  Height: 5\' 8"  (1.727 m)     Body mass index is 25.85 kg/m.   Physical Exam:    Physical Exam Vitals signs and nursing note reviewed.  Constitutional:      General: She is not in acute distress.    Appearance: She is well-developed. She is not ill-appearing or toxic-appearing.  Cardiovascular:     Rate and Rhythm: Normal rate and regular rhythm.     Pulses: Normal pulses.     Heart sounds: Normal heart sounds, S1 normal and S2 normal.  Pulmonary:     Effort: Pulmonary effort is  normal.     Breath sounds: Normal breath sounds.  Musculoskeletal:     Comments: Trace bilateral LE edema. No erythema, ecchymosis, warmth. Negative Homan's sign bilaterally. No pain with palpation of either calf.  Bilateral lateral ankles with slight TTP. No TTP of achilles or medial ankles. No decreased ROM. No changes in gait.  Skin:    General: Skin is warm and dry.  Neurological:     Mental Status: She is alert.     GCS: GCS eye subscore is 4. GCS verbal subscore is 5. GCS motor subscore is 6.  Psychiatric:        Speech: Speech normal.        Behavior: Behavior normal. Behavior is cooperative.      Assessment and Plan:    Macayla was seen today for hospitalization follow-up.  Diagnoses and all orders for this visit:  Edema, unspecified type; Acute bilateral ankle pain Exam without significant findings and without red flags. Echo in hospital negative. Suspect trace fluid from IVF administration in hospital. Recommend watchful waiting of edema and bilateral ankle pain. Patient verbalized understanding of plan. Was told that if pain changes in any way or swelling worsens, or becomes SOB, needs acute work-up. Recheck lytes today. -     Basic metabolic panel -     CBC with Differential/Platelet  Hypomagnesemia Will recheck today. -     Magnesium  Other orders -     QUEtiapine (SEROQUEL) 200 MG tablet; Take 1 tablet (200 mg total) by mouth at bedtime.  . Reviewed expectations re: course of current medical issues. . Discussed self-management of symptoms. . Outlined signs and symptoms indicating need for more acute intervention. . Patient verbalized understanding and all questions were answered. . See orders for this visit as documented in the electronic medical record. . Patient received an After Visit Summary.  CMA or LPN served as scribe during this visit. History,  Physical, and Plan performed by medical provider. The above documentation has been reviewed and is accurate  and complete.  Inda Coke, PA-C [place], [place] 04/25/2018  Follow-up: No follow-ups on file.

## 2018-04-28 ENCOUNTER — Telehealth: Payer: Self-pay | Admitting: Nurse Practitioner

## 2018-04-28 ENCOUNTER — Other Ambulatory Visit: Payer: Self-pay | Admitting: *Deleted

## 2018-04-28 DIAGNOSIS — E876 Hypokalemia: Secondary | ICD-10-CM

## 2018-04-28 NOTE — Telephone Encounter (Signed)
Phone call to patient to verify medication list and allergies for myelogram procedure. Pt instructed to hold Lexapro, Seroquel and Trazodone for 48hrs prior to myelogram appointment time. Pt verbalized understanding.

## 2018-05-02 ENCOUNTER — Other Ambulatory Visit: Payer: Medicare Other

## 2018-05-12 ENCOUNTER — Inpatient Hospital Stay: Admission: RE | Admit: 2018-05-12 | Payer: Self-pay | Source: Ambulatory Visit

## 2018-05-12 ENCOUNTER — Inpatient Hospital Stay
Admission: RE | Admit: 2018-05-12 | Discharge: 2018-05-12 | Disposition: A | Discharge status: Home / Self Care | Payer: Self-pay | Source: Ambulatory Visit | Attending: Neurosurgery | Admitting: Neurosurgery

## 2018-05-12 NOTE — Discharge Instructions (Signed)
Myelogram Discharge Instructions  1. Go home and rest quietly for the next 24 hours.  It is important to lie flat for the next 24 hours.  Get up only to go to the restroom.  You may lie in the bed or on a couch on your back, your stomach, your left side or your right side.  You may have one pillow under your head.  You may have pillows between your knees while you are on your side or under your knees while you are on your back.  2. DO NOT drive today.  Recline the seat as far back as it will go, while still wearing your seat belt, on the way home.  3. You may get up to go to the bathroom as needed.  You may sit up for 10 minutes to eat.  You may resume your normal diet and medications unless otherwise indicated.  Drink lots of extra fluids today and tomorrow.  4. The incidence of headache, nausea, or vomiting is about 5% (one in 20 patients).  If you develop a headache, lie flat and drink plenty of fluids until the headache goes away.  Caffeinated beverages may be helpful.  If you develop severe nausea and vomiting or a headache that does not go away with flat bed rest, call [phone].  5. You may resume normal activities after your 24 hours of bed rest is over; however, do not exert yourself strongly or do any heavy lifting tomorrow. If when you get up you have a headache when standing, go back to bed and force fluids for another 24 hours.  6. Call your physician for a follow-up appointment.  The results of your myelogram will be sent directly to your physician by the following day.  7. If you have any questions or if complications develop after you arrive home, please call [phone].  Discharge instructions have been explained to the patient.  The patient, or the person responsible for the patient, fully understands these instructions.  YOU MAY RESTART YOUR LEXAPRO, SEROQUEL AND TRAZODONE TOMORROW 05/13/2018 AT 09:30AM.

## 2018-05-13 DIAGNOSIS — H40013 Open angle with borderline findings, low risk, bilateral: Secondary | ICD-10-CM | POA: Diagnosis not present

## 2018-05-15 ENCOUNTER — Other Ambulatory Visit: Payer: Self-pay | Admitting: Family Medicine

## 2018-05-15 NOTE — Telephone Encounter (Signed)
Zanaflex refill request  Pt of Dr. Jerline Pain.    Forgive the protocol being included.   I did not realize this was a non delegated medication.

## 2018-05-15 NOTE — Telephone Encounter (Signed)
Copied from [place] [id number]. Topic: Quick Communication - Rx Refill/Question >> May 15, 2018  3:02 PM Selinda Flavin B, [place] wrote: Medication: tiZANidine (ZANAFLEX) 4 MG tablet  Has the patient contacted their pharmacy? Yes.   (Agent: If no, request that the patient contact the pharmacy for the refill.) (Agent: If yes, when and what did the pharmacy advise?)  Preferred Pharmacy (with phone number or street name): [id number] [place], [place]: Please be advised that RX refills may take up to 3 business days. We ask that you follow-up with your pharmacy.

## 2018-05-16 MED ORDER — TIZANIDINE HCL 4 MG PO TABS: 4.0000 mg | ORAL_TABLET | Freq: Four times a day (QID) | ORAL | 0 refills | Status: DC | PRN

## 2018-05-16 NOTE — Telephone Encounter (Signed)
See note

## 2018-05-21 ENCOUNTER — Ambulatory Visit: Payer: Self-pay | Admitting: Physician Assistant

## 2018-05-21 ENCOUNTER — Other Ambulatory Visit: Payer: Self-pay

## 2018-05-21 ENCOUNTER — Telehealth: Payer: Self-pay

## 2018-05-21 ENCOUNTER — Ambulatory Visit: Payer: Self-pay | Admitting: *Deleted

## 2018-05-21 NOTE — Telephone Encounter (Signed)
Pt informed that previous appointment was scheduled in error; explained that she would be seeing Inda Coke, [place], 05/21/2018 at 1420, and lab appointment at 1445; the pt verbalized understanding; will route to office for notification; spoke with [facility]

## 2018-05-21 NOTE — Telephone Encounter (Signed)
Clarified with [facility]; will cancel lab appointment since pt will be seen for office visit.

## 2018-05-21 NOTE — Telephone Encounter (Signed)
seroquel refill; [facility]  Requested medication (s) are due for refill today: seroquel yes  Requested medication (s) are on the active medication list: yes  Last refill: 04/25/2018  Future visit scheduled: yes  Notes to clinic:  Not delegted

## 2018-05-21 NOTE — Telephone Encounter (Signed)
After reviewing information with Dr. Juleen China and [place] Bar I called patient to get more informaiton. She has been having chest tightness under left breast and chest into neck. She also has had SOB that comes and goes with exertion. Informed with those symptoms she needs to go to ED for evaluation. She became argumentative with me states all she needs is some blood work. She thinks that her vitamin levels are off. Informed her that the doctors in the office have reviewed the message and feel that they will be able to do any test or lab work that she needs to diagnose her quicker at ED. She refused to go. States that she is not in that much pain. She wanted to know how to get refill of her Seroquel 400mg . Informed her that is not dose that we have on file. That we would send message to Dr. Jerline Pain to review when he gets back in the office. She then hung up on the conversation.

## 2018-05-21 NOTE — Telephone Encounter (Addendum)
Pt called with complaints of chest pain and hair loss; the pt says that she dyed her hair last week and clumps of hair came out;  she says that she is having chest pain "like a contraction" under her left breast intermittently for a few weeks; the pt says her chest feels "sore";the pt says that  uncomfortable feeling in her neck that feels "crampish"; the pt says that she has shortness of breath due to smoking; the patient says that she was hospitalized for 5 days due to pneumonia, and she is a terrible eater and has some vitamin deficiencies; she says that she is not going to the ED;the pt would also would like to have her seroquel prescription renewed; [facility] [address];the pt would like to be seen in the office today; recommendations made per nurse triage protocol; she normally sees Dr Dimas Chyle but he is not available; pt offered and accepted appointment with Dr Orma Flaming, [facility], 05/21/2018 at 1420 and lab appointment for BMET diagnosis hypokalemia; the pt also would like to have labs rechecked per Inda Coke (recommendation to have completed on 05/22/2018 or 05/23/2018) she verbalized understanding; will route to office for notification.    Reason for Disposition . [1] Chest pain lasting <= 5 minutes AND [2] NO chest pain or cardiac symptoms now(Exceptions: pains lasting a few seconds)  Answer Assessment - Initial Assessment Questions 1. LOCATION: "Where does it hurt?"       Pain under left breast; contracting feeling  2. RADIATION: "Does the pain go anywhere else?" (e.g., into neck, jaw, arms, back)     No; chronic back issues   3. ONSET: "When did the chest pain begin?" (Minutes, hours or days)      2 weeks ago 4. PATTERN "Does the pain come and go, or has it been constant since it started?"  "Does it get worse with exertion?"      Intermittent; no pain with exertion 5. DURATION: "How long does it last" (e.g., seconds, minutes, hours)     30  seconds 6. SEVERITY: "How bad is the pain?"  (e.g., Scale 1-10; mild, moderate, or severe)    - MILD (1-3): doesn't interfere with normal activities     - MODERATE (4-7): interferes with normal activities or awakens from sleep    - SEVERE (8-10): excruciating pain, unable to do any normal activities       Rated 7 out of 10 7. CARDIAC RISK FACTORS: "Do you have any history of heart problems or risk factors for heart disease?" (e.g., prior heart attack, angina; high blood pressure, diabetes, being overweight, high cholesterol, smoking, or strong family history of heart disease)     smoker 8. PULMONARY RISK FACTORS: "Do you have any history of lung disease?"  (e.g., blood clots in lung, asthma, emphysema, birth control pills)     History of pneumonia 9. CAUSE: "What do you think is causing the chest pain?"     Due to smoking 10. OTHER SYMPTOMS: "Do you have any other symptoms?" (e.g., dizziness, nausea, vomiting, sweating, fever, difficulty breathing, cough)       No energy 11. PREGNANCY: "Is there any chance you are pregnant?" "When was your last menstrual period?"       no  Protocols used: CHEST PAIN-A-AH

## 2018-05-21 NOTE — Telephone Encounter (Signed)
See note, patient was advised by JoEllen to go to ED

## 2018-05-22 ENCOUNTER — Other Ambulatory Visit: Payer: Self-pay | Admitting: Physician Assistant

## 2018-05-22 NOTE — Telephone Encounter (Signed)
Error

## 2018-05-28 ENCOUNTER — Ambulatory Visit: Payer: Self-pay | Admitting: Family Medicine

## 2018-06-04 ENCOUNTER — Encounter: Payer: Self-pay | Admitting: Family Medicine

## 2018-06-05 ENCOUNTER — Other Ambulatory Visit: Payer: Self-pay

## 2018-06-05 ENCOUNTER — Ambulatory Visit (INDEPENDENT_AMBULATORY_CARE_PROVIDER_SITE_OTHER): Payer: Medicare Other | Admitting: Family Medicine

## 2018-06-05 ENCOUNTER — Encounter: Payer: Self-pay | Admitting: Family Medicine

## 2018-06-05 VITALS — BP 118/74 | HR 109 | Temp 98.5°F | Ht 68.0 in | Wt 170.6 lb

## 2018-06-05 DIAGNOSIS — F339 Major depressive disorder, recurrent, unspecified: Secondary | ICD-10-CM | POA: Diagnosis not present

## 2018-06-05 DIAGNOSIS — Z5181 Encounter for therapeutic drug level monitoring: Secondary | ICD-10-CM

## 2018-06-05 DIAGNOSIS — F419 Anxiety disorder, unspecified: Secondary | ICD-10-CM | POA: Diagnosis not present

## 2018-06-05 DIAGNOSIS — G6289 Other specified polyneuropathies: Secondary | ICD-10-CM | POA: Diagnosis not present

## 2018-06-05 DIAGNOSIS — Z13220 Encounter for screening for lipoid disorders: Secondary | ICD-10-CM | POA: Diagnosis not present

## 2018-06-05 DIAGNOSIS — Z79899 Other long term (current) drug therapy: Secondary | ICD-10-CM | POA: Diagnosis not present

## 2018-06-05 DIAGNOSIS — R300 Dysuria: Secondary | ICD-10-CM

## 2018-06-05 DIAGNOSIS — L659 Nonscarring hair loss, unspecified: Secondary | ICD-10-CM

## 2018-06-05 DIAGNOSIS — F332 Major depressive disorder, recurrent severe without psychotic features: Secondary | ICD-10-CM

## 2018-06-05 DIAGNOSIS — G894 Chronic pain syndrome: Secondary | ICD-10-CM

## 2018-06-05 DIAGNOSIS — G4700 Insomnia, unspecified: Secondary | ICD-10-CM | POA: Diagnosis not present

## 2018-06-05 LAB — CBC
HCT: 42.7 % (ref 36.0–46.0)
Hemoglobin: 14.3 g/dL (ref 12.0–15.0)
MCHC: 33.5 g/dL (ref 30.0–36.0)
MCV: 92.6 fl (ref 78.0–100.0)
Platelets: 304 10*3/uL (ref 150.0–400.0)
RBC: 4.61 Mil/uL (ref 3.87–5.11)
RDW: 13.7 % (ref 11.5–15.5)
WBC: 7.8 10*3/uL (ref 4.0–10.5)

## 2018-06-05 LAB — COMPREHENSIVE METABOLIC PANEL
ALT: 29 U/L (ref 0–35)
AST: 29 U/L (ref 0–37)
Albumin: 4.2 g/dL (ref 3.5–5.2)
Alkaline Phosphatase: 87 U/L (ref 39–117)
BUN: 13 mg/dL (ref 6–23)
CO2: 29 mEq/L (ref 19–32)
Calcium: 9.7 mg/dL (ref 8.4–10.5)
Chloride: 102 mEq/L (ref 96–112)
Creatinine, Ser: 0.76 mg/dL (ref 0.40–1.20)
GFR: 82.09 mL/min (ref >60.00)
Glucose, Bld: 133 mg/dL — ABNORMAL HIGH (ref 70–99)
POTASSIUM: 4.5 meq/L (ref 3.5–5.1)
Sodium: 137 mEq/L (ref 135–145)
Total Bilirubin: 0.5 mg/dL (ref 0.2–1.2)
Total Protein: 7.5 g/dL (ref 6.0–8.3)

## 2018-06-05 LAB — POCT URINALYSIS DIP (MANUAL ENTRY)
Blood, UA: NEGATIVE
Glucose, UA: NEGATIVE mg/dL
Ketones, POC UA: NEGATIVE mg/dL
Leukocytes, UA: NEGATIVE
Nitrite, UA: NEGATIVE
Spec Grav, UA: >=1.03 — AB (ref 1.010–1.025)
Urobilinogen, UA: 1 E.U./dL
pH, UA: 6 (ref 5.0–8.0)

## 2018-06-05 LAB — LIPID PANEL
Cholesterol: 190 mg/dL (ref 0–200)
HDL: 42.4 mg/dL (ref >39.00)
NonHDL: 147.63
Total CHOL/HDL Ratio: 4
Triglycerides: 220 mg/dL — ABNORMAL HIGH (ref 0.0–149.0)
VLDL: 44 mg/dL — ABNORMAL HIGH (ref 0.0–40.0)

## 2018-06-05 LAB — VITAMIN B12: Vitamin B-12: 192 pg/mL — ABNORMAL LOW (ref 211–911)

## 2018-06-05 LAB — VITAMIN D 25 HYDROXY (VIT D DEFICIENCY, FRACTURES): VITD: 30.84 ng/mL (ref 30.00–100.00)

## 2018-06-05 LAB — LDL CHOLESTEROL, DIRECT: Direct LDL: 115 mg/dL

## 2018-06-05 LAB — TSH: TSH: 3.54 u[IU]/mL (ref 0.35–4.50)

## 2018-06-05 MED ORDER — ONE DAILY MULTIVITAMIN WOMEN PO TABS: 1.0000 | ORAL_TABLET | Freq: Every day | ORAL | 3 refills | Status: DC

## 2018-06-05 MED ORDER — CEPHALEXIN 500 MG PO CAPS: 500.0000 mg | ORAL_CAPSULE | Freq: Two times a day (BID) | ORAL | 0 refills | Status: AC

## 2018-06-05 MED ORDER — BACLOFEN 10 MG PO TABS: 10.0000 mg | ORAL_TABLET | Freq: Three times a day (TID) | ORAL | 0 refills | Status: DC

## 2018-06-05 MED ORDER — MINOXIDIL 5 % EX SOLN: CUTANEOUS | 3 refills | Status: DC

## 2018-06-05 MED ORDER — QUETIAPINE FUMARATE 400 MG PO TABS: 200.0000 mg | ORAL_TABLET | Freq: Every day | ORAL | 5 refills | Status: DC

## 2018-06-05 MED ORDER — BUPROPION HCL ER (XL) 150 MG PO TB24: 150.0000 mg | ORAL_TABLET | Freq: Every day | ORAL | 0 refills | Status: DC

## 2018-06-05 NOTE — Assessment & Plan Note (Signed)
Referral to psychiatry pending.  Will start Wellbutrin 150 mg daily in the meantime.  Titrate to 450 mg daily as tolerated.

## 2018-06-05 NOTE — Assessment & Plan Note (Signed)
Referral to psychiatry pending.  Patient not candidate for benzos due to history of suicide attempt and IV drug use.  Continue Seroquel 400 mg nightly and trazodone 100 mg nightly.

## 2018-06-05 NOTE — Assessment & Plan Note (Signed)
No red flags.  Start baclofen.

## 2018-06-05 NOTE — Patient Instructions (Signed)
It was very nice to see you today!  Please start the Wellbutrin.  Take 1 pill for the next 2 days, then increase to 2 pills for a few days, then increase to 3 pills.  Please try the minoxidil for your hair loss.  We will check blood work today.  Please follow-up with psychiatrist soon.  Please start antibiotic.  We will call you with your urine culture results for me to make a change.  Take care, Dr Jerline Pain

## 2018-06-05 NOTE — Assessment & Plan Note (Signed)
Continue trazodone 100 mg nightly and Seroquel 400 mg nightly.

## 2018-06-05 NOTE — Progress Notes (Signed)
Chief Complaint:  Laura [name] is a 48 y.o. female who presents today with a chief complaint of anxiety.   Assessment/Plan:  MDD (major depressive disorder), recurrent severe, without psychosis ([place]) Referral to psychiatry pending.  Will start Wellbutrin 150 mg daily in the meantime.  Titrate to 450 mg daily as tolerated.  Insomnia Continue trazodone 100 mg nightly and Seroquel 400 mg nightly.  Chronic pain syndrome secondary to DDD and lumbar spondylosis  No red flags.  Start baclofen.  Anxiety Referral to psychiatry pending.  Patient not candidate for benzos due to history of suicide attempt and IV drug use.  Continue Seroquel 400 mg nightly and trazodone 100 mg nightly.  Alopecia Check CBC, CMP, and TSH.  Recommended topical minoxidil.  Dysuria Empirically treat for UTI with Keflex 5 mg twice daily x7 days.  Check urine culture.  High Risk Medication Use / Neuorpathy Check B12  Hypocalcemia Check vitamin D.  Preventative health care Check lipid panel.  Declined Pap smear.    Subjective:  HPI:  # Anxiety / Depression Symptoms have worsened since our last visit.  She was recently attacked by dog which worsened her symptoms.  Son was also attacked by the same dog.  Unfortunately, the dog is still living in their house.  Reportedly, patient's father is refusing to have the dog removed from the premises.  This is causing quite a bit of anxiety.  She does not have the financial ability to find a new place to live.  She is currently on Seroquel 200 mg nightly.  Depression screen PHQ 2/9 06/05/2018  Decreased Interest 3  Down, Depressed, Hopeless 3  PHQ - 2 Score 6  Altered sleeping 3  Tired, decreased energy 3  Change in appetite 3  Feeling bad or failure about yourself  3  Trouble concentrating 3  Moving slowly or fidgety/restless 3  Suicidal thoughts 0  PHQ-9 Score 24  Difficult doing work/chores -    GAD 7 : Generalized Anxiety Score 06/05/2018  Nervous,  Anxious, on Edge 3  Control/stop worrying 3  Worry too much - different things 3  Trouble relaxing 3  Restless 3  Easily annoyed or irritable 3  Afraid - awful might happen 3  Total GAD 7 Score 21  Anxiety Difficulty Very difficult   # Hair Loss Patient also reports that her hair is been falling out in clumps for the past few months.  She has been using hair dye products which she thinks is contributed.  She is also concerned about potential vitamin deficiency.  # Chronic Back Pain Worsened since last time.  On Mobic 15 mg daily which is not helping.  Has tried Zanaflex in the past which has not helped.  No worsening lower extremity weakness or numbness.   # Dysuria Started a couple of months ago. Worsening. Feels like prior UTIs. No fevers or chills.   ROS: Per HPI  PMH: She reports that she has been smoking cigarettes. She has a 10.00 pack-year smoking history. She has never used smokeless tobacco. She reports previous alcohol use. She reports previous drug use.      Objective:  Physical Exam: BP 118/74 (BP Location: Left Arm, Patient Position: Sitting, Cuff Size: Normal)   Pulse (!) 109   Temp 98.5 F (36.9 C) (Oral)   Ht 5\' 8"  (1.727 m)   Wt 170 lb 9.6 oz (77.4 kg)   SpO2 97%   BMI 25.94 kg/m   Gen: NAD, resting comfortably Neuro: Grossly  normal, moves all extremities Psych: Normal affect and thought content     Josefa [place] M. Jerline Pain, MD 06/05/2018 1:05 PM

## 2018-06-05 NOTE — Assessment & Plan Note (Signed)
Check CBC, CMP, and TSH.  Recommended topical minoxidil.

## 2018-06-06 ENCOUNTER — Telehealth: Payer: Self-pay

## 2018-06-06 LAB — URINE CULTURE
MICRO NUMBER:: 337207
SPECIMEN QUALITY:: ADEQUATE

## 2018-06-06 NOTE — Telephone Encounter (Signed)
LMOM for pt advising that we are limiting in-person visits at this time.  Asked for return call to the office.

## 2018-06-09 ENCOUNTER — Encounter: Payer: Self-pay | Admitting: Family Medicine

## 2018-06-09 DIAGNOSIS — E538 Deficiency of other specified B group vitamins: Secondary | ICD-10-CM | POA: Insufficient documentation

## 2018-06-09 NOTE — Progress Notes (Signed)
Please inform patient of the following:  Her urine culture is negative. Ok for her to stop antibiotics.  Her B12 is low. Recommend starting B12 replacement protocol. Her triglycerides and blood sugar were high, but everything else is normal. Do not need to start medications based on this. Recommend continuing diet and exercise and we can recheck in a year.   Algis Greenhouse. Jerline Pain, MD 06/09/2018 3:42 PM

## 2018-06-10 ENCOUNTER — Ambulatory Visit: Payer: Self-pay | Admitting: Neurology

## 2018-06-11 ENCOUNTER — Inpatient Hospital Stay: Admission: RE | Admit: 2018-06-11 | Payer: Self-pay | Source: Ambulatory Visit

## 2018-06-11 ENCOUNTER — Other Ambulatory Visit: Payer: Self-pay

## 2018-06-25 ENCOUNTER — Ambulatory Visit: Payer: Medicare Other | Admitting: Internal Medicine

## 2018-06-27 ENCOUNTER — Other Ambulatory Visit: Payer: Self-pay | Admitting: Family Medicine

## 2018-07-02 ENCOUNTER — Other Ambulatory Visit: Payer: Self-pay | Admitting: Family Medicine

## 2018-07-24 ENCOUNTER — Ambulatory Visit: Payer: Medicare Other | Admitting: Internal Medicine

## 2018-07-24 ENCOUNTER — Ambulatory Visit (INDEPENDENT_AMBULATORY_CARE_PROVIDER_SITE_OTHER): Payer: Medicare Other | Admitting: Family Medicine

## 2018-07-24 ENCOUNTER — Encounter: Payer: Self-pay | Admitting: Family Medicine

## 2018-07-24 ENCOUNTER — Telehealth: Payer: Self-pay | Admitting: Pharmacy Technician

## 2018-07-24 ENCOUNTER — Other Ambulatory Visit: Payer: Self-pay

## 2018-07-24 ENCOUNTER — Telehealth: Payer: Self-pay | Admitting: Family Medicine

## 2018-07-24 VITALS — BP 100/66 | HR 74 | Temp 98.0°F | Ht 68.0 in | Wt 174.0 lb

## 2018-07-24 DIAGNOSIS — Z6826 Body mass index (BMI) 26.0-26.9, adult: Secondary | ICD-10-CM | POA: Diagnosis not present

## 2018-07-24 DIAGNOSIS — G894 Chronic pain syndrome: Secondary | ICD-10-CM | POA: Diagnosis not present

## 2018-07-24 DIAGNOSIS — L739 Follicular disorder, unspecified: Secondary | ICD-10-CM | POA: Diagnosis not present

## 2018-07-24 MED ORDER — LIDOCAINE 4 % EX PTCH: 1.0000 "application " | MEDICATED_PATCH | CUTANEOUS | 0 refills | Status: DC | PRN

## 2018-07-24 MED ORDER — DOXYCYCLINE HYCLATE 100 MG PO TABS: 100.0000 mg | ORAL_TABLET | Freq: Two times a day (BID) | ORAL | 0 refills | Status: DC

## 2018-07-24 MED ORDER — CLINDAMYCIN PHOSPHATE 1 % EX GEL: Freq: Two times a day (BID) | CUTANEOUS | 0 refills | Status: DC

## 2018-07-24 MED ORDER — METHYLPREDNISOLONE ACETATE 80 MG/ML IJ SUSP
80.0000 mg | Freq: Once | INTRAMUSCULAR | Status: AC
  Administered 2018-07-24: 80 mg via INTRAMUSCULAR

## 2018-07-24 MED ORDER — KETOROLAC TROMETHAMINE 60 MG/2ML IM SOLN
60.0000 mg | Freq: Once | INTRAMUSCULAR | Status: AC
  Administered 2018-07-24: 60 mg via INTRAMUSCULAR

## 2018-07-24 NOTE — Telephone Encounter (Signed)
[facility] Patient Teacher, English as a foreign language completed.    The patient is insured through [employer].  We will continue to follow to see if copay assistance is needed.  Laura [name]. [name] Patient [facility] Phone: [phone] Fax:  [phone]

## 2018-07-24 NOTE — Telephone Encounter (Signed)
Patient called in she would like to come in to the office she has red pimple like bumps on the back of her arm. Patient doesn't want to do a virtual visit and would like to be seen today. Patient would like a call back at [phone].

## 2018-07-24 NOTE — Patient Instructions (Signed)
It was very nice to see you today!  Please start the doxycycline and Clindagel.  Let me know if your rash does not improve.  We will give you an injection of Toradol and Depo-Medrol for your back.  I will also send in a prescription for lidocaine patches.  Please come back to see our nutritionist soon.  Take care, Dr Jerline Pain

## 2018-07-24 NOTE — Telephone Encounter (Signed)
Patient has been scheduled for an appointment.

## 2018-07-24 NOTE — Progress Notes (Signed)
   Chief Complaint:  Laura [name] is a 48 y.o. female who presents for same day appointment with a chief complaint of rash.   Assessment/Plan:  Folliculitis Given widespread distribution, will start course of oral doxycycline.  Also start topical clindamycin. Follow up as needed  Chronic Low Back Pain No red flags.  Will give 80 mg of Depo-Medrol and 60 mg of Toradol today.  We will send a prescription for lidocaine patch.  She will continue her other medications.  BMI 26  Patient is concerned about 40lb weight gain over the past year.  Per our chart it looks like she is only gained about 20 pounds.  Discussed importance of healthy diet and regular exercise.  She currently consumes a lot of soda.  She will follow-up with her nutritionist.     Subjective:  HPI:  Rash, acute problem Started a few weeks ago.  Located on left upper back and arm.  Rash seems to spreading.  She has a few scattered raised lesions that are occasionally pruritic.  Has had some purulent drainage.  No specific treatments tried.  No obvious precipitating events.  No other obvious alleviating or aggravating factors.   # Chronic Low Back Pain Unfortunately has had a flareup of her back pain.  Due to overactivity.  She is interested in trying lidocaine patches.  She is currently on Mobic 15 mg daily, gabapentin 300 mg 3 times daily, and baclofen 3 times daily as needed.  No reported bowel or bladder incontinence.  ROS: Per HPI  PMH: She reports that she has been smoking cigarettes. She has a 10.00 pack-year smoking history. She has never used smokeless tobacco. She reports previous alcohol use. She reports previous drug use.      Objective:  Physical Exam: BP 100/66 (BP Location: Left Arm, Patient Position: Sitting, Cuff Size: Normal)   Pulse 74   Temp 98 F (36.7 C) (Oral)   Ht 5\' 8"  (1.727 m)   Wt 174 lb (78.9 kg)   SpO2 96%   BMI 26.46 kg/m   Wt Readings from Last 3 Encounters:  07/24/18 174 lb  (78.9 kg)  06/05/18 170 lb 9.6 oz (77.4 kg)  04/25/18 170 lb (77.1 kg)  Gen: NAD, resting comfortably CV: Regular rate and rhythm with no murmurs appreciated Pulm: Normal work of breathing, clear to auscultation bilaterally with no crackles, wheezes, or rhonchi MSK: No edema, cyanosis, or clubbing noted Skin: Discrete, scattered, erythematous papular rash involving upper back with several excoriated lesions. Neuro: Grossly normal, moves all extremities Psych: Normal affect and thought content       M. Jerline Pain, MD 07/24/2018 4:19 PM

## 2018-07-25 ENCOUNTER — Other Ambulatory Visit: Payer: Self-pay | Admitting: Family Medicine

## 2018-07-29 ENCOUNTER — Telehealth: Payer: Self-pay

## 2018-07-29 NOTE — Telephone Encounter (Signed)
Phone call to patient to verify medication list and allergies for myelogram procedure. Pt instructed to hold Wellbutrin, Seroquel, and Trazodone for 48hrs prior to myelogram appointment time. Pt verbalized understanding.

## 2018-07-30 DIAGNOSIS — Z79899 Other long term (current) drug therapy: Secondary | ICD-10-CM | POA: Diagnosis not present

## 2018-07-30 DIAGNOSIS — R002 Palpitations: Secondary | ICD-10-CM | POA: Diagnosis not present

## 2018-08-06 DIAGNOSIS — F332 Major depressive disorder, recurrent severe without psychotic features: Secondary | ICD-10-CM | POA: Diagnosis not present

## 2018-08-06 DIAGNOSIS — F329 Major depressive disorder, single episode, unspecified: Secondary | ICD-10-CM | POA: Diagnosis not present

## 2018-08-06 DIAGNOSIS — F419 Anxiety disorder, unspecified: Secondary | ICD-10-CM | POA: Diagnosis not present

## 2018-08-06 DIAGNOSIS — G4700 Insomnia, unspecified: Secondary | ICD-10-CM | POA: Diagnosis not present

## 2018-08-07 ENCOUNTER — Ambulatory Visit
Admission: RE | Admit: 2018-08-07 | Discharge: 2018-08-07 | Disposition: A | Discharge status: Home / Self Care | Payer: Medicare Other | Source: Ambulatory Visit | Attending: Neurosurgery | Admitting: Neurosurgery

## 2018-08-07 ENCOUNTER — Other Ambulatory Visit: Payer: Self-pay

## 2018-08-07 DIAGNOSIS — M5441 Lumbago with sciatica, right side: Secondary | ICD-10-CM

## 2018-08-07 DIAGNOSIS — M48061 Spinal stenosis, lumbar region without neurogenic claudication: Secondary | ICD-10-CM | POA: Diagnosis not present

## 2018-08-07 MED ORDER — MEPERIDINE HCL 50 MG/ML IJ SOLN
50.0000 mg | Freq: Once | INTRAMUSCULAR | Status: AC
  Administered 2018-08-07: 50 mg via INTRAMUSCULAR

## 2018-08-07 MED ORDER — IOPAMIDOL (ISOVUE-M 200) INJECTION 41%
18.0000 mL | Freq: Once | INTRAMUSCULAR | Status: AC
  Administered 2018-08-07: 18 mL via INTRATHECAL

## 2018-08-07 MED ORDER — DIAZEPAM 5 MG PO TABS
10.0000 mg | ORAL_TABLET | Freq: Once | ORAL | Status: AC
  Administered 2018-08-07: 10 mg via ORAL

## 2018-08-07 MED ORDER — ONDANSETRON HCL 4 MG/2ML IJ SOLN
4.0000 mg | Freq: Once | INTRAMUSCULAR | Status: AC
  Administered 2018-08-07: 4 mg via INTRAMUSCULAR

## 2018-08-07 NOTE — Discharge Instructions (Signed)
Myelogram Discharge Instructions  1. Go home and rest quietly for the next 24 hours.  It is important to lie flat for the next 24 hours.  Get up only to go to the restroom.  You may lie in the bed or on a couch on your back, your stomach, your left side or your right side.  You may have one pillow under your head.  You may have pillows between your knees while you are on your side or under your knees while you are on your back.  2. DO NOT drive today.  Recline the seat as far back as it will go, while still wearing your seat belt, on the way home.  3. You may get up to go to the bathroom as needed.  You may sit up for 10 minutes to eat.  You may resume your normal diet and medications unless otherwise indicated.  Drink lots of extra fluids today and tomorrow.  4. The incidence of headache, nausea, or vomiting is about 5% (one in 20 patients).  If you develop a headache, lie flat and drink plenty of fluids until the headache goes away.  Caffeinated beverages may be helpful.  If you develop severe nausea and vomiting or a headache that does not go away with flat bed rest, call [phone].  5. You may resume normal activities after your 24 hours of bed rest is over; however, do not exert yourself strongly or do any heavy lifting tomorrow. If when you get up you have a headache when standing, go back to bed and force fluids for another 24 hours.  6. Call your physician for a follow-up appointment.  The results of your myelogram will be sent directly to your physician by the following day.  7. If you have any questions or if complications develop after you arrive home, please call [phone].  Discharge instructions have been explained to the patient.  The patient, or the person responsible for the patient, fully understands these instructions  YOU MAY RESTART YOUR LEXAPRO, SEROQUEL, AND TRAZADONE TOMORROW 08/08/2018 at 0900.

## 2018-08-13 DIAGNOSIS — Z79899 Other long term (current) drug therapy: Secondary | ICD-10-CM | POA: Diagnosis not present

## 2018-08-18 DIAGNOSIS — M4150 Other secondary scoliosis, site unspecified: Secondary | ICD-10-CM | POA: Diagnosis not present

## 2018-08-18 DIAGNOSIS — Z6827 Body mass index (BMI) 27.0-27.9, adult: Secondary | ICD-10-CM | POA: Diagnosis not present

## 2018-08-19 ENCOUNTER — Telehealth: Payer: Self-pay | Admitting: Family Medicine

## 2018-08-19 NOTE — Telephone Encounter (Signed)
Copied from [place] [id number]. Topic: Quick Communication - Rx Refill/Question >> Aug 19, 2018  4:13 PM Rayann Heman wrote: Medication: baclofen (LIORESAL) 10 MG tablet [[id number]]  Has the patient contacted their pharmacy? no Preferred Pharmacy (with phone number or street name): [id number] - , [place] [phone] (Phone) [phone] (Fax)    Agent: Please be advised that RX refills may take up to 3 business days. We ask that you follow-up with your pharmacy.

## 2018-08-20 ENCOUNTER — Other Ambulatory Visit: Payer: Self-pay

## 2018-08-20 MED ORDER — BACLOFEN 10 MG PO TABS: 10.0000 mg | ORAL_TABLET | Freq: Three times a day (TID) | ORAL | 0 refills | Status: DC

## 2018-08-20 NOTE — Telephone Encounter (Signed)
This encounter was created in error - please disregard.

## 2018-08-20 NOTE — Telephone Encounter (Signed)
Rx sent 

## 2018-08-20 NOTE — Telephone Encounter (Signed)
See request °

## 2018-08-21 ENCOUNTER — Ambulatory Visit: Payer: Self-pay | Admitting: *Deleted

## 2018-08-21 NOTE — Telephone Encounter (Signed)
Noted. Agree with ED dispo.  Algis Greenhouse. Jerline Pain, MD 08/21/2018 4:34 PM

## 2018-08-21 NOTE — Telephone Encounter (Signed)
Forwarding to Dr. Jerline Pain as Juluis Rainier.

## 2018-08-21 NOTE — Telephone Encounter (Signed)
Patient is calling to report eye irritation and discomfort she woke with today. Patient states she went swimming and wore makeup last night- no changes in makeup.  Call to office- they do not have appointment available for in person visit to exam eye and patient does not have eye doctor she sees. Recommend UC- patient agrees to go. Reason for Disposition . [1] Eye pain/discomfort AND [2] more than mild  Answer Assessment - Initial Assessment Questions 1. ONSET: "When did the pain start?" (e.g., minutes, hours, days)     This morning 2. TIMING: "Does the pain come and go, or has it been constant since it started?" (e.g., constant, intermittent, fleeting)     constant 3. SEVERITY: "How bad is the pain?"   (Scale 1-10; mild, moderate or severe)   - MILD (1-3): doesn't interfere with normal activities    - MODERATE (4-7): interferes with normal activities or awakens from sleep    - SEVERE (8-10): excruciating pain and patient unable to do normal activities     3-4 4. LOCATION: "Where does it hurt?"  (e.g., eyelid, eye, cheekbone)     Left eye- eye pain and irritation 5. CAUSE: "What do you think is causing the pain?"     No -patient woke up this morning with irritation and pain 6. VISION: "Do you have blurred vision or changes in your vision?"      no 7. EYE DISCHARGE: "Is there any discharge (pus) from the eye(s)?"  If yes, ask: "What color is it?"      Watering-clear 8. FEVER: "Do you have a fever?" If so, ask: "What is it, how was it measured, and when did it start?"      No fever 9. OTHER SYMPTOMS: "Do you have any other symptoms?" (e.g., headache, nasal discharge, facial rash)     no 10. PREGNANCY: "Is there any chance you are pregnant?" "When was your last menstrual period?"       Irregular cycles  Protocols used: EYE PAIN-A-AH

## 2018-08-21 NOTE — Telephone Encounter (Signed)
See note

## 2018-08-31 ENCOUNTER — Other Ambulatory Visit: Payer: Self-pay | Admitting: Family Medicine

## 2018-09-01 NOTE — Telephone Encounter (Signed)
Rx request 

## 2018-09-03 ENCOUNTER — Telehealth: Payer: Self-pay

## 2018-09-03 ENCOUNTER — Encounter: Payer: Self-pay | Admitting: Obstetrics and Gynecology

## 2018-09-03 ENCOUNTER — Ambulatory Visit (INDEPENDENT_AMBULATORY_CARE_PROVIDER_SITE_OTHER): Payer: Medicare Other | Admitting: Obstetrics and Gynecology

## 2018-09-03 ENCOUNTER — Other Ambulatory Visit (HOSPITAL_COMMUNITY)
Admission: RE | Admit: 2018-09-03 | Discharge: 2018-09-03 | Disposition: A | Discharge status: Home / Self Care | Payer: Medicare Other | Source: Ambulatory Visit | Attending: Obstetrics and Gynecology | Admitting: Obstetrics and Gynecology

## 2018-09-03 ENCOUNTER — Other Ambulatory Visit: Payer: Self-pay

## 2018-09-03 VITALS — BP 102/68 | HR 80 | Temp 97.9°F | Ht 67.0 in | Wt 178.4 lb

## 2018-09-03 DIAGNOSIS — Z0000 Encounter for general adult medical examination without abnormal findings: Secondary | ICD-10-CM

## 2018-09-03 DIAGNOSIS — Z113 Encounter for screening for infections with a predominantly sexual mode of transmission: Secondary | ICD-10-CM

## 2018-09-03 DIAGNOSIS — Z1151 Encounter for screening for human papillomavirus (HPV): Secondary | ICD-10-CM | POA: Insufficient documentation

## 2018-09-03 DIAGNOSIS — Z79899 Other long term (current) drug therapy: Secondary | ICD-10-CM | POA: Diagnosis not present

## 2018-09-03 DIAGNOSIS — Z01419 Encounter for gynecological examination (general) (routine) without abnormal findings: Secondary | ICD-10-CM

## 2018-09-03 DIAGNOSIS — Z124 Encounter for screening for malignant neoplasm of cervix: Secondary | ICD-10-CM | POA: Diagnosis not present

## 2018-09-03 DIAGNOSIS — N950 Postmenopausal bleeding: Secondary | ICD-10-CM

## 2018-09-03 NOTE — Patient Instructions (Signed)
EXERCISE AND DIET:  We recommended that you start or continue a regular exercise program for good health. Regular exercise means any activity that makes your heart beat faster and makes you sweat.  We recommend exercising at least 30 minutes per day at least 3 days a week, preferably 4 or 5.  We also recommend a diet low in fat and sugar.  Inactivity, poor dietary choices and obesity can cause diabetes, heart attack, stroke, and kidney damage, among others.    ALCOHOL AND SMOKING:  Women should limit their alcohol intake to no more than 7 drinks/beers/glasses of wine (combined, not each!) per week. Moderation of alcohol intake to this level decreases your risk of breast cancer and liver damage. And of course, no recreational drugs are part of a healthy lifestyle.  And absolutely no smoking or even second hand smoke. Most people know smoking can cause heart and lung diseases, but did you know it also contributes to weakening of your bones? Aging of your skin?  Yellowing of your teeth and nails?  CALCIUM AND VITAMIN D:  Adequate intake of calcium and Vitamin D are recommended.  The recommendations for exact amounts of these supplements seem to change often, but generally speaking 1,200 mg of calcium (between diet and supplement) and 800 units of Vitamin D per day seems prudent. Certain women may benefit from higher intake of Vitamin D.  If you are among these women, your doctor will have told you during your visit.    PAP SMEARS:  Pap smears, to check for cervical cancer or precancers,  have traditionally been done yearly, although recent scientific advances have shown that most women can have pap smears less often.  However, every woman still should have a physical exam from her gynecologist every year. It will include a breast check, inspection of the vulva and vagina to check for abnormal growths or skin changes, a visual exam of the cervix, and then an exam to evaluate the size and shape of the uterus and  ovaries.  And after 46 years of age, a rectal exam is indicated to check for rectal cancers. We will also provide age appropriate advice regarding health maintenance, like when you should have certain vaccines, screening for sexually transmitted diseases, bone density testing, colonoscopy, mammograms, etc.   MAMMOGRAMS:  All women over 40 years old should have a yearly mammogram. Many facilities now offer a "3D" mammogram, which may cost around $50 extra out of pocket. If possible,  we recommend you accept the option to have the 3D mammogram performed.  It both reduces the number of women who will be called back for extra views which then turn out to be normal, and it is better than the routine mammogram at detecting truly abnormal areas.    COLON CANCER SCREENING: Now recommend starting at age 45. At this time colonoscopy is not covered for routine screening until 50. There are take home tests that can be done between 45-49.   COLONOSCOPY:  Colonoscopy to screen for colon cancer is recommended for all women at age 50.  We know, you hate the idea of the prep.  We agree, BUT, having colon cancer and not knowing it is worse!!  Colon cancer so often starts as a polyp that can be seen and removed at colonscopy, which can quite literally save your life!  And if your first colonoscopy is normal and you have no family history of colon cancer, most women don't have to have it again for   10 years.  Once every ten years, you can do something that may end up saving your life, right?  We will be happy to help you get it scheduled when you are ready.  Be sure to check your insurance coverage so you understand how much it will cost.  It may be covered as a preventative service at no cost, but you should check your particular policy.      Breast Self-Awareness Breast self-awareness means being familiar with how your breasts look and feel. It involves checking your breasts regularly and reporting any changes to your  health care provider. Practicing breast self-awareness is important. A change in your breasts can be a sign of a serious medical problem. Being familiar with how your breasts look and feel allows you to find any problems early, when treatment is more likely to be successful. All women should practice breast self-awareness, including women who have had breast implants. How to do a breast self-exam One way to learn what is normal for your breasts and whether your breasts are changing is to do a breast self-exam. To do a breast self-exam: Look for Changes  1. Remove all the clothing above your waist. 2. Stand in front of a mirror in a room with good lighting. 3. Put your hands on your hips. 4. Push your hands firmly downward. 5. Compare your breasts in the mirror. Look for differences between them (asymmetry), such as: ? Differences in shape. ? Differences in size. ? Puckers, dips, and bumps in one breast and not the other. 6. Look at each breast for changes in your skin, such as: ? Redness. ? Scaly areas. 7. Look for changes in your nipples, such as: ? Discharge. ? Bleeding. ? Dimpling. ? Redness. ? A change in position. Feel for Changes Carefully feel your breasts for lumps and changes. It is best to do this while lying on your back on the floor and again while sitting or standing in the shower or tub with soapy water on your skin. Feel each breast in the following way:  Place the arm on the side of the breast you are examining above your head.  Feel your breast with the other hand.  Start in the nipple area and make  inch (2 cm) overlapping circles to feel your breast. Use the pads of your three middle fingers to do this. Apply light pressure, then medium pressure, then firm pressure. The light pressure will allow you to feel the tissue closest to the skin. The medium pressure will allow you to feel the tissue that is a little deeper. The firm pressure will allow you to feel the tissue  close to the ribs.  Continue the overlapping circles, moving downward over the breast until you feel your ribs below your breast.  Move one finger-width toward the center of the body. Continue to use the  inch (2 cm) overlapping circles to feel your breast as you move slowly up toward your collarbone.  Continue the up and down exam using all three pressures until you reach your armpit.  Write Down What You Find  Write down what is normal for each breast and any changes that you find. Keep a written record with breast changes or normal findings for each breast. By writing this information down, you do not need to depend only on memory for size, tenderness, or location. Write down where you are in your menstrual cycle, if you are still menstruating. If you are having trouble noticing differences   in your breasts, do not get discouraged. With time you will become more familiar with the variations in your breasts and more comfortable with the exam. How often should I examine my breasts? Examine your breasts every month. If you are breastfeeding, the best time to examine your breasts is after a feeding or after using a breast pump. If you menstruate, the best time to examine your breasts is 5-7 days after your period is over. During your period, your breasts are lumpier, and it may be more difficult to notice changes. When should I see my health care provider? See your health care provider if you notice:  A change in shape or size of your breasts or nipples.  A change in the skin of your breast or nipples, such as a reddened or scaly area.  Unusual discharge from your nipples.  A lump or thick area that was not there before.  Pain in your breasts.  Anything that concerns you.  

## 2018-09-03 NOTE — Telephone Encounter (Signed)
Spoke with patient. Patient does not want to return to the office to provide urine sample. Would like to have this done at her PUS appointment once scheduled in the office.  Routing to provider and will close encounter.

## 2018-09-03 NOTE — Telephone Encounter (Signed)
Left message to call [facility] [place] [facility] at [phone].  Dr.Jertson would like patient to have her urine checked due to incontinence. Would patient like to return for a nurse visit? May have this done with her PCP provider as well if she would like.

## 2018-09-03 NOTE — Progress Notes (Addendum)
48 y.o. G60P2001 Single White or Caucasian Not Hispanic or Latino female here for annual exam and to discuss uterine fibroid on PUS in 03/2017. Pelvic ultrasound in 1/19 revealed a 1.4 cm fundal, subserosal fibroid, a right hydrosalpinx, otherwise normal. Regular cycle stopped 2 years ago. Last spotting was ~1 months ago, prior to that it had been about a year.  She had terrible hot flashes and night sweats ~4 years ago. Just occasional vasomotor symptoms currently.     Not currently sexually active. Last active 3 weeks ago. Didn't use condoms.   Normal TSH and CBC in 3/10. Previously anemic Hospitalized in 1/20 with sepsis of unclear etiology. 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  Chronic back pain. She has chronic, severe pain in her back for the last 10 years. She is having another back surgery later this year.     No LMP recorded. (Menstrual status: Perimenopausal).          Sexually active: No.  The current method of family planning is abstinence.    Exercising: Yes.    pool exercises Smoker:  Yes, 1/2 a pack a day  Health Maintenance: Pap:  Unsure History of abnormal Pap:  Yes, patient does not think she followed up MMG:  A few years ago, pt is unsure, WNL per patient BMD: Never Colonoscopy: 8 years ago per patient, unsure of results TDaP:  06/29/2016 Gardasil: No   reports that she has been smoking cigarettes. She has a 10.00 pack-year smoking history. She has never used smokeless tobacco. She reports previous alcohol use. She reports previous drug use.  Her son died 2 years ago, drowned in a kayaking accident, he was 65. On disability. On suboxone.   Past Medical History:  Diagnosis Date  . Anxiety   . Depression   . ETOH abuse   . Fibroid   . Hepatitis C   . Sebaceous cyst 03/20/2018  . Substance abuse ([place])   . Suicide attempt by drug overdose ([place]) 02/02/2018  . Urinary incontinence 2019    Past Surgical History:   Procedure Laterality Date  . APPENDECTOMY    . BACK SURGERY     x5--disabled  . KNEE SURGERY Right     Current Outpatient Medications  Medication Sig Dispense Refill  . baclofen (LIORESAL) 10 MG tablet Take 1 tablet (10 mg total) by mouth 3 (three) times daily. 90 tablet 0  . Buprenorphine HCl-Naloxone HCl 8-2 MG FILM Take 1 Film by mouth 3 (three) times daily.    Mar[place] Kitchen gabapentin (NEURONTIN) 300 MG capsule Take 300 mg by mouth 3 (three) times daily.     . QUEtiapine (SEROQUEL) 400 MG tablet TAKE 1/2 TABLET (200 MG TOTAL) BY MOUTH AT BEDTIME. 45 tablet 4  . traZODone (DESYREL) 100 MG tablet Take 1 tablet (100 mg total) by mouth at bedtime as needed for sleep. For sleep (Patient taking differently: Take 100 mg by mouth at bedtime. For sleep) 30 tablet 11  . albuterol (PROVENTIL HFA;VENTOLIN HFA) 108 (90 Base) MCG/ACT inhaler Inhale 2 puffs into the lungs every 6 (six) hours as needed for wheezing or shortness of breath. (Patient not taking: Reported on 09/03/2018) 1 Inhaler 2   No current facility-administered medications for this visit.     Family History  Family history unknown: Yes    Review of Systems  Constitutional:       Weight gain Breast tenderness  HENT: Negative.   Eyes: Negative.   Respiratory:  Negative.   Cardiovascular: Negative.   Gastrointestinal: Negative.   Endocrine: Negative.   Genitourinary:       Urinary incontinence  Musculoskeletal: Negative.   Skin: Negative.   Allergic/Immunologic: Negative.   Neurological: Negative.   Hematological: Negative.   Psychiatric/Behavioral: Negative.   The leakage is random, no urgency, not with valsalva. Occurs ~ 2 x a week. Occasionally wears a pad. Small amounts.   Exam:   BP 102/68 (BP Location: Left Arm, Patient Position: Sitting, Cuff Size: Normal)   Pulse 80   Temp 97.9 F (36.6 C) (Skin)   Ht 5\' 7"  (1.702 m)   Wt 178 lb 6.4 oz (80.9 kg)   BMI 27.94 kg/m   Weight change: @WEIGHTCHANGE @ Height:   Height: 5\' 7"   (170.2 cm)  Ht Readings from Last 3 Encounters:  09/03/18 5\' 7"  (1.702 m)  07/24/18 5\' 8"  (1.727 m)  06/05/18 5\' 8"  (1.727 m)    General appearance: alert, cooperative and appears stated age Head: Normocephalic, without obvious abnormality, atraumatic Neck: no adenopathy, supple, symmetrical, trachea midline and thyroid normal to inspection and palpation Lungs: clear to auscultation bilaterally Cardiovascular: regular rate and rhythm Breasts: normal appearance, no masses or tenderness Abdomen: soft, non-tender; non distended,  no masses,  no organomegaly Extremities: extremities normal, atraumatic, no cyanosis or edema Skin: Skin color, texture, turgor normal. No rashes or lesions Lymph nodes: Cervical, supraclavicular, and axillary nodes normal. No abnormal inguinal nodes palpated Neurologic: Grossly normal   Pelvic: External genitalia:  no lesions              Urethra:  normal appearing urethra with no masses, tenderness or lesions              Bartholins and Skenes: normal                 Vagina: normal appearing vagina with normal color and discharge, no lesions              Cervix: no lesions               Bimanual Exam:  Uterus:  normal size, contour, position, consistency, mobility, non-tender              Adnexa: no mass, fullness, tenderness               Rectovaginal: Confirms               Anus:  normal sphincter tone, no lesions  Chaperone was present for exam.  A:  Well Woman with normal exam  H/O small subserosal myoma and hydrosalpinx, not symptomatic  PMP bleeding  Hep C  Chronic pain  P:   Pap with hpv  Mammogram, # given  [place]  Return for GYN ultrasound, possible endometrial biopsy  STD testing  Discussed breast self exam  Discussed calcium and vit D intake   Addendum: some increase in urinary incontinence. Recommend a urine for ua, c&s at her next visit or with primary.

## 2018-09-04 LAB — HEP, RPR, HIV PANEL
HIV Screen 4th Generation wRfx: NONREACTIVE
Hepatitis B Surface Ag: NEGATIVE
RPR Ser Ql: NONREACTIVE

## 2018-09-04 LAB — FOLLICLE STIMULATING HORMONE: FSH: 42.5 m[IU]/mL

## 2018-09-05 DIAGNOSIS — H90A31 Mixed conductive and sensorineural hearing loss, unilateral, right ear with restricted hearing on the contralateral side: Secondary | ICD-10-CM | POA: Diagnosis not present

## 2018-09-05 DIAGNOSIS — H90A22 Sensorineural hearing loss, unilateral, left ear, with restricted hearing on the contralateral side: Secondary | ICD-10-CM | POA: Diagnosis not present

## 2018-09-05 LAB — CYTOLOGY - PAP
Diagnosis: NEGATIVE
HPV: NOT DETECTED

## 2018-09-07 LAB — CHLAMYDIA/GONOCOCCUS/TRICHOMONAS, NAA
Chlamydia by NAA: NEGATIVE
Gonococcus by NAA: NEGATIVE
Trich vag by NAA: NEGATIVE

## 2018-09-08 ENCOUNTER — Ambulatory Visit: Payer: Medicare Other | Admitting: Internal Medicine

## 2018-09-08 ENCOUNTER — Telehealth: Payer: Self-pay | Admitting: *Deleted

## 2018-09-08 ENCOUNTER — Telehealth: Payer: Self-pay | Admitting: Family Medicine

## 2018-09-08 NOTE — Telephone Encounter (Signed)
RN left message asking patient to reschedule today's missed visit. Landis Gandy, RN

## 2018-09-08 NOTE — Progress Notes (Deleted)
GYNECOLOGY  VISIT   HPI: 48 y.o.   Single White or Caucasian Not Hispanic or Latino  female   G2P2 with No LMP recorded. (Menstrual status: Perimenopausal).   here for consult following PUS.     GYNECOLOGIC HISTORY: No LMP recorded. (Menstrual status: Perimenopausal). Contraception:*** Menopausal hormone therapy: ***        OB History    Gravida  2   Para  2   Term      Preterm      AB      Living  1     SAB      TAB      Ectopic      Multiple      Live Births           Obstetric Comments  1 son deceased--drowning           Patient Active Problem List   Diagnosis Date Noted  . B12 deficiency 06/09/2018  . Alopecia 06/05/2018  . Habituation to opiate analgesics ([place]) 04/17/2018  . Seizure-like activity ([place]) 04/02/2018  . Anxiety 03/20/2018  . Cigarette smoker 05/28/2017  . IV Drug addiction ([place]) 05/27/2017  . Uterine leiomyoma 04/18/2017  . Chronic hepatitis C without hepatic coma ([place]) 03/29/2017  . 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 Insomnia 03/29/2017  . PTSD (post-traumatic stress disorder) 08/12/2016  . History of suicide attempt   . Alcohol use disorder, severe, dependence ([place]) 07/23/2016  . MDD (major depressive disorder), recurrent severe, without psychosis ([place]) 07/21/2016    Past Medical History:  Diagnosis Date  . Anxiety   . Depression   . ETOH abuse   . Fibroid   . Hepatitis C   . PTSD (post-traumatic stress disorder)    from the death of her son  . Sebaceous cyst 03/20/2018  . Substance abuse ([place])   . Suicide attempt by drug overdose ([place]) 02/02/2018  . Urinary incontinence 2019    Past Surgical History:  Procedure Laterality Date  . APPENDECTOMY    . BACK SURGERY     x5--disabled  . KNEE SURGERY Right     Current Outpatient Medications  Medication Sig Dispense Refill  . albuterol (PROVENTIL HFA;VENTOLIN HFA) 108 (90 Base) MCG/ACT inhaler Inhale 2 puffs into the lungs every  6 (six) hours as needed for wheezing or shortness of breath. (Patient not taking: Reported on 09/03/2018) 1 Inhaler 2  . baclofen (LIORESAL) 10 MG tablet Take 1 tablet (10 mg total) by mouth 3 (three) times daily. 90 tablet 0  . Buprenorphine HCl-Naloxone HCl 8-2 MG FILM Take 1 Film by mouth 3 (three) times daily.    Marland Kitchen gabapentin (NEURONTIN) 300 MG capsule Take 300 mg by mouth 3 (three) times daily.     . QUEtiapine (SEROQUEL) 400 MG tablet TAKE 1/2 TABLET (200 MG TOTAL) BY MOUTH AT BEDTIME. 45 tablet 4  . traZODone (DESYREL) 100 MG tablet Take 1 tablet (100 mg total) by mouth at bedtime as needed for sleep. For sleep (Patient taking differently: Take 100 mg by mouth at bedtime. For sleep) 30 tablet 11   No current facility-administered medications for this visit.      ALLERGIES: Patient has no known allergies.  Family History  Family history unknown: Yes    Social History   Socioeconomic History  . Marital status: Single    Spouse name: Not on file  . Number of children: Not on file  . Years of education:  Not on file  . Highest education level: Not on file  Occupational History  . Not on file  Social Needs  . Financial resource strain: Not on file  . Food insecurity    Worry: Not on file    Inability: Not on file  . Transportation needs    Medical: Not on file    Non-medical: Not on file  Tobacco Use  . Smoking status: Current Every Day Smoker    Packs/day: 0.50    Years: 20.00    Pack years: 10.00    Types: Cigarettes  . Smokeless tobacco: Never Used  Substance and Sexual Activity  . Alcohol use: Not Currently  . Drug use: Not Currently  . Sexual activity: Not Currently    Partners: Male    Birth control/protection: None, Abstinence  Lifestyle  . Physical activity    Days per week: Not on file    Minutes per session: Not on file  . Stress: Not on file  Relationships  . Social Herbalist on phone: Not on file    Gets together: Not on file    Attends  religious service: Not on file    Active member of club or organization: Not on file    Attends meetings of clubs or organizations: Not on file    Relationship status: Not on file  . Intimate partner violence    Fear of current or ex partner: Not on file    Emotionally abused: Not on file    Physically abused: Not on file    Forced sexual activity: Not on file  Other Topics Concern  . Not on file  Social History Narrative  . Not on file    ROS  PHYSICAL EXAMINATION:    There were no vitals taken for this visit.    General appearance: alert, cooperative and appears stated age Neck: no adenopathy, supple, symmetrical, trachea midline and thyroid {CHL AMB PHY EX THYROID NORM [phone]::"normal to inspection and palpation"} Breasts: {Exam; breast:13139::"normal appearance, no masses or tenderness"} Abdomen: soft, non-tender; non distended, no masses,  no organomegaly  Pelvic: External genitalia:  no lesions              Urethra:  normal appearing urethra with no masses, tenderness or lesions              Bartholins and Skenes: normal                 Vagina: normal appearing vagina with normal color and discharge, no lesions              Cervix: {CHL AMB PHY EX CERVIX NORM [phone]::"no lesions"}              Bimanual Exam:  Uterus:  {CHL AMB PHY EX UTERUS NORM [phone]::"normal size, contour, position, consistency, mobility, non-tender"}              Adnexa: {CHL AMB PHY EX ADNEXA NO MASS [phone]::"no mass, fullness, tenderness"}              Rectovaginal: {yes no:[phone]}.  Confirms.              Anus:  normal sphincter tone, no lesions  Chaperone was present for exam.  ASSESSMENT     PLAN    An After Visit Summary was printed and given to the patient.  *** minutes face to face time of which over 50% was spent in counseling.

## 2018-09-08 NOTE — Telephone Encounter (Signed)
Patient is calling to schedule an appt with Dr. Jerline Pain please advise [phone]

## 2018-09-09 ENCOUNTER — Ambulatory Visit: Payer: Medicare Other

## 2018-09-09 ENCOUNTER — Other Ambulatory Visit: Payer: Medicare Other | Admitting: Obstetrics and Gynecology

## 2018-09-09 ENCOUNTER — Other Ambulatory Visit: Payer: Self-pay

## 2018-09-09 ENCOUNTER — Telehealth: Payer: Self-pay | Admitting: Obstetrics and Gynecology

## 2018-09-09 ENCOUNTER — Ambulatory Visit: Payer: Medicare Other | Admitting: Family Medicine

## 2018-09-09 NOTE — Telephone Encounter (Signed)
Called pt to schedule appt. No answer, LVM 

## 2018-09-09 NOTE — Telephone Encounter (Signed)
See note

## 2018-09-09 NOTE — Telephone Encounter (Signed)
The patient has PMP bleeding, please make sure she reschedules.

## 2018-09-09 NOTE — Telephone Encounter (Signed)
Patient dnka her PUS appointment today. I left her a message to call and reschedule.

## 2018-09-10 ENCOUNTER — Telehealth: Payer: Self-pay | Admitting: *Deleted

## 2018-09-10 ENCOUNTER — Other Ambulatory Visit: Payer: Self-pay | Admitting: Otolaryngology

## 2018-09-10 ENCOUNTER — Ambulatory Visit: Payer: Medicare Other | Admitting: Internal Medicine

## 2018-09-10 ENCOUNTER — Ambulatory Visit (INDEPENDENT_AMBULATORY_CARE_PROVIDER_SITE_OTHER): Payer: Medicare Other | Admitting: Family Medicine

## 2018-09-10 ENCOUNTER — Other Ambulatory Visit: Payer: Self-pay

## 2018-09-10 DIAGNOSIS — H90A31 Mixed conductive and sensorineural hearing loss, unilateral, right ear with restricted hearing on the contralateral side: Secondary | ICD-10-CM

## 2018-09-10 DIAGNOSIS — R059 Cough, unspecified: Secondary | ICD-10-CM

## 2018-09-10 DIAGNOSIS — Z20822 Contact with and (suspected) exposure to covid-19: Secondary | ICD-10-CM

## 2018-09-10 DIAGNOSIS — R05 Cough: Secondary | ICD-10-CM | POA: Diagnosis not present

## 2018-09-10 MED ORDER — DOXYCYCLINE HYCLATE 100 MG PO CAPS: 100.0000 mg | ORAL_CAPSULE | Freq: Two times a day (BID) | ORAL | 0 refills | Status: AC

## 2018-09-10 NOTE — Telephone Encounter (Signed)
-----   Message from Anibal Henderson, [place] sent at 09/10/2018 11:32 AM EDT ----- Regarding: COVID 19 Test

## 2018-09-10 NOTE — Telephone Encounter (Signed)
LM on VM for patient to call back @ [phone] M-F 7a-7p to schedule a covid testing appt. Instructions given and order placed

## 2018-09-10 NOTE — Progress Notes (Signed)
Patient ID: Laura [name], female   DOB: Nov 25, 1970, 48 y.o.   MRN: [id number]  This visit type was conducted due to national recommendations for restrictions regarding the COVID-19 pandemic in an effort to limit this patient's exposure and mitigate transmission in our community.   Virtual Visit via Telephone Note  I connected with Laura [name] on 09/10/18 at 11:00 AM EDT by telephone and verified that I am speaking with the correct person using two identifiers.   I discussed the limitations, risks, security and privacy concerns of performing an evaluation and management service by telephone and the availability of in person appointments. I also discussed with the patient that there may be a patient responsible charge related to this service. The patient expressed understanding and agreed to proceed.  Location patient: home Location provider: work or home office Participants present for the call: patient, provider Patient did not have a visit in the prior 7 days to address this/these issue(s).   History of Present Illness:   Patient relates 3 to 4-day history of cough.  She has ongoing nicotine use about a pack per day.  No known COPD or asthma.  She had admission back in February with reported sepsis and pneumonia.  Her cough has been productive mostly of clear phlegm.  She is concerned about recurrent pneumonia.  No dyspnea.  Some fatigue.  No body aches.  Denies any nasal congestion.  No sore throat.  No loss of taste or smell.  No known sick contacts.  She is not aware of any significant wheezing.  Denies fever.  No chills.   Observations/Objective: Patient sounds cheerful and well on the phone. I do not appreciate any SOB. Speech and thought processing are grossly intact. Patient reported vitals:  Assessment and Plan:  Cough.  Patient has history of ongoing nicotine use.  Recent pneumonia with presumed sepsis back in February.  Suspect acute bronchial infection.  She is not in  any distress at this point.  Denies any red flags such as fever or dyspnea.  -We discussed COVID-19 screening and she agrees and this will be set up -Follow-up immediately with primary for any increasing fever or dyspnea -We agreed to starting doxycycline 100 mg twice daily if COVID screening negative -She is encouraged to quit smoking  Follow Up Instructions:  -As above.  Patient has been referred for COVID-19 screening  [id number] 5-10 [id number] 11-20 99443 21-30 I did not refer this patient for an OV in the next 24 hours for this/these issue(s).  I discussed the assessment and treatment plan with the patient. The patient was provided an opportunity to ask questions and all were answered. The patient agreed with the plan and demonstrated an understanding of the instructions.   The patient was advised to call back or seek an in-person evaluation if the symptoms worsen or if the condition fails to improve as anticipated.  I provided 15 minutes of non-face-to-face time during this encounter.   Laura Littler, MD

## 2018-09-17 ENCOUNTER — Emergency Department (HOSPITAL_COMMUNITY)
Admission: EM | Admit: 2018-09-17 | Discharge: 2018-09-17 | Disposition: A | Discharge status: Home / Self Care | Payer: Medicare Other | Attending: Emergency Medicine | Admitting: Emergency Medicine

## 2018-09-17 ENCOUNTER — Other Ambulatory Visit: Payer: Self-pay

## 2018-09-17 ENCOUNTER — Emergency Department (HOSPITAL_COMMUNITY): Payer: Medicare Other

## 2018-09-17 ENCOUNTER — Encounter (HOSPITAL_COMMUNITY): Payer: Self-pay | Admitting: Family Medicine

## 2018-09-17 DIAGNOSIS — G8929 Other chronic pain: Secondary | ICD-10-CM | POA: Insufficient documentation

## 2018-09-17 DIAGNOSIS — M79605 Pain in left leg: Secondary | ICD-10-CM | POA: Insufficient documentation

## 2018-09-17 DIAGNOSIS — M25561 Pain in right knee: Secondary | ICD-10-CM | POA: Diagnosis not present

## 2018-09-17 DIAGNOSIS — M4317 Spondylolisthesis, lumbosacral region: Secondary | ICD-10-CM | POA: Diagnosis not present

## 2018-09-17 DIAGNOSIS — R531 Weakness: Secondary | ICD-10-CM | POA: Insufficient documentation

## 2018-09-17 DIAGNOSIS — M79604 Pain in right leg: Secondary | ICD-10-CM | POA: Diagnosis not present

## 2018-09-17 DIAGNOSIS — Z79899 Other long term (current) drug therapy: Secondary | ICD-10-CM | POA: Diagnosis not present

## 2018-09-17 DIAGNOSIS — M25562 Pain in left knee: Secondary | ICD-10-CM | POA: Insufficient documentation

## 2018-09-17 DIAGNOSIS — M47816 Spondylosis without myelopathy or radiculopathy, lumbar region: Secondary | ICD-10-CM | POA: Diagnosis not present

## 2018-09-17 DIAGNOSIS — M545 Low back pain, unspecified: Secondary | ICD-10-CM

## 2018-09-17 DIAGNOSIS — F17210 Nicotine dependence, cigarettes, uncomplicated: Secondary | ICD-10-CM | POA: Diagnosis not present

## 2018-09-17 DIAGNOSIS — M48061 Spinal stenosis, lumbar region without neurogenic claudication: Secondary | ICD-10-CM | POA: Diagnosis not present

## 2018-09-17 LAB — COMPREHENSIVE METABOLIC PANEL
ALT: 32 U/L (ref 0–44)
AST: 40 U/L (ref 15–41)
Albumin: 4.5 g/dL (ref 3.5–5.0)
Alkaline Phosphatase: 110 U/L (ref 38–126)
Anion gap: 10 (ref 5–15)
BUN: 12 mg/dL (ref 6–20)
CO2: 24 mmol/L (ref 22–32)
Calcium: 9 mg/dL (ref 8.9–10.3)
Chloride: 110 mmol/L (ref 98–111)
Creatinine, Ser: 0.51 mg/dL (ref 0.44–1.00)
GFR calc Af Amer: >60 mL/min (ref >60)
GFR calc non Af Amer: >60 mL/min (ref >60)
Glucose, Bld: 109 mg/dL — ABNORMAL HIGH (ref 70–99)
Potassium: 3.8 mmol/L (ref 3.5–5.1)
Sodium: 144 mmol/L (ref 135–145)
Total Bilirubin: 0.7 mg/dL (ref 0.3–1.2)
Total Protein: 8.4 g/dL — ABNORMAL HIGH (ref 6.5–8.1)

## 2018-09-17 LAB — CBC WITH DIFFERENTIAL/PLATELET
Abs Immature Granulocytes: 0.02 10*3/uL (ref 0.00–0.07)
Basophils Absolute: 0.1 10*3/uL (ref 0.0–0.1)
Basophils Relative: 1 %
Eosinophils Absolute: 0.2 10*3/uL (ref 0.0–0.5)
Eosinophils Relative: 2 %
HCT: 43.9 % (ref 36.0–46.0)
Hemoglobin: 14.9 g/dL (ref 12.0–15.0)
Immature Granulocytes: 0 %
Lymphocytes Relative: 44 %
Lymphs Abs: 4.1 10*3/uL — ABNORMAL HIGH (ref 0.7–4.0)
MCH: 31.3 pg (ref 26.0–34.0)
MCHC: 33.9 g/dL (ref 30.0–36.0)
MCV: 92.2 fL (ref 80.0–100.0)
Monocytes Absolute: 0.5 10*3/uL (ref 0.1–1.0)
Monocytes Relative: 6 %
Neutro Abs: 4.5 10*3/uL (ref 1.7–7.7)
Neutrophils Relative %: 47 %
Platelets: 236 10*3/uL (ref 150–400)
RBC: 4.76 MIL/uL (ref 3.87–5.11)
RDW: 14.3 % (ref 11.5–15.5)
WBC: 9.4 10*3/uL (ref 4.0–10.5)
nRBC: 0 % (ref 0.0–0.2)

## 2018-09-17 LAB — URINALYSIS, ROUTINE W REFLEX MICROSCOPIC
Bilirubin Urine: NEGATIVE
Glucose, UA: NEGATIVE mg/dL
Hgb urine dipstick: NEGATIVE
Ketones, ur: NEGATIVE mg/dL
Leukocytes,Ua: NEGATIVE
Nitrite: NEGATIVE
Protein, ur: NEGATIVE mg/dL
Specific Gravity, Urine: 1 — ABNORMAL LOW (ref 1.005–1.030)
pH: 6 (ref 5.0–8.0)

## 2018-09-17 LAB — PREGNANCY, URINE: Preg Test, Ur: NEGATIVE

## 2018-09-17 MED ORDER — HYDROMORPHONE HCL 1 MG/ML IJ SOLN
1.0000 mg | Freq: Once | INTRAMUSCULAR | Status: AC
  Administered 2018-09-17: 1 mg via INTRAMUSCULAR
  Filled 2018-09-17: qty 1

## 2018-09-17 MED ORDER — GABAPENTIN 300 MG PO CAPS
300.0000 mg | ORAL_CAPSULE | Freq: Once | ORAL | Status: DC
  Filled 2018-09-17 (×2): qty 1

## 2018-09-17 MED ORDER — BACLOFEN 10 MG PO TABS
10.0000 mg | ORAL_TABLET | Freq: Three times a day (TID) | ORAL | Status: DC
  Filled 2018-09-17: qty 1

## 2018-09-17 NOTE — ED Notes (Signed)
Patient left before receiving discharge instructions. She stated she needed to tell her father to wait on her, but never returned. She didn't have any prescription medications. Patient ambulated outside of facility with a steady gait.

## 2018-09-17 NOTE — ED Notes (Signed)
Patient is MRI.

## 2018-09-17 NOTE — ED Notes (Signed)
When I entered the room to administer pain medication, patient was lying on her right side, resting quietly. I informed patient about the pain medication, she asked me if the doctor knew what was wrong with her. I told the doctor had not made a disposition. She got upset, refused the medication, and wanted the IV out. Then, I asked if she was going to leave. She denied she was going to leave but wanted to know what was wrong with her. Once again, I asked if patient wanted the pain medication, and she refused for a second time.

## 2018-09-17 NOTE — ED Provider Notes (Signed)
[facility] DEPT Provider Note   CSN: [id number] Arrival date & time: 09/17/18  1903    History   Chief Complaint Chief Complaint  Patient presents with   Back Pain   Knee Pain    HPI Laura [name] is a 48 y.o. female with a PMH of Anxiety, Depression, Hepatitis C, Substance abuse, and PTSD presents with severe bilateral knee pain radiating to right sided low back pain onset this morning. Patient reports she has had chronic back pain for 10 years. Patient states she has had 5 back surgeries and has an appointment scheduled with a neurosurgeon in July. Patient reports she has taken gabapentin and baclofen without relief. Patient denies taking these medications today. Patient reports walking makes symptoms worse. Patient states nothing makes symptoms better. Patient states she was on her feet and walking for several hours yesterday. Patient reports bilateral leg weakness. Patient reports trouble ambulating. Patient denies numbness, tingling, incontinence to bowel/bladder, fever, chills, IV drug use, or hx of cancer. Patient reports tobacco and occasional alcohol use. Patient denies recent drug use, but states she is on Buprenorphine and has been on treatment for 6 months. Patient denies injury. Patient states she is not on treatment for Hepatitis C.      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 ([place]) 04/17/2018   Seizure-like activity ([place]) 04/02/2018   Anxiety 03/20/2018   Cigarette smoker 05/28/2017   IV Drug addiction  ([place]) 05/27/2017   Uterine leiomyoma 04/18/2017   Chronic hepatitis C without hepatic coma ([place]) 03/29/2017   Chronic pain syndrome secondary to DDD and lumbar spondylosis  03/29/2017   Insomnia 03/29/2017   PTSD (post-traumatic stress disorder) 08/12/2016   History of suicide attempt    Alcohol use disorder, severe, dependence ([place]) 07/23/2016   MDD (major depressive disorder), recurrent severe, without psychosis ([place]) 07/21/2016    Past Surgical History:  Procedure Laterality Date   APPENDECTOMY     BACK SURGERY     x5--disabled   KNEE SURGERY Right      OB History    Gravida  2   Para  2   Term      Preterm      AB      Living  1     SAB      TAB      Ectopic      Multiple      Live Births           Obstetric Comments  1 son deceased--drowning         Home Medications    Prior to Admission medications   Medication Sig Start Date End Date Taking? Authorizing Provider  albuterol (PROVENTIL HFA;VENTOLIN HFA) 108 (90 Base) MCG/ACT inhaler Inhale 2 puffs into the lungs every 6 (six) hours as needed for wheezing or shortness of breath. Patient not taking: Reported on 09/03/2018 04/21/18   Nita Sells, MD  baclofen (LIORESAL) 10 MG tablet Take 1  tablet (10 mg total) by mouth 3 (three) times daily. 08/20/18   Vivi Barrack, MD  Buprenorphine HCl-Naloxone HCl 8-2 MG FILM Take 1 Film by mouth 3 (three) times daily. 08/13/18   [provider]  doxycycline (VIBRAMYCIN) 100 MG capsule Take 1 capsule (100 mg total) by mouth 2 (two) times daily. 09/10/18   Burchette, Alinda Sierras, MD  gabapentin (NEURONTIN) 300 MG capsule Take 300 mg by mouth 3 (three) times daily.  04/17/18   [provider]  QUEtiapine (SEROQUEL) 400 MG tablet TAKE 1/2 TABLET (200 MG TOTAL) BY MOUTH AT BEDTIME. 06/30/18   Vivi Barrack, MD  traZODone (DESYREL) 100 MG tablet Take 1 tablet (100 mg total) by mouth at bedtime as needed for sleep. For sleep Patient taking  differently: Take 100 mg by mouth at bedtime. For sleep 03/20/18   Vivi Barrack, MD    Family History Family History  Family history unknown: Yes    Social History Social History   Tobacco Use   Smoking status: Current Every Day Smoker    Packs/day: 0.50    Years: 20.00    Pack years: 10.00    Types: Cigarettes   Smokeless tobacco: Never Used  Substance Use Topics   Alcohol use: Not Currently   Drug use: Not Currently     Allergies   Patient has no known allergies.   Review of Systems Review of Systems  Constitutional: Negative for activity change, chills, diaphoresis, fever and unexpected weight change.  Respiratory: Negative for cough and shortness of breath.   Cardiovascular: Negative for chest pain, palpitations and leg swelling.  Gastrointestinal: Negative for abdominal pain, constipation, diarrhea, nausea and vomiting.  Genitourinary: Negative for difficulty urinating, dysuria, flank pain and hematuria.  Musculoskeletal: Positive for arthralgias, back pain and gait problem. Negative for joint swelling, myalgias, neck pain and neck stiffness.  Skin: Negative for rash and wound.  Allergic/Immunologic: Negative for immunocompromised state.  Neurological: Positive for weakness. Negative for dizziness, syncope and numbness.  Hematological: Does not bruise/bleed easily.    Physical Exam Updated Vital Signs BP (!) 140/106 (BP Location: Left Arm)    Pulse 97    Temp 98.3 F (36.8 C) (Oral)    Resp 18    Ht 5\' 7"  (1.702 m)    Wt 79.4 kg    SpO2 100%    BMI 27.41 kg/m   Physical Exam Vitals signs and nursing note reviewed.  Constitutional:      General: She is not in acute distress.    Appearance: She is well-developed. She is not diaphoretic.     Comments: Patient appears uncomfortable during exam and is tearful during history.   HENT:     Head: Normocephalic and atraumatic.  Neck:     Musculoskeletal: Normal range of motion.  Cardiovascular:     Rate and  Rhythm: Normal rate and regular rhythm.     Heart sounds: Normal heart sounds. No murmur. No friction rub. No gallop.   Pulmonary:     Effort: Pulmonary effort is normal. No respiratory distress.     Breath sounds: Normal breath sounds. No wheezing or rales.  Abdominal:     Palpations: Abdomen is soft.     Tenderness: There is no abdominal tenderness.  Genitourinary:    Rectum: Normal. No tenderness or external hemorrhoid. Normal anal tone.  Musculoskeletal:     Right knee: She exhibits normal range of motion, no swelling, no effusion and no ecchymosis. Tenderness (Generalized tenderness upon  palpation of knees bilaterally.) found.     Left knee: She exhibits normal range of motion, no swelling and no effusion. Tenderness (Generalized tenderness upon palpation of knees. ) found.     Right ankle: Normal. She exhibits normal range of motion, no swelling and no ecchymosis. No tenderness.     Left ankle: Normal. She exhibits normal range of motion, no swelling and no ecchymosis. No tenderness.     Cervical back: Normal. She exhibits normal range of motion, no tenderness, no bony tenderness and no swelling.     Thoracic back: She exhibits normal range of motion, no tenderness, no bony tenderness and no swelling.     Lumbar back: She exhibits decreased range of motion, tenderness and bony tenderness.     Right upper leg: She exhibits tenderness. She exhibits no bony tenderness.     Left upper leg: She exhibits tenderness. She exhibits no bony tenderness and no swelling.     Right lower leg: Normal. She exhibits no tenderness, no bony tenderness and no swelling.     Left lower leg: Normal. She exhibits no tenderness, no bony tenderness and no swelling.     Comments: No skin changes noted. Midline lumbar spine and right paraspinal lumbar tenderness on exam. Decreased ROM of lumbar spine due to pain. 2+ DP pulses. Sensation intact. 4/5 strength in lower extremities bilaterally. Patient is able to take a  few steps but is unable to continue to ambulate due to pain.   Skin:    General: Skin is warm.     Findings: No erythema or rash.  Neurological:     Mental Status: She is alert.      ED Treatments / Results  Labs (all labs ordered are listed, but only abnormal results are displayed) Labs Reviewed  COMPREHENSIVE METABOLIC PANEL - Abnormal; Notable for the following components:      Result Value   Glucose, Bld 109 (*)    Total Protein 8.4 (*)    All other components within normal limits  CBC WITH DIFFERENTIAL/PLATELET - Abnormal; Notable for the following components:   Lymphs Abs 4.1 (*)    All other components within normal limits  URINALYSIS, ROUTINE W REFLEX MICROSCOPIC - Abnormal; Notable for the following components:   Color, Urine STRAW (*)    Specific Gravity, Urine 1.000 (*)    All other components within normal limits  PREGNANCY, URINE    EKG None  Radiology Mr Lumbar Spine Wo Contrast  Result Date: 09/17/2018 CLINICAL DATA:  48 y/o  F; lower back pain and bilateral knee pain. EXAM: MRI LUMBAR SPINE WITHOUT CONTRAST TECHNIQUE: Multiplanar, multisequence MR imaging of the lumbar spine was performed. No intravenous contrast was administered. COMPARISON:  04/04/2018 lumbar spine MRI. 08/07/2018 CT myelogram lumbar spine. FINDINGS: Segmentation:  Standard. Alignment: Moderate levocurvature at L4. Straightening of lumbar lordosis. L5-S1 2 mm retrolisthesis is stable. Vertebrae: L4-5 interbody fusion. L4 and L5 right pedicle mild edema without fracture, probably stress reaction. Degenerative anterior endplate edema at L4-5 on the right. No findings of acute fracture or discitis. Conus medullaris and cauda equina: Conus extends to the L1-2 level. Conus and cauda equina appear normal. Paraspinal and other soft tissues: Negative. Disc levels: L1-2: Stable mild disc bulge and facet hypertrophy. No significant foraminal or spinal canal stenosis. L2-3: Stable mild disc bulge with  left-greater-than-right facet hypertrophy. Mild narrowing of left lateral recess. No significant foraminal or spinal canal stenosis. L3-4: Stable disc bulge with left subarticular and foraminal protrusion  as well as facet and ligamentum flavum hypertrophy. Mild bilateral neural foraminal stenosis and lateral recess stenosis. Mild-to-moderate spinal canal stenosis. L4-5: Interbody fusion. Intervertebral ossific ridging eccentric to right foraminal and extraforaminal zones result in mild-to-moderate neural foraminal stenosis. No spinal canal or left foraminal stenosis. L5-S1: Stable retrolisthesis, mild disc bulge, L5 endplate marginal osteophytes, and facet hypertrophy. Mild bilateral neural foraminal and lateral recess stenosis. No significant spinal canal stenosis. IMPRESSION: 1. Stable moderate levocurvature with apex at L4. Stable L5-S1 grade 1 retrolisthesis. 2. Mild edema within right L4 and L5 pedicles without fracture, probably stress reaction. 3. Stable lumbar spondylosis. Mild-to-moderate L3-4 spinal canal stenosis. Multilevel mild and moderate neural foraminal stenosis. Electronically Signed   By: Kristine Garbe M.D.   On: 09/17/2018 22:27    Procedures Procedures (including critical care time)  Medications Ordered in ED Medications  HYDROmorphone (DILAUDID) injection 1 mg (1 mg Intramuscular Given 09/17/18 2103)     Initial Impression / Assessment and Plan / ED Course  I have reviewed the triage vital signs and the nursing notes.  Pertinent labs & imaging results that were available during my care of the patient were reviewed by me and considered in my medical decision making (see chart for details).  Clinical Course as of Sep 16 2241  Wed Sep 17, 2018  2050 WBCs are within normal limits.   WBC: 9.4 [AH]  2239 Patient is requesting to be discharged at this time.    [AH]  2239 1. Stable moderate levocurvature with apex at L4. Stable L5-S1 grade 1 retrolisthesis. 2. Mild  edema within right L4 and L5 pedicles without fracture, probably stress reaction. 3. Stable lumbar spondylosis. Mild-to-moderate L3-4 spinal canal stenosis. Multilevel mild and moderate neural foraminal stenosis.    MR Lumbar Spine Wo Contrast [AH]    Clinical Course User Index [AH] Arville Lime, PA-C      Patient presents with back pain. Labs and vitals reviewed. WBCs are within normal limits. Patient is afebrile. Ordered MRI due to difficulty with ambulation, bilateral leg weakness, and history of back problems. MRI reveals stable findings without acute abnormalities.  Conus extends to the L1-2 level. Conus and cauda equina appear normal on MRI. Pain partially improved with Dilaudid. Patient is able to ambulate at this time. Patient is requesting to be discharged at this time. Advised patient to follow up with neurosurgeon and PCP. Discussed elevated BP during today's visit. Patient denies any symptoms of hypertensive crisis. Advised patient to follow up with PCP. Advised patient to resume home medications as prescribed. Patient states she understands and agrees with plan.  Findings and plan of care discussed with supervising physician Dr. Regenia Skeeter.   Final Clinical Impressions(s) / ED Diagnoses   Final diagnoses:  Chronic bilateral low back pain without sciatica    ED Discharge Orders    None       Arville Lime, PA-C 09/17/18 2244    Sherwood Gambler, MD 09/18/18 1651

## 2018-09-17 NOTE — ED Triage Notes (Signed)
Patient is complaining of bilateral knee pain that radiates to her lower back. Patient states she is waiting back surgery. Patient states she went for walk yesterday that lasted a couple of hours and this morning the pain was unbearable. Patient is tearful but able to walk with a steady gait. Denies numbness and tingling.

## 2018-09-17 NOTE — Discharge Instructions (Addendum)
You have been seen today for back pain. Please read and follow all provided instructions.   1. Medications: resume usual home medications 2. Treatment: rest, drink plenty of fluids 3. Follow Up: Please call and schedule an appointment with your neurosurgeon for your back pain. Please follow up with your primary doctor in 2 days for discussion of your diagnoses and further evaluation after today's visit; if you do not have a primary care doctor use the resource guide provided to find one; Please return to the ER for any new or worsening symptoms. Please obtain all of your results from medical records or have your doctors office obtain the results - share them with your doctor - you should be seen at your doctors office. Call today to arrange your follow up.  ?  You should return to the ER if you develop severe or worsening symptoms.   Emergency Department Resource Guide 1) Find a Doctor and Pay Out of Pocket Although you won't have to find out who is covered by your insurance plan, it is a good idea to ask around and get recommendations. You will then need to call the office and see if the doctor you have chosen will accept you as a new patient and what types of options they offer for patients who are self-pay. Some doctors offer discounts or will set up payment plans for their patients who do not have insurance, but you will need to ask so you aren't surprised when you get to your appointment.  2) Contact Your Local Health Department Not all health departments have doctors that can see patients for sick visits, but many do, so it is worth a call to see if yours does. If you don't know where your local health department is, you can check in your phone book. The CDC also has a tool to help you locate your state's health department, and many state websites also have listings of all of their local health departments.  3) Find a [facility] If your illness is not likely to be very severe or  complicated, you may want to try a walk in clinic. These are popping up all over the country in pharmacies, drugstores, and shopping centers. They're usually staffed by nurse practitioners or physician assistants that have been trained to treat common illnesses and complaints. They're usually fairly quick and inexpensive. However, if you have serious medical issues or chronic medical problems, these are probably not your best option.  No Primary Care Doctor: Call Health Connect at  [phone] - they can help you locate a primary care doctor that  accepts your insurance, provides certain services, etc. Physician Referral Service- [phone]  Chronic Pain Problems: Organization         Address  Phone   Notes  [facility]  [phone] Patients need to be referred by their primary care doctor.   Medication Assistance: Organization         Address  Phone   Notes  [facility] Med[facility] [address] [place], [place] [address] [phone] --Must be a resident of [facility] -- Must have NO insurance coverage whatsoever (no Medicaid/ Medicare, etc.) -- The pt. MUST have a primary care doctor that directs their care regularly and follows them in the community   [facility]  [phone]   [employer]  [phone]    Agencies that provide inexpensive medical care: Organization  Address  Phone   Notes  [place]  [phone]   [facility]    [phone]   [facility] [place], [place] [address] [phone]   [place] [address] [place], [place] [phone]   [facility]    [phone]   [facility]    [phone]   [place] and [address], [place] Phone:  [phone], Fax:  [phone] Hours of Operation:  9 am - 6 pm, M-F.  Also accepts  Medicaid/Medicare and self-pay.  [facility] for [place], [address], [place] Phone: [phone], Fax: [phone]. Hours of Operation:  8:30 am - 5:30 pm, M-F.  Also accepts Medicaid and self-pay.  [facility] [place] [address], [place] Phone: [phone]   [place], [place], [place] [phone], Ext. [phone] Mondays & Thursdays: 7-9 AM.  First 15 patients are seen on a first come, first serve basis.    [place] Providers:  Organization         Address  Phone   Notes  [facility] [address],  [phone] Also accepts self-pay patients.  [facility] [address], [place]  [phone]   [place], [address], [place] [phone]   [facility] [address], [place] [phone]   Lucianne Lei [address], [place]   [phone] Only accepts [place] patients after they have their name applied to their card.   Self-Pay (no insurance) in [facility]:  Organization         Address  Phone   Notes  Sickle Cell Patients, [facility] [place] [phone]   [facility] [place] [phone]   [facility] [place]  [place], [place], [place] [phone]   [facility]/Dr. Osei-Bonsu  [address], [place] or [address], [place] [phone] Phone number for both [place] and [place] locations is the same.  [facility] and [facility] [address] [place] [phone]   [facility] [address] [place] or [address] [phone]   [facility] [address], [place] [phone], phone; [phone], fax Sees patients 1st and 3rd Saturday of every month.  Must not qualify for public or private insurance (i.e. Medicaid, Medicare, [place] Health Choice, Veterans' Benefits)  Household income should be no more than 200% of the poverty level The clinic cannot treat you if you are pregnant or think you are pregnant  Sexually transmitted diseases are not treated at the clinic.

## 2018-09-19 ENCOUNTER — Other Ambulatory Visit: Payer: Self-pay | Admitting: Family Medicine

## 2018-09-22 ENCOUNTER — Telehealth: Payer: Self-pay | Admitting: Family Medicine

## 2018-09-22 ENCOUNTER — Other Ambulatory Visit: Payer: Self-pay

## 2018-09-22 MED ORDER — QUETIAPINE FUMARATE 400 MG PO TABS: ORAL_TABLET | ORAL | 4 refills | Status: DC

## 2018-09-22 NOTE — Telephone Encounter (Signed)
Medication Refill - Medication: QUEtiapine (SEROQUEL) 400 MG tablet [[id number]]    Has the patient contacted their pharmacy? No. (Agent: If no, request that the patient contact the pharmacy for the refill.) The patient is completely out of this medication  Preferred Pharmacy (with phone number or street name): [id number] [place], [place] [phone] (Phone) [phone] (Fax)      dvised that RX refills may take up to 3 business days. We ask that you follow-up with your pharmacy.

## 2018-09-22 NOTE — Telephone Encounter (Signed)
See note

## 2018-09-22 NOTE — Telephone Encounter (Signed)
Rx sent to pharmacy   

## 2018-09-23 ENCOUNTER — Other Ambulatory Visit: Payer: Self-pay | Admitting: Family Medicine

## 2018-09-24 DIAGNOSIS — Z1159 Encounter for screening for other viral diseases: Secondary | ICD-10-CM | POA: Diagnosis not present

## 2018-09-24 DIAGNOSIS — Z79899 Other long term (current) drug therapy: Secondary | ICD-10-CM | POA: Diagnosis not present

## 2018-10-07 NOTE — Telephone Encounter (Signed)
Left message to call Kaitlyn at [phone]. 

## 2018-10-07 NOTE — Telephone Encounter (Signed)
Patient returning call.

## 2018-10-07 NOTE — Telephone Encounter (Signed)
Spoke with patient. PUS with possible EMB rescheduled to 7/28 at 2pm, consult to follow at 2:30pm with Dr. Talbert Nan.   Routing to provider for final review. Patient is agreeable to disposition. Will close encounter.  Cc: Lerry Liner, Magdalene Patricia

## 2018-10-08 DIAGNOSIS — R030 Elevated blood-pressure reading, without diagnosis of hypertension: Secondary | ICD-10-CM | POA: Diagnosis not present

## 2018-10-08 DIAGNOSIS — M4150 Other secondary scoliosis, site unspecified: Secondary | ICD-10-CM | POA: Diagnosis not present

## 2018-10-08 DIAGNOSIS — Z6828 Body mass index (BMI) 28.0-28.9, adult: Secondary | ICD-10-CM | POA: Diagnosis not present

## 2018-10-08 DIAGNOSIS — M438X9 Other specified deforming dorsopathies, site unspecified: Secondary | ICD-10-CM | POA: Diagnosis not present

## 2018-10-08 DIAGNOSIS — M5441 Lumbago with sciatica, right side: Secondary | ICD-10-CM | POA: Diagnosis not present

## 2018-10-09 NOTE — Progress Notes (Signed)
Patient didn't show for her visit

## 2018-10-14 ENCOUNTER — Other Ambulatory Visit: Payer: Medicare Other

## 2018-10-14 ENCOUNTER — Telehealth: Payer: Self-pay | Admitting: Obstetrics and Gynecology

## 2018-10-14 ENCOUNTER — Ambulatory Visit (INDEPENDENT_AMBULATORY_CARE_PROVIDER_SITE_OTHER): Payer: Medicare Other | Admitting: Obstetrics and Gynecology

## 2018-10-14 ENCOUNTER — Other Ambulatory Visit: Payer: Self-pay

## 2018-10-14 DIAGNOSIS — N950 Postmenopausal bleeding: Secondary | ICD-10-CM

## 2018-10-14 NOTE — Telephone Encounter (Signed)
Routed to [employer]. by accident.

## 2018-10-14 NOTE — Telephone Encounter (Signed)
Patient missed her PUS possible EMB today. She said "I am at the mechanics with my car, I am no sorry, I forgot the appointment.. She would like to reschedule.

## 2018-10-15 NOTE — Telephone Encounter (Signed)
Call placed to patient to reschedule ultrasound appointment that she cancelled on 10/14/2018. Left voicemail message requesting a return call

## 2018-10-15 NOTE — Telephone Encounter (Signed)
Left message to call Jahbari Repinski, RN at [facility] [phone].   

## 2018-10-22 NOTE — Telephone Encounter (Signed)
Second call placed to patient to reschedule ultrasound appointment. Left voicemail message requesting a return call

## 2018-10-23 ENCOUNTER — Telehealth: Payer: Self-pay | Admitting: Family Medicine

## 2018-10-23 NOTE — Telephone Encounter (Signed)
See below

## 2018-10-23 NOTE — Telephone Encounter (Signed)
Relation to pt: self  Call back number: [phone]  [id number] [place], [place] [phone] (Phone) [phone] (Fax)     Reason for call:  Patient requesting baclofen (LIORESAL) 10 MG tablet, informed patient please allow 48 to 72 hour turn around time, please advise

## 2018-10-24 ENCOUNTER — Other Ambulatory Visit: Payer: Self-pay

## 2018-10-24 MED ORDER — BACLOFEN 10 MG PO TABS: 10.0000 mg | ORAL_TABLET | Freq: Three times a day (TID) | ORAL | 0 refills | Status: AC

## 2018-10-24 NOTE — Telephone Encounter (Signed)
Rx sent 

## 2018-10-29 NOTE — Telephone Encounter (Signed)
Gay Filler, Can you please send the patient a letter stressing the importance of evaluating PMP bleeding and recommendation for ultrasound and possible endometrial biopsy.   CC: Lerry Liner

## 2018-10-29 NOTE — Telephone Encounter (Signed)
See previous phone notes. Patient was scheduled for an ultrasound on 10/14/2018, but cancelled. Multiple calls have been placed to patient to rescheduled recommended ultrasound. To date patient has not returned calls.  Please advise how to proceed with order   Routing to Dr Talbert Nan  cc: Lamont Snowball, RN

## 2018-11-09 ENCOUNTER — Other Ambulatory Visit: Payer: Self-pay | Admitting: Family Medicine

## 2018-11-13 ENCOUNTER — Other Ambulatory Visit: Payer: Self-pay | Admitting: Family Medicine

## 2018-11-13 MED ORDER — QUETIAPINE FUMARATE 400 MG PO TABS: ORAL_TABLET | ORAL | 4 refills | Status: DC

## 2018-11-13 NOTE — Telephone Encounter (Signed)
Requested medication (s) are due for refill today: yes Requested medication (s) are on the active medication list: yes Last refill:  09/2018 Future visit scheduled: no  Notes to clinic: This refill cannot be delegated  Requested Prescriptions  Pending Prescriptions Disp Refills   QUEtiapine (SEROQUEL) 400 MG tablet 45 tablet 4    Sig: TAKE 1/2 TABLET (200 MG TOTAL) BY MOUTH AT BEDTIME.     Not Delegated - Psychiatry:  Antipsychotics - Second Generation (Atypical) - quetiapine Failed - 11/13/2018  2:18 PM      Failed - This refill cannot be delegated      Failed - Last BP in normal range    BP Readings from Last 1 Encounters:  09/17/18 (!) 140/106         Passed - ALT in normal range and within 180 days    ALT  Date Value Ref Range Status  09/17/2018 32 0 - 44 U/L Final         Passed - AST in normal range and within 180 days    AST  Date Value Ref Range Status  09/17/2018 40 15 - 41 U/L Final         Passed - Valid encounter within last 6 months    Recent Outpatient Visits          2 months ago Cough   [place] at [employer], Alinda Sierras, MD   3 months ago Hill, MD   5 months ago [facility] Parker, Algis Greenhouse, MD   6 months ago Edema, unspecified type   [place], [place], [place]   7 months ago Seizure-like activity [facility])   [facility], Algis Greenhouse, MD      Future Appointments            In 3 weeks Michel Bickers, MD [facility], [facility]           Passed - Completed PHQ-2 or PHQ-9 in the last 360 days.

## 2018-11-13 NOTE — Telephone Encounter (Signed)
Medication Refill - Medication: QUEtiapine (SEROQUEL) 400 MG tablet  Has the patient contacted their pharmacy? Yes - pharmacy is telling her she can't get until mid September, but she is completely out (Agent: If no, request that the patient contact the pharmacy for the refill.) (Agent: If yes, when and what did the pharmacy advise?)  Preferred Pharmacy (with phone number or street name):  [id number] , [place] [phone] (Phone) [phone] (Fax)   Agent: Please be advised that RX refills may take up to 3 business days. We ask that you follow-up with your pharmacy.

## 2018-11-13 NOTE — Telephone Encounter (Signed)
See note

## 2018-11-19 NOTE — Telephone Encounter (Signed)
Follow-up call to patient regarding pelvic ultrasound. Per DPR, can leave message on voice mail.  Requested call back to [facility], Buyer, retail, with update on status and plans for scheduling.

## 2018-12-10 ENCOUNTER — Telehealth: Payer: Self-pay | Admitting: Pharmacy Technician

## 2018-12-10 ENCOUNTER — Ambulatory Visit: Payer: Medicare Other | Admitting: Internal Medicine

## 2018-12-10 NOTE — Telephone Encounter (Signed)
[facility] Patient Advocate Encounter    Findings of the benefits investigation conducted this morning via test claims for the patient's upcoming appointment on 09/23 are as follows:   Insurance: [employer] Medicare Part D and [place] Medicaid- active  Prior Authorization: will begin insurance process once medication is prescribed. Insurance wants brand  [facility] Patient Advocate will follow up once patient arrives for their appointment.  Bartholomew Crews, CPhT Specialty Pharmacy Patient [facility] Phone: [phone] Fax: [phone] 12/10/2018 8:11 AM

## 2018-12-22 ENCOUNTER — Other Ambulatory Visit: Payer: Self-pay

## 2018-12-22 ENCOUNTER — Encounter (HOSPITAL_COMMUNITY): Payer: Self-pay

## 2018-12-22 ENCOUNTER — Ambulatory Visit (HOSPITAL_COMMUNITY)
Admission: EM | Admit: 2018-12-22 | Discharge: 2018-12-22 | Disposition: A | Discharge status: Home / Self Care | Payer: Medicare Other | Attending: Family Medicine | Admitting: Family Medicine

## 2018-12-22 DIAGNOSIS — Z20828 Contact with and (suspected) exposure to other viral communicable diseases: Secondary | ICD-10-CM | POA: Diagnosis not present

## 2018-12-22 DIAGNOSIS — Z20822 Contact with and (suspected) exposure to covid-19: Secondary | ICD-10-CM

## 2018-12-22 DIAGNOSIS — R05 Cough: Secondary | ICD-10-CM | POA: Insufficient documentation

## 2018-12-22 DIAGNOSIS — R059 Cough, unspecified: Secondary | ICD-10-CM

## 2018-12-22 NOTE — ED Provider Notes (Signed)
[place]    CSN: [id number] Arrival date & time: 12/22/18  1910      History   Chief Complaint Chief Complaint  Patient presents with  . Cough    HPI Laura [name] is a 48 y.o. female.   HPI  Patient states she is had a cough for 2 days.  She describes it as "phlegmy".  She is coughing up some thick mucus.  No fever.  No shortness of breath.  No known exposure to coronavirus.  She is interested in coronavirus testing.  No headache.  No body aches.  No sweats chills or fever.  She has normal sense of taste and smell.  No nausea vomiting or diarrhea.  She is a smoker.  Past Medical History:  Diagnosis Date  . Anxiety   . Depression   . ETOH abuse   . Fibroid   . Hepatitis C   . PTSD (post-traumatic stress disorder)    from the death of her son  . Sebaceous cyst 03/20/2018  . Substance abuse ([place])   . Suicide attempt by drug overdose ([place]) 02/02/2018  . Urinary incontinence 2019    Patient Active Problem List   Diagnosis Date Noted  . B12 deficiency 06/09/2018  . Alopecia 06/05/2018  . 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 Seizure-like activity ([place]) 04/02/2018  . Anxiety 03/20/2018  . Cigarette smoker 05/28/2017  . IV Drug addiction ([facility]) 05/27/2017  . Uterine leiomyoma 04/18/2017  . Chronic hepatitis C without hepatic coma ([place]) 03/29/2017  . 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 Insomnia 03/29/2017  . PTSD (post-traumatic stress disorder) 08/12/2016  . History of suicide attempt   . Alcohol use disorder, severe, dependence ([place]) 07/23/2016  . MDD (major depressive disorder), recurrent severe, without psychosis ([place]) 07/21/2016    Past Surgical History:  Procedure Laterality Date  . APPENDECTOMY    . BACK SURGERY     x5--disabled  . KNEE SURGERY Right     OB History    Gravida  2   Para  2   Term      Preterm      AB      Living  1     SAB      TAB      Ectopic      Multiple      Live Births           Obstetric Comments  1 son deceased--drowning         Home Medications    Prior to Admission medications   Medication Sig Start Date End Date Taking? Authorizing Provider  baclofen (LIORESAL) 10 MG tablet Take 1 tablet (10 mg total) by mouth 3 (three) times daily. 10/24/18   Vivi Barrack, MD  Buprenorphine HCl-Naloxone HCl 8-2 MG FILM Take 1 Film by mouth 3 (three) times daily. 08/13/18   [provider]  doxycycline (VIBRAMYCIN) 100 MG capsule Take 1 capsule (100 mg total) by mouth 2 (two) times daily. 09/10/18   Burchette, Alinda Sierras, MD  gabapentin (NEURONTIN) 300 MG capsule Take 300 mg by mouth 3 (three) times daily.  04/17/18   [provider]  QUEtiapine (SEROQUEL) 400 MG tablet TAKE 1/2 TABLET (200 MG TOTAL) BY MOUTH AT BEDTIME. 11/13/18   Vivi Barrack, MD  traZODone (DESYREL) 100 MG tablet TAKE 1 TABLET (100 MG TOTAL) BY MOUTH AT BEDTIME AS NEEDED FOR SLEEP. FOR SLEEP 11/10/18  Vivi Barrack, MD  albuterol (PROVENTIL HFA;VENTOLIN HFA) 108 (90 Base) MCG/ACT inhaler Inhale 2 puffs into the lungs every 6 (six) hours as needed for wheezing or shortness of breath. Patient not taking: Reported on 09/03/2018 04/21/18 12/22/18  Nita Sells, MD    Family History Family History  Family history unknown: Yes    Social History Social History   Tobacco Use  . Smoking status: Current Every Day Smoker    Packs/day: 0.50    Years: 20.00    Pack years: 10.00    Types: Cigarettes  . Smokeless tobacco: Never Used  Substance Use Topics  . Alcohol use: Not Currently  . Drug use: Not Currently     Allergies   Patient has no known allergies.   Review of Systems Review of Systems  Constitutional: Negative for chills, fatigue and fever.  HENT: Negative for ear pain and sore throat.   Eyes: Negative for pain and visual disturbance.  Respiratory: Positive for cough. Negative for shortness of breath.   Cardiovascular:  Negative for chest pain and palpitations.  Gastrointestinal: Negative for abdominal pain and vomiting.  Genitourinary: Negative for dysuria and hematuria.  Musculoskeletal: Negative for arthralgias and back pain.  Skin: Negative for color change and rash.  Neurological: Negative for seizures, syncope and headaches.  All other systems reviewed and are negative.    Physical Exam Triage Vital Signs ED Triage Vitals  Enc Vitals Group     BP 12/22/18 2004 114/82     Pulse Rate 12/22/18 2004 93     Resp 12/22/18 2004 17     Temp 12/22/18 2004 99.2 F (37.3 C)     Temp Source 12/22/18 2004 Tympanic     SpO2 12/22/18 2004 95 %     Weight 12/22/18 2003 170 lb (77.1 kg)     Height --      Head Circumference --      Peak Flow --      Pain Score 12/22/18 2003 0     Pain Loc --      Pain Edu? --      Excl. in [place]? --    No data found.  Updated Vital Signs BP 114/82 (BP Location: Left Arm)   Pulse 93   Temp 99.2 F (37.3 C) (Tympanic)   Resp 17   Wt 77.1 kg   SpO2 95%   BMI 26.63 kg/m       Physical Exam Constitutional:      General: She is not in acute distress.    Appearance: She is well-developed.  HENT:     Head: Normocephalic and atraumatic.  Eyes:     Conjunctiva/sclera: Conjunctivae normal.     Pupils: Pupils are equal, round, and reactive to light.  Neck:     Musculoskeletal: Normal range of motion.  Cardiovascular:     Rate and Rhythm: Normal rate and regular rhythm.     Heart sounds: Normal heart sounds.  Pulmonary:     Effort: Pulmonary effort is normal. No respiratory distress.     Breath sounds: Normal breath sounds.     Comments: Lungs are clear Abdominal:     General: There is no distension.     Palpations: Abdomen is soft.  Musculoskeletal: Normal range of motion.  Skin:    General: Skin is warm and dry.  Neurological:     Mental Status: She is alert.      UC Treatments / Results  Labs (all labs ordered are listed,  but only abnormal  results are displayed) Labs Reviewed  NOVEL CORONAVIRUS, NAA (HOSP ORDER, SEND-OUT TO REF LAB; TAT 18-24 HRS)    EKG   Radiology No results found.  Procedures Procedures (including critical care time)  Medications Ordered in UC Medications - No data to display  Initial Impression / Assessment and Plan / UC Course  I have reviewed the triage vital signs and the nursing notes.  Pertinent labs & imaging results that were available during my care of the patient were reviewed by me and considered in my medical decision making (see chart for details).     Discussed that was just 3 days of illness, it was not indicated to treat her with antibiotics.  She felt this is what she needed.  Recommend over-the-counter medications.  Coronavirus testing.  Discussed quarantine. Final Clinical Impressions(s) / UC Diagnoses   Final diagnoses:  Cough  Suspected COVID-19 virus infection     Discharge Instructions     Tylenol for pain and fever Mucinex DM for the cough and the phlegm Push fluids We will call you if your COVID test is positive Quarantine at home until you get test results    Person Under Monitoring Name: Laura [name]  Location: [place] [address]   Infection Prevention Recommendations for Individuals Confirmed to have, or Being Evaluated for, 2019 Novel Coronavirus (COVID-19) Infection Who Receive Care at Home  Individuals who are confirmed to have, or are being evaluated for, COVID-19 should follow the prevention steps below until a healthcare provider or local or state health department says they can return to normal activities.  Stay home except to get medical care You should restrict activities outside your home, except for getting medical care. Do not go to work, school, or public areas, and do not use public transportation or taxis.  Call ahead before visiting your doctor Before your medical appointment, call the  healthcare provider and tell them that you have, or are being evaluated for, COVID-19 infection. This will help the healthcare provider's office take steps to keep other people from getting infected. Ask your healthcare provider to call the local or state health department.  Monitor your symptoms Seek prompt medical attention if your illness is worsening (e.g., difficulty breathing). Before going to your medical appointment, call the healthcare provider and tell them that you have, or are being evaluated for, COVID-19 infection. Ask your healthcare provider to call the local or state health department.  Wear a facemask You should wear a facemask that covers your nose and mouth when you are in the same room with other people and when you visit a healthcare provider. People who live with or visit you should also wear a facemask while they are in the same room with you.  Separate yourself from other people in your home As much as possible, you should stay in a different room from other people in your home. Also, you should use a separate bathroom, if available.  Avoid sharing household items You should not share dishes, drinking glasses, cups, eating utensils, towels, bedding, or other items with other people in your home. After using these items, you should wash them thoroughly with soap and water.  Cover your coughs and sneezes Cover your mouth and nose with a tissue when you cough or sneeze, or you can cough or sneeze into your sleeve. Throw used tissues in a lined trash can, and immediately wash your hands with soap and water  for at least 20 seconds or use an alcohol-based hand rub.  Wash your [facility] your hands often and thoroughly with soap and water for at least 20 seconds. You can use an alcohol-based hand sanitizer if soap and water are not available and if your hands are not visibly dirty. Avoid touching your eyes, nose, and mouth with unwashed hands.   Prevention Steps for  Caregivers and Household Members of Individuals Confirmed to have, or Being Evaluated for, COVID-19 Infection Being Cared for in the Home  If you live with, or provide care at home for, a person confirmed to have, or being evaluated for, COVID-19 infection please follow these guidelines to prevent infection:  Follow healthcare provider's instructions Make sure that you understand and can help the patient follow any healthcare provider instructions for all care.  Provide for the patient's basic needs You should help the patient with basic needs in the home and provide support for getting groceries, prescriptions, and other personal needs.  Monitor the patient's symptoms If they are getting sicker, call his or her medical provider and tell them that the patient has, or is being evaluated for, COVID-19 infection. This will help the healthcare provider's office take steps to keep other people from getting infected. Ask the healthcare provider to call the local or state health department.  Limit the number of people who have contact with the patient  If possible, have only one caregiver for the patient.  Other household members should stay in another home or place of residence. If this is not possible, they should stay  in another room, or be separated from the patient as much as possible. Use a separate bathroom, if available.  Restrict visitors who do not have an essential need to be in the home.  Keep older adults, very young children, and other sick people away from the patient Keep older adults, very young children, and those who have compromised immune systems or chronic health conditions away from the patient. This includes people with chronic heart, lung, or kidney conditions, diabetes, and cancer.  Ensure good ventilation Make sure that shared spaces in the home have good air flow, such as from an air conditioner or an opened window, weather permitting.  Wash your hands often   Wash your hands often and thoroughly with soap and water for at least 20 seconds. You can use an alcohol based hand sanitizer if soap and water are not available and if your hands are not visibly dirty.  Avoid touching your eyes, nose, and mouth with unwashed hands.  Use disposable paper towels to dry your hands. If not available, use dedicated cloth towels and replace them when they become wet.  Wear a facemask and gloves  Wear a disposable facemask at all times in the room and gloves when you touch or have contact with the patient's blood, body fluids, and/or secretions or excretions, such as sweat, saliva, sputum, nasal mucus, vomit, urine, or feces.  Ensure the mask fits over your nose and mouth tightly, and do not touch it during use.  Throw out disposable facemasks and gloves after using them. Do not reuse.  Wash your hands immediately after removing your facemask and gloves.  If your personal clothing becomes contaminated, carefully remove clothing and launder. Wash your hands after handling contaminated clothing.  Place all used disposable facemasks, gloves, and other waste in a lined container before disposing them with other household waste.  Remove gloves and wash your hands immediately after handling  these items.  Do not share dishes, glasses, or other household items with the patient  Avoid sharing household items. You should not share dishes, drinking glasses, cups, eating utensils, towels, bedding, or other items with a patient who is confirmed to have, or being evaluated for, COVID-19 infection.  After the person uses these items, you should wash them thoroughly with soap and water.  Wash laundry thoroughly  Immediately remove and wash clothes or bedding that have blood, body fluids, and/or secretions or excretions, such as sweat, saliva, sputum, nasal mucus, vomit, urine, or feces, on them.  Wear gloves when handling laundry from the patient.  Read and follow  directions on labels of laundry or clothing items and detergent. In general, wash and dry with the warmest temperatures recommended on the label.  Clean all areas the individual has used often  Clean all touchable surfaces, such as counters, tabletops, doorknobs, bathroom fixtures, toilets, phones, keyboards, tablets, and bedside tables, every day. Also, clean any surfaces that may have blood, body fluids, and/or secretions or excretions on them.  Wear gloves when cleaning surfaces the patient has come in contact with.  Use a diluted bleach solution (e.g., dilute bleach with 1 part bleach and 10 parts water) or a household disinfectant with a label that says EPA-registered for coronaviruses. To make a bleach solution at home, add 1 tablespoon of bleach to 1 quart (4 cups) of water. For a larger supply, add  cup of bleach to 1 gallon (16 cups) of water.  Read labels of cleaning products and follow recommendations provided on product labels. Labels contain instructions for safe and effective use of the cleaning product including precautions you should take when applying the product, such as wearing gloves or eye protection and making sure you have good ventilation during use of the product.  Remove gloves and wash hands immediately after cleaning.  Monitor yourself for signs and symptoms of illness Caregivers and household members are considered close contacts, should monitor their health, and will be asked to limit movement outside of the home to the extent possible. Follow the monitoring steps for close contacts listed on the symptom monitoring form.   ? If you have additional questions, contact your local health department or call the epidemiologist on call at [phone] (available 24/7). ? This guidance is subject to change. For the most up-to-date guidance from [facility], please refer to their website: [email or web address]    ED  Prescriptions    None     PDMP not reviewed this encounter.   Raylene Everts, MD 12/22/18 [phone]

## 2018-12-22 NOTE — ED Triage Notes (Signed)
Pt states she has a cough x 2 days.

## 2018-12-22 NOTE — Discharge Instructions (Signed)
Tylenol for pain and fever Mucinex DM for the cough and the phlegm Push fluids We will call you if your COVID test is positive Quarantine at home until you get test results    Person Under Monitoring Name: Laura [name]  Location: [place] [address]   Infection Prevention Recommendations for Individuals Confirmed to have, or Being Evaluated for, 2019 Novel Coronavirus (COVID-19) Infection Who Receive Care at Home  Individuals who are confirmed to have, or are being evaluated for, COVID-19 should follow the prevention steps below until a healthcare provider or local or state health department says they can return to normal activities.  Stay home except to get medical care You should restrict activities outside your home, except for getting medical care. Do not go to work, school, or public areas, and do not use public transportation or taxis.  Call ahead before visiting your doctor Before your medical appointment, call the healthcare provider and tell them that you have, or are being evaluated for, COVID-19 infection. This will help the healthcare providers office take steps to keep other people from getting infected. Ask your healthcare provider to call the local or state health department.  Monitor your symptoms Seek prompt medical attention if your illness is worsening (e.g., difficulty breathing). Before going to your medical appointment, call the healthcare provider and tell them that you have, or are being evaluated for, COVID-19 infection. Ask your healthcare provider to call the local or state health department.  Wear a facemask You should wear a facemask that covers your nose and mouth when you are in the same room with other people and when you visit a healthcare provider. People who live with or visit you should also wear a facemask while they are in the same room with you.  Separate yourself from other people in your home As  much as possible, you should stay in a different room from other people in your home. Also, you should use a separate bathroom, if available.  Avoid sharing household items You should not share dishes, drinking glasses, cups, eating utensils, towels, bedding, or other items with other people in your home. After using these items, you should wash them thoroughly with soap and water.  Cover your coughs and sneezes Cover your mouth and nose with a tissue when you cough or sneeze, or you can cough or sneeze into your sleeve. Throw used tissues in a lined trash can, and immediately wash your hands with soap and water for at least 20 seconds or use an alcohol-based hand rub.  Wash your [facility] [facility] your hands often and thoroughly with soap and water for at least 20 seconds. You can use an alcohol-based hand sanitizer if soap and water are not available and if your hands are not visibly dirty. Avoid touching your eyes, nose, and mouth with unwashed hands.   Prevention Steps for Caregivers and Household Members of Individuals Confirmed to have, or Being Evaluated for, COVID-19 Infection Being Cared for in the Home  If you live with, or provide care at home for, a person confirmed to have, or being evaluated for, COVID-19 infection please follow these guidelines to prevent infection:  Follow healthcare providers instructions Make sure that you understand and can help the patient follow any healthcare provider instructions for all care.  Provide for the patients basic needs You should help the patient with basic needs in the home and provide support for getting groceries, prescriptions, and other personal  needs.  Monitor the patients symptoms If they are getting sicker, call his or her medical provider and tell them that the patient has, or is being evaluated for, COVID-19 infection. This will help the healthcare providers office take steps to keep other people from getting infected. Ask  the healthcare provider to call the local or state health department.  Limit the number of people who have contact with the patient If possible, have only one caregiver for the patient. Other household members should stay in another home or place of residence. If this is not possible, they should stay in another room, or be separated from the patient as much as possible. Use a separate bathroom, if available. Restrict visitors who do not have an essential need to be in the home.  Keep older adults, very young children, and other sick people away from the patient Keep older adults, very young children, and those who have compromised immune systems or chronic health conditions away from the patient. This includes people with chronic heart, lung, or kidney conditions, diabetes, and cancer.  Ensure good ventilation Make sure that shared spaces in the home have good air flow, such as from an air conditioner or an opened window, weather permitting.  Wash your hands often Wash your hands often and thoroughly with soap and water for at least 20 seconds. You can use an alcohol based hand sanitizer if soap and water are not available and if your hands are not visibly dirty. Avoid touching your eyes, nose, and mouth with unwashed hands. Use disposable paper towels to dry your hands. If not available, use dedicated cloth towels and replace them when they become wet.  Wear a facemask and gloves Wear a disposable facemask at all times in the room and gloves when you touch or have contact with the patients blood, body fluids, and/or secretions or excretions, such as sweat, saliva, sputum, nasal mucus, vomit, urine, or feces.  Ensure the mask fits over your nose and mouth tightly, and do not touch it during use. Throw out disposable facemasks and gloves after using them. Do not reuse. Wash your hands immediately after removing your facemask and gloves. If your personal clothing becomes contaminated,  carefully remove clothing and launder. Wash your hands after handling contaminated clothing. Place all used disposable facemasks, gloves, and other waste in a lined container before disposing them with other household waste. Remove gloves and wash your hands immediately after handling these items.  Do not share dishes, glasses, or other household items with the patient Avoid sharing household items. You should not share dishes, drinking glasses, cups, eating utensils, towels, bedding, or other items with a patient who is confirmed to have, or being evaluated for, COVID-19 infection. After the person uses these items, you should wash them thoroughly with soap and water.  Wash laundry thoroughly Immediately remove and wash clothes or bedding that have blood, body fluids, and/or secretions or excretions, such as sweat, saliva, sputum, nasal mucus, vomit, urine, or feces, on them. Wear gloves when handling laundry from the patient. Read and follow directions on labels of laundry or clothing items and detergent. In general, wash and dry with the warmest temperatures recommended on the label.  Clean all areas the individual has used often Clean all touchable surfaces, such as counters, tabletops, doorknobs, bathroom fixtures, toilets, phones, keyboards, tablets, and bedside tables, every day. Also, clean any surfaces that may have blood, body fluids, and/or secretions or excretions on them. Wear gloves when cleaning surfaces the  patient has come in contact with. Use a diluted bleach solution (e.g., dilute bleach with 1 part bleach and 10 parts water) or a household disinfectant with a label that says EPA-registered for coronaviruses. To make a bleach solution at home, add 1 tablespoon of bleach to 1 quart (4 cups) of water. For a larger supply, add  cup of bleach to 1 gallon (16 cups) of water. Read labels of cleaning products and follow recommendations provided on product labels. Labels contain  instructions for safe and effective use of the cleaning product including precautions you should take when applying the product, such as wearing gloves or eye protection and making sure you have good ventilation during use of the product. Remove gloves and wash hands immediately after cleaning.  Monitor yourself for signs and symptoms of illness Caregivers and household members are considered close contacts, should monitor their health, and will be asked to limit movement outside of the home to the extent possible. Follow the monitoring steps for close contacts listed on the symptom monitoring form.   ? If you have additional questions, contact your local health department or call the epidemiologist on call at [phone] (available 24/7). ? This guidance is subject to change. For the most up-to-date guidance from [facility], please refer to their website: [email or web address]

## 2018-12-24 LAB — NOVEL CORONAVIRUS, NAA (HOSP ORDER, SEND-OUT TO REF LAB; TAT 18-24 HRS): SARS-CoV-2, NAA: NOT DETECTED

## 2018-12-25 DIAGNOSIS — R05 Cough: Secondary | ICD-10-CM | POA: Diagnosis not present

## 2019-01-02 ENCOUNTER — Other Ambulatory Visit: Payer: Self-pay

## 2019-01-02 ENCOUNTER — Telehealth: Payer: Self-pay | Admitting: Family Medicine

## 2019-01-02 DIAGNOSIS — G894 Chronic pain syndrome: Secondary | ICD-10-CM

## 2019-01-02 MED ORDER — METHOCARBAMOL 750 MG PO TABS: 750.0000 mg | ORAL_TABLET | Freq: Three times a day (TID) | ORAL | 0 refills | Status: DC

## 2019-01-02 NOTE — Telephone Encounter (Signed)
Noted  

## 2019-01-02 NOTE — Telephone Encounter (Signed)
Ok to switch to robaxin 750mg  three times daily as needed.  Algis Greenhouse. Jerline Pain, MD 01/02/2019 2:34 PM

## 2019-01-02 NOTE — Telephone Encounter (Signed)
See below

## 2019-01-02 NOTE — Telephone Encounter (Signed)
Please advise 

## 2019-01-02 NOTE — Telephone Encounter (Signed)
Pt called to see if her Rx for baclofen (LIORESAL) 10 MG tablet Can be switched to Robaxin 750MG . This is what was prescribed to her at the treatment center/ please advise

## 2019-01-05 ENCOUNTER — Telehealth: Payer: Self-pay | Admitting: Family Medicine

## 2019-01-05 ENCOUNTER — Other Ambulatory Visit: Payer: Self-pay

## 2019-01-05 DIAGNOSIS — G894 Chronic pain syndrome: Secondary | ICD-10-CM

## 2019-01-05 MED ORDER — METHOCARBAMOL 750 MG PO TABS: 750.0000 mg | ORAL_TABLET | Freq: Three times a day (TID) | ORAL | 0 refills | Status: AC

## 2019-01-05 NOTE — Telephone Encounter (Signed)
Rx was sent to pharmacy. 

## 2019-01-05 NOTE — Telephone Encounter (Signed)
[facility] RECORD [facility] Patient Name: Laura [name] Gender: Female DOB: [birth year] Age: 48 Y 1 M 18 D Return Phone Number: [phone] (Primary) Address: City/State/Zip: [place] [address] Client [facility] Client Site [place] [facility] Night [facility]ician Dimas Chyle- MD Contact Type Call Who Is Calling Patient / Member / Family / Caregiver Call Type Triage / Clinical Relationship To Patient Self Return Phone Number [phone] (Primary) Chief Complaint Back Pain - General Reason for Call Symptomatic / Request for [place] states she experiencing severe back pain and her Rx still has not been filled. Translation No Nurse Assessment Nurse: Tressia Danas, RN, Holly Date/Time (Eastern Time): 01/03/2019 1:01:32 PM Confirm and document reason for call. If symptomatic, describe symptoms. ---Caller states she experiencing severe back pain and her Rx still has not been filled. caller states she has been on Baclafin and it is not better. caller states she was somewhere else for and was on Robaxin and it helped states she has been trying to get this taken care of since last Sunday. caller states her pain is a 9/10. Has the patient had close contact with a person known or suspected to have the novel coronavirus illness OR traveled / lives in area with major community spread (including international travel) in the last 14 days from the onset of symptoms? * If Asymptomatic, screen for exposure and travel within the last 14 days. ---No Does the patient have any new or worsening symptoms? ---Yes Will a triage be completed? ---Yes Related visit to physician within the last 2 weeks? ---Yes Does the PT have any chronic conditions? (i.e. diabetes, asthma, this includes High risk factors for pregnancy, etc.) ---Yes List chronic conditions.  ---bulging disc. lots of back surgeries. Is the patient pregnant or possibly pregnant? (Ask all females between the ages of 37-55) ---No Is this a behavioral health or substance abuse call? ---No PLEASE NOTE: All timestamps contained within this report are represented as [place] Standard Time. CONFIDENTIALTY NOTICE: This fax transmission is intended only for the addressee. It contains information that is legally privileged, confidential or otherwise protected from use or disclosure. If you are not the intended recipient, you are strictly prohibited from reviewing, disclosing, copying using or disseminating any of this information or taking any action in reliance on or regarding this information. If you have received this fax in error, please notify us immediately by telephone so that we can arrange for its return to [place]. Phone: [phone], Toll-Free: [phone], Fax: [phone] Page: 2 of 2 Call Id: [id number] Guidelines Guideline Title Affirmed Question Affirmed Notes Nurse Date/Time Eilene Ghazi Time) Back Pain [1] Pain radiates into the thigh or further down the leg AND [2] both legs McClarnon, RN, [facility] 01/03/2019 1:04:35 PM Disp. Time Eilene Ghazi Time) Disposition Final User 01/03/2019 12:40:39 PM Send To Nurse Ria Comment, RN, April 01/03/2019 1:16:39 PM Pharmacy Call [place] Reason: spoke to pharmacy and medication is there but her insurance will not pay for it. price is $23.14 with good rx coupon. 01/03/2019 1:05:59 PM See HCP within 4 Hours (or PCP triage) Yes McClarnon, RN, [facility] Disagree/Comply Comply Caller Understands Yes PreDisposition Did not know what to do Care Advice Given Per Guideline CARE ADVICE given per Back Pain (Adult) guideline. * You become worse. CALL BACK IF: SEE HCP WITHIN 4 HOURS (OR PCP TRIAGE): Comments User: Dennard Nip, RN Date/Time (Eastern Time): 01/03/2019  1:08:19 PM States she has been dealing with this for 10 years and  has just seen another back dr and they want to do a 6th surgery. caller states she spoke with a nurse User: Dennard Nip, RN Date/Time Eilene Ghazi Time): 01/03/2019 1:15:22 PM caller just would like to have the new medication. states these symptoms are old. Called the pharmacy and the medication is at the pharmacy but her insurance will not cover it. with coupon medication will be 23.14. caller states she does not have that much money. instructed she can call the pharmacy and get a partial fill of the medication and she can work out how many pills she can afford to get at this time until a PA can be obtained for her insurance. caller states understanding Referrals GO TO FACILITY REFUSED GO TO FACILITY REFUSED  PER [facility]

## 2019-01-15 NOTE — Telephone Encounter (Signed)
Standard letter mailed to address on file.   Encounter closed.

## 2019-01-29 ENCOUNTER — Telehealth: Payer: Self-pay | Admitting: Family Medicine

## 2019-01-29 NOTE — Telephone Encounter (Signed)
I called the patient to schedule AWV-Initial with Loma Sousa (health coach).  There was no answer, and the voicemail was full.

## 2019-02-05 ENCOUNTER — Other Ambulatory Visit: Payer: Self-pay

## 2019-02-06 ENCOUNTER — Ambulatory Visit: Payer: Medicare Other | Admitting: Family Medicine

## 2019-02-08 DIAGNOSIS — N912 Amenorrhea, unspecified: Secondary | ICD-10-CM | POA: Diagnosis not present

## 2019-02-08 DIAGNOSIS — N898 Other specified noninflammatory disorders of vagina: Secondary | ICD-10-CM | POA: Diagnosis not present

## 2019-02-08 DIAGNOSIS — N390 Urinary tract infection, site not specified: Secondary | ICD-10-CM | POA: Diagnosis not present

## 2019-02-09 ENCOUNTER — Encounter: Payer: Self-pay | Admitting: Family Medicine

## 2019-04-23 ENCOUNTER — Telehealth: Payer: Self-pay | Admitting: Family Medicine

## 2019-04-23 NOTE — Telephone Encounter (Signed)
I left a message asking the patient to call and schedule Medicare AWV with Courtney ([facility] Health Coach).  If patient calls back, please schedule Medicare Wellness Visit (initial) at next available opening.  VDM (Dee-Dee) 

## 2019-05-05 ENCOUNTER — Encounter: Payer: Medicare Other | Admitting: Family Medicine

## 2019-06-29 ENCOUNTER — Encounter: Payer: Medicare Other | Admitting: Family Medicine

## 2019-06-29 ENCOUNTER — Ambulatory Visit: Payer: Medicare Other

## 2019-08-31 ENCOUNTER — Telehealth: Payer: Self-pay | Admitting: Family Medicine

## 2019-08-31 ENCOUNTER — Other Ambulatory Visit: Payer: Self-pay | Admitting: *Deleted

## 2019-08-31 MED ORDER — QUETIAPINE FUMARATE 400 MG PO TABS: ORAL_TABLET | ORAL | 0 refills | Status: DC

## 2019-08-31 MED ORDER — TRAZODONE HCL 100 MG PO TABS: 100.0000 mg | ORAL_TABLET | Freq: Every evening | ORAL | 0 refills | Status: AC | PRN

## 2019-08-31 NOTE — Telephone Encounter (Signed)
Patient has moved. Sent a medication refill she just needs enough to make it to her new patient appt with her new PCP.

## 2019-08-31 NOTE — Telephone Encounter (Signed)
LAST APPOINTMENT DATE: 07/24/2019   NEXT APPOINTMENT DATE: Visit date not found    LAST REFILL:  QTY:

## 2019-08-31 NOTE — Telephone Encounter (Signed)
[facility]

## 2019-08-31 NOTE — Telephone Encounter (Signed)
Reason for Call Symptomatic / Request for [place] states that she ran out of her Tramadol and Seroquel. She has not been able to sleep in 3 days. She went to a urgent care and they said they are not able to help and for her to call her office. Translation No Nurse Assessment Nurse: Mariea Clonts, RN, Brandi Date/Time (Eastern Time): 08/30/2019 2:52:59 PM Confirm and document reason for call. If symptomatic, describe symptoms. ---Caller states that she ran out of her Trazodone 100 mg, one at night and Seroquel 300 mg one at night. She has not been able to sleep in 3 days. She went to a urgent care and they said they are not able to help and for her to call her office. Has the patient had close contact with a person known or suspected to have the novel coronavirus illness OR traveled / lives in area with major community spread (including international travel) in the last 14 days from the onset of symptoms? * If Asymptomatic, screen for exposure and travel within the last 14 days. ---No Does the patient have any new or worsening symptoms? ---Yes Will a triage be completed? ---Yes Related visit to physician within the last 2 weeks? ---No Does the PT have any chronic conditions? (i.e. diabetes, asthma, this includes High risk factors for pregnancy, etc.) ---No Is the patient pregnant or possibly pregnant? (Ask all females between the ages of 52-55) ---No Is this a behavioral health or substance abuse call? ---No Nurse: Mariea Clonts, RN, Brandi Date/Time (Eastern Time): 08/30/2019 2:57:55 PM Please select the assessment type ---RefillPLEASE NOTE: All timestamps contained within this report are represented as [place] Standard Time. CONFIDENTIALTY NOTICE: This fax transmission is intended only for the addressee. It contains information that is legally privileged, confidential or otherwise protected from use or disclosure. If you are not the intended recipient, you are strictly  prohibited from reviewing, disclosing, copying using or disseminating any of this information or taking any action in reliance on or regarding this information. If you have received this fax in error, please notify us immediately by telephone so that we can arrange for its return to [place]. Phone: [phone], Toll-Free: [phone], Fax: [phone] Page: 2 of 3 Call Id: [id number] Nurse Assessment Additional Documentation ---Needing trazodone 100 mg, one tab at bedtime and Seroquel 300mg  one tab at bedtime Does the patient have enough medication to last until the office opens? ---Unable to obtain loaner dose from Pharmacy Does the client directives allow for assistance with medications after hours? ---Yes Was the medication filled within the last 6 months? ---Yes What is the name of the medication, dose and instructions as listed on the bottle? ---Trazodone 100 mg one tab at bedtime, Seroquel 300mg  one tab at bedtime. Name of the physician as listed on the bottle. ---[facility], [place] name and phone number where most recently filled. [phone] Guidelines Guideline Title Affirmed Question Affirmed Notes Nurse Date/Time Eilene Ghazi Time) Insomnia Requesting medication for sleep ("sleeping pill") Mariea Clonts, RN, Velna Hatchet 08/30/2019 2:55:39 PM Disp. Time Eilene Ghazi Time) Disposition Final User 08/30/2019 3:20:36 PM Pharmacy Call Mariea Clonts, RN, Velna Hatchet Reason: [facility] 08/30/2019 2:56:18 PM Call PCP within 24 Hours Yes Mariea Clonts, RN, Velna Hatchet Caller Disagree/Comply Comply Caller Understands Yes PreDisposition Did not know what to do Care Advice Given Per Guideline CALL PCP WITHIN 24 HOURS: CALL BACK IF: * You become worse. Verbal Orders/Maintenance Medications Medication Refill Route Dosage Regime Duration Admin Instructions User Name Trazodone Yes Oral 100mg  1 Days  Take one tablet at night Mariea Clonts, RN, Brandi Seroquel ER Yes Oral 300 mg 1 Days Take one tab at night Mariea Clonts, Therapist, sports,  Velna Hatchet

## 2019-08-31 NOTE — Telephone Encounter (Signed)
Ok with me. Please place any necessary orders. 

## 2019-08-31 NOTE — Telephone Encounter (Signed)
Please schedule appt

## 2019-08-31 NOTE — Telephone Encounter (Signed)
MEDICATION: QUEtiapine (SEROQUEL) 400 MG tablet traZODone (DESYREL) 100 MG tablet   PH[id number] [place] [place] [facility] [address] Phone:  [phone]  Fax:  [phone]     Comments:   Patient has moved and just needs a month worth till she can see her new PCP.     **Let patient know to contact pharmacy at the end of the day to make sure medication is ready. **  ** Please notify patient to allow 48-72 hours to process**  **Encourage patient to contact the pharmacy for refills or they can request refills through [facility]**

## 2019-09-22 ENCOUNTER — Other Ambulatory Visit: Payer: Self-pay | Admitting: Family Medicine

## 2019-09-24 ENCOUNTER — Telehealth: Payer: Self-pay | Admitting: Family Medicine

## 2019-09-24 NOTE — Telephone Encounter (Signed)
Correction: VM was full. Unable to leave message

## 2019-09-24 NOTE — Telephone Encounter (Signed)
Left message for patient to call back and schedule Medicare Annual Wellness Visit (AWV) either virtually/audio only OR in office. Whatever the patients preference is.  No hx; please schedule at anytime with [facility] at [facility].  This should be a 45 minute visit.   

## 2019-09-26 ENCOUNTER — Other Ambulatory Visit: Payer: Self-pay | Admitting: Family Medicine

## 2019-09-28 NOTE — Telephone Encounter (Signed)
LAST APPOINTMENT DATE: 08/06/2018  NEXT APPOINTMENT DATE: Visit date not found    LAST REFILL:  08/31/2019  QTY:15

## 2019-10-14 ENCOUNTER — Other Ambulatory Visit: Payer: Self-pay | Admitting: Family Medicine

## 2020-03-10 IMAGING — CT CT HEAD W/O CM
3 series · 16 of 47 positions shown, 19 images · non-contrast
Comparison: None

CLINICAL DATA: Recent seizure activity

EXAM:
CT HEAD WITHOUT CONTRAST
TECHNIQUE: Contiguous axial images were obtained from the base of the skull
through the vertex without intravenous contrast.

[Series 2: head wo · axial · 0.42mm/px · z∈[+1645,+1790]mm · 10 of 35 slices shown, 13 images]
[im 3/35  brain]
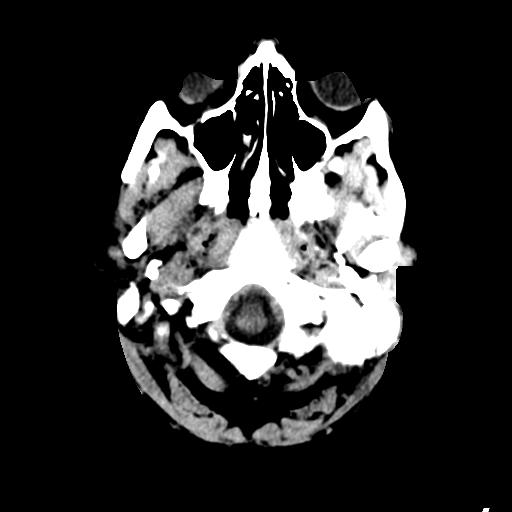
[im 3/35  bone]
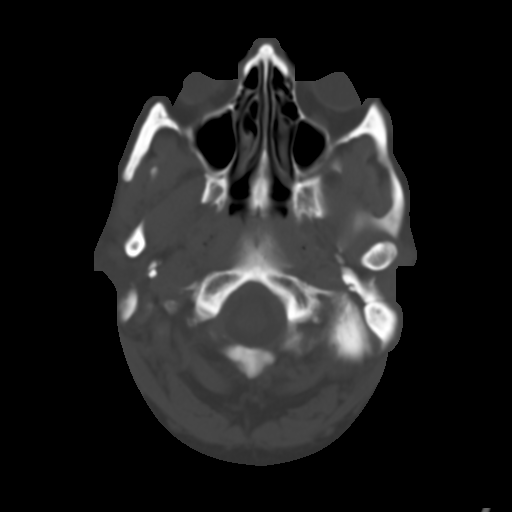
[im 6/35  brain]
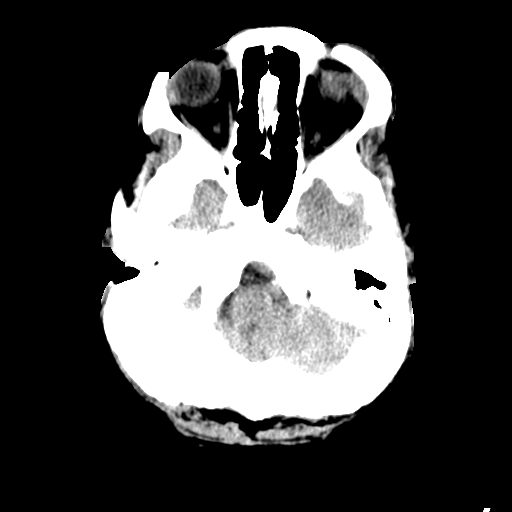
[im 10/35  brain]
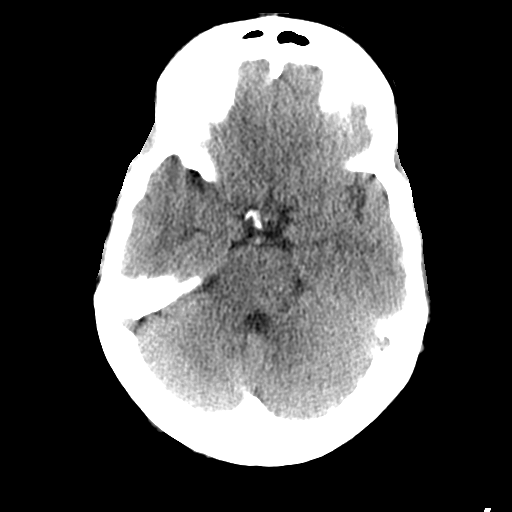
[im 12/35  brain]
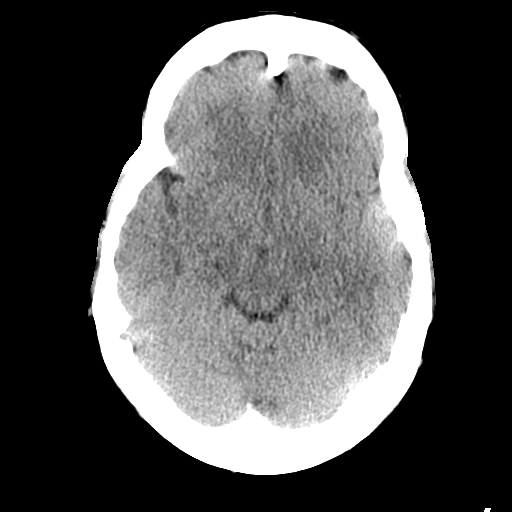
[im 16/35  brain]
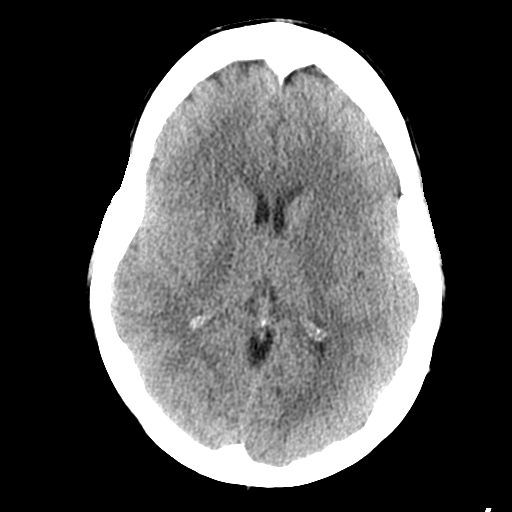
[im 16/35  bone]
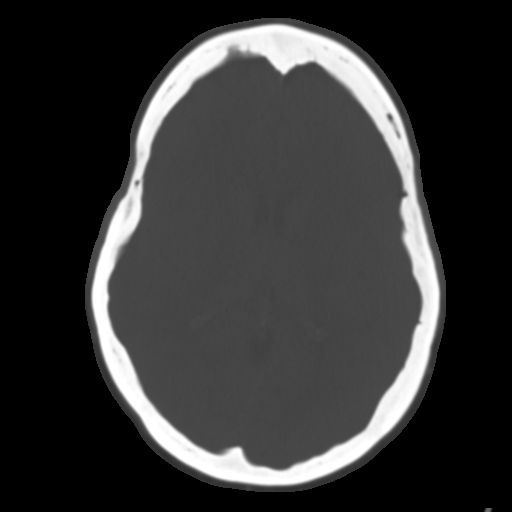
[im 19/35  brain]
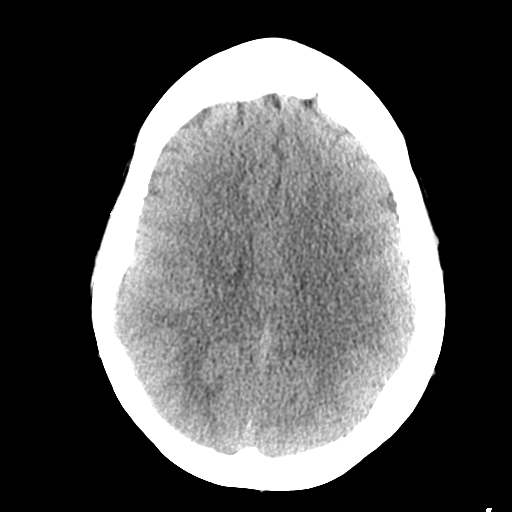
[im 23/35  brain]
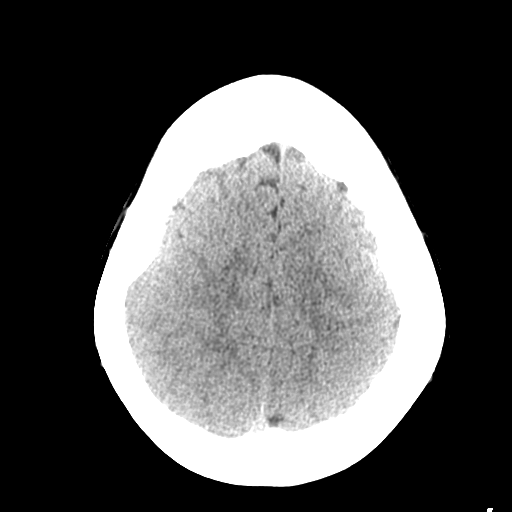
[im 26/35  brain]
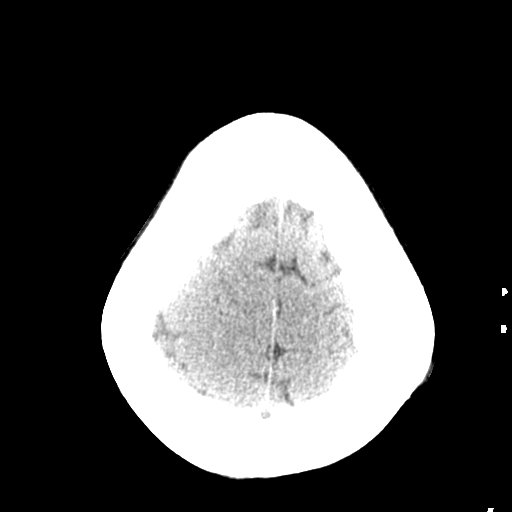
[im 29/35  brain]
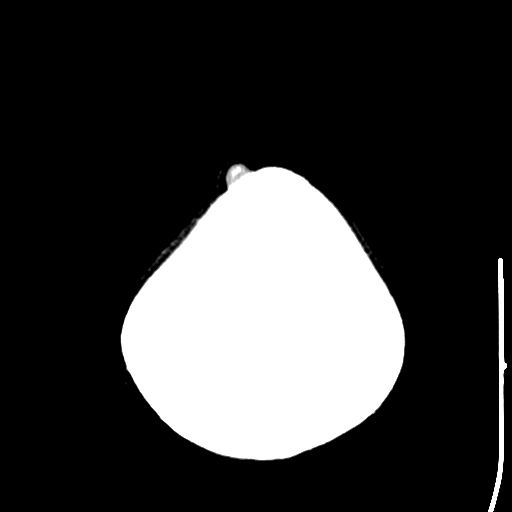
[im 29/35  bone]
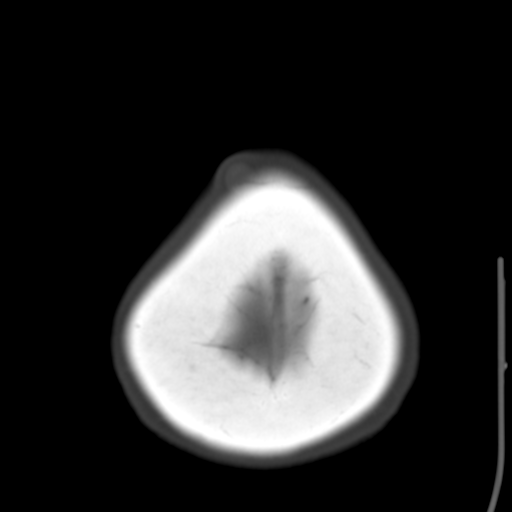
[im 32/35  brain]
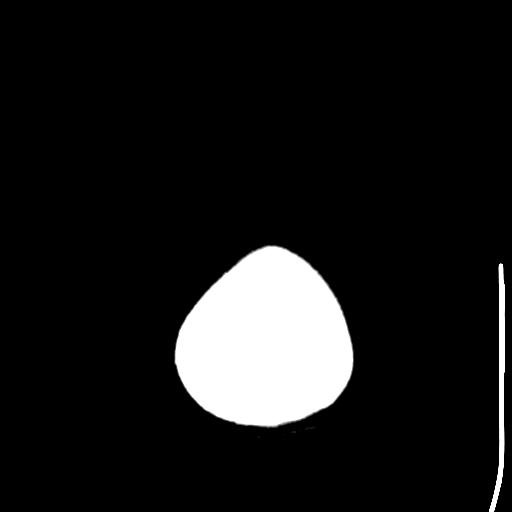

[Series 5: coronal soft tissue · coronal · 0.31mm/px · 3 of 70 slices shown]
[im 24/70  brain]
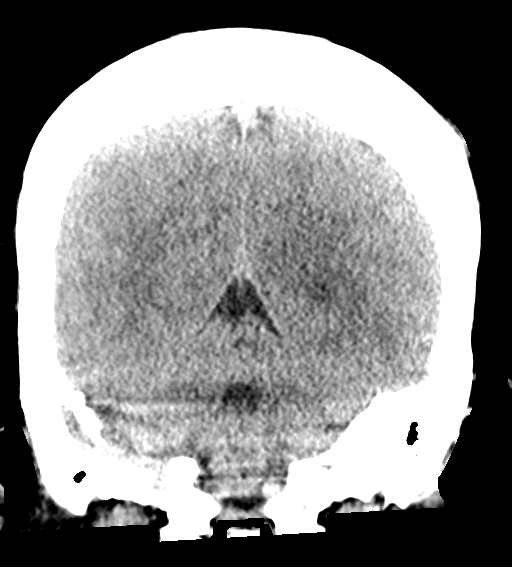
[im 31/70  brain]
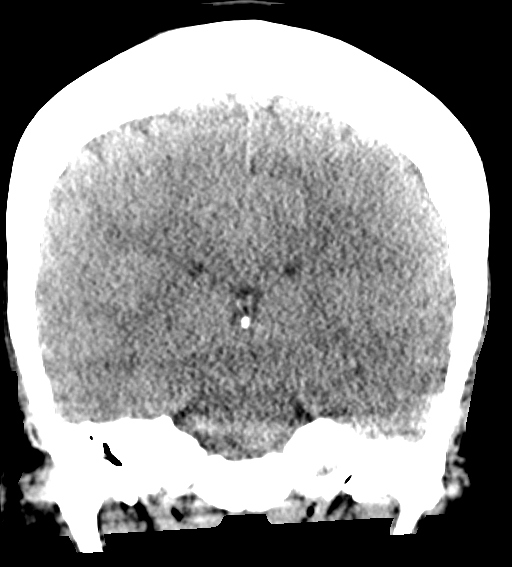
[im 39/70  brain]
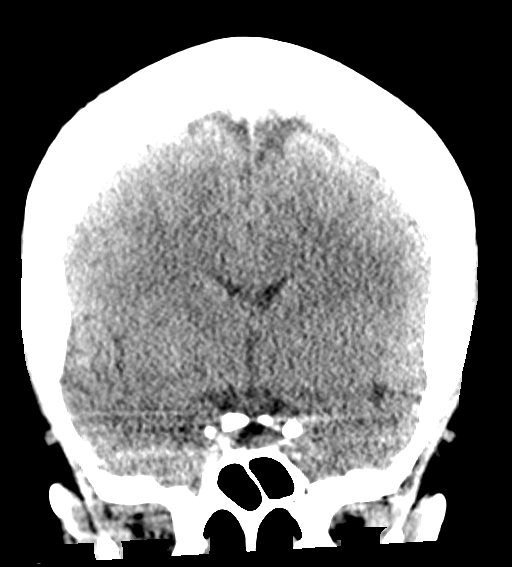

[Series 6: sagittal soft tissue · sagittal · 0.34mm/px · 3 of 53 slices shown]
[im 18/53  brain]
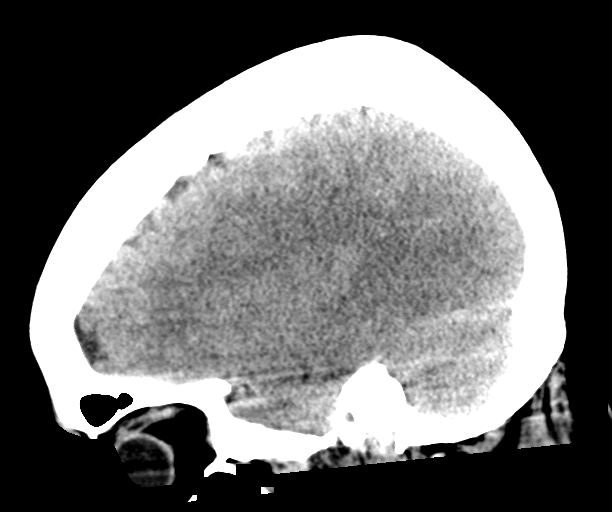
[im 27/53  brain]
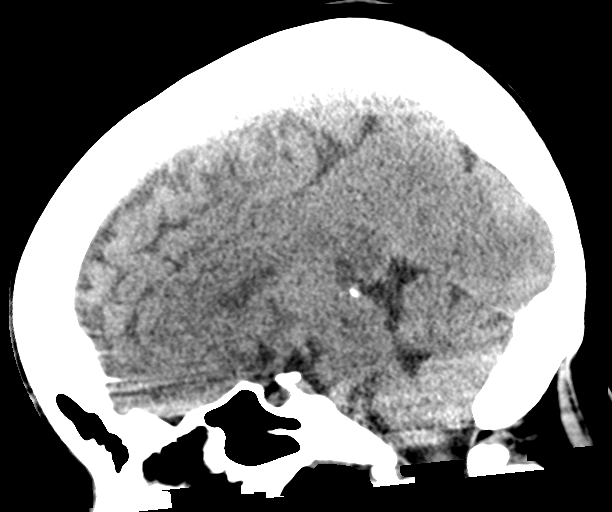
[im 35/53  brain]
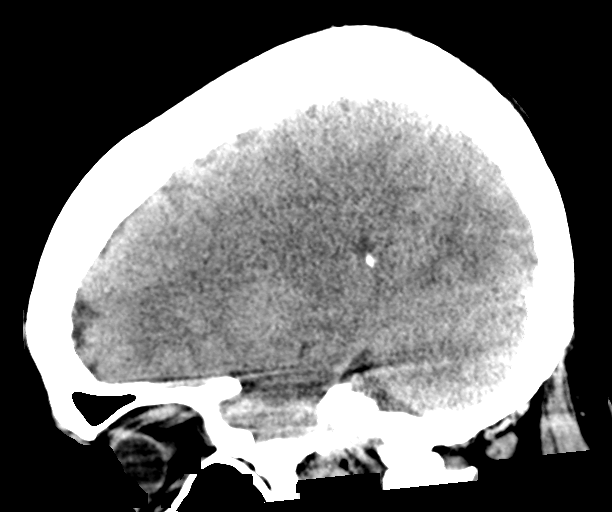

[16 of 47 positions shown; findings below may reference images not displayed]

FINDINGS: Brain: No evidence of acute infarction, hemorrhage, hydrocephalus,
extra-axial collection or mass lesion/mass effect.

Vascular: No hyperdense vessel or unexpected calcification.

Skull: Normal. Negative for fracture or focal lesion.

Sinuses/Orbits: No acute finding.

Other: Calcified subcutaneous nodule is noted to the right of the
midline in frontal scalp.
IMPRESSION: No acute intracranial abnormality noted.

## 2024-04-14 NOTE — Consults (Signed)
 ------------------------------------------------------------------------------- Attestation signed by Oneil Hue, DO at 04/18/24 [id number] OB/GYN ATTENDING ATTESTATION  Laura [name] was seen and evaluated, including the key elements of service. I discussed the findings, assessment and plan as documented below with Laura [name], and agree with the proposed management and treatment plans. My countersignature will demonstrate my concurrence with that documentation.  Oneil Hue, DO 04/18/2024 9:04 AM -------------------------------------------------------------------------------  GYN Consult Name: Laura [name] MRN: [id number] Date of Service: 04/14/2024  CC: Abdominal pain   HPI: Laura [name] is a 54 y.o. [id number] who we are consulted on for postmenopausal pelvic pain and bleeding with increased HCG.  Patient reports that over the last few months, she has had pain in her pelvis. Over the past week, the pain has gotten worse to the point where it was causing nausea and vomiting this morning. It started low in her pelvis but feels like it is higher now. The pain is not better or worse with activity or position. She also reports that she has had some light spotting that started 5 days ago. Her LMP was 2 years ago, and she denies any other interval uterine bleeding.   Patient last saw a gynecologist 10 years ago. She denies any history of abnormal pap smears or cervical procedures. She has been pregnant 6 times. She has had 2 term vaginal deliveries. She is sexually active with 1 female partner. She has no STI concerns and has no hx of STIs. She has an aunt with breast cancer but denies other family history of breast, ovarian, or uterine cancers.   ROS: All other systems reviewed and negative  GYN Hx: - LMP: Unknown   - Menopause: LMP 2 years ago, denies any menstrual bleeding since  - Pap hx: Denies hx of abnormal paps, last pap 10+ years ago, denies hx of excisional procedures - STI hx: Denies  hx of STIs. Is not currently concerned about STIs.  - Sexual preference: sexually active with female partner.  - Contraceptive method: none - HRT: NA - Abdominal surgeries: Denies  - GYN procedures: D&C  OB Hx:  OB History  Gravida Para Term Preterm AB Living  6 2 2  0 4 2  SAB IAB Ectopic Multiple Live Births  0 0 0      # Outcome Date GA Lbr Len/2nd Weight Sex Type Anes PTL Lv  6 AB           5 AB           4 AB           3 AB           2 Term           1 Term             PMH:  Past Medical History:  Diagnosis Date   Liver disease     PSH:  History reviewed. No pertinent surgical history.  Meds:  Prior to Admission medications  Medication Sig Start Date End Date Taking? Authorizing Provider  dicyclomine (BENTYL) 20 mg tablet Take one tablet (20 mg dose) by mouth 2 (two) times daily. 04/14/24   Laura RAMAN. Reavill, MD  ondansetron  (ZOFRAN -ODT) 4 mg disintegrating tablet Take one tablet (4 mg dose) by mouth every 8 (eight) hours as needed for Nausea for up to 7 days. 04/14/24 04/21/24  Laura RAMAN. Delia, MD    Allergies:  Allergies[1]  FH: Denies FHx of colon, endometrial, or ovarian cancer. Has an aunt with  breast cancer.  Family History[2]  SH: Social History[3]  Physical Exam: Vitals:   04/14/24 1559  BP: (!) 139/108  Pulse: 101  Resp: 16  Temp:   SpO2: 97%   Body mass index is 28.89 kg/m.  General: Alert, NAD HEENT: Mucus membranes moist.  CV: RRR Resp: CTAB, no increased work of breathing Abd: BS present, soft, nontender to palpation, no rebound or guarding Pelvic: (chaperone- Laura [name] present) Normal external female genitalia without lesions;  Normal bartholins/skenes glands, normal urethral meatus;  Vaginal mucosa pink and moist with normal rugae, physiologic discharge in the vault;  Cervix is normal without lesions, multiparous in appearance Bimanual exam:     Uterus small, normal shape and contour, tender to light palpation     No  adnexal masses, mild tenderness bilaterally  Anus and perineum are normal in appearance Rectal exam deferred  Skin: No rashes.  Ext: No peripheral edema. Psych: Appropriate affect.   Labs: Lab Results  Component Value Date   WBC 11.4 (H) 04/14/2024   HGB 14.5 04/14/2024   Plt Ct 265 04/14/2024   Creatinine 0.75 04/14/2024   BUN 8 (L) 04/14/2024   Na 140 04/14/2024   Potassium 3.8 04/14/2024   Cl 104 04/14/2024   CO2 25 04/14/2024   ALT 22 04/14/2024   AST 31 04/14/2024   ALK PHOS 133 (H) 04/14/2024   T Bili 0.2 (L) 04/14/2024    Radiology: US  RUQ GALLSTONES WITHOUT EVIDENCE OF CHOLECYSTITIS  INDETERMINATE 2.3 X 2.1 CM HYPOECHOIC MASS MID POLE RIGHT KIDNEY. FOLLOW-UP RENAL MASS PROTOCOL CT RECOMMENDED  US  Ob <14 weeks TA/TV NO INTRAUTERINE GESTATION IS SEEN. BILATERAL OVARIES ARE NOT VISUALIZED. FIBROID UTERUS.  CT Abdomen Pelvis MULTIPLE GALLSTONES. IF THERE IS CONCERN FOR CHOLECYSTITIS, RIGHT UPPER QUADRANT ULTRASOUND RECOMMENDED 1 MM LEFT RENAL STONE. OTHERWISE NEGATIVE   Assessment/Plan: Laura [name] is a 53 y.o. [id number] who we are consulted on for postmenopausal pelvic pain and bleeding with increased HCG.  #Pelvic pain #PMB - Pt reports multiple months of pelvic pain and 5 day history of spotting  - TVUS shows uterine fibroids, otherwise reassuring  - Pelvic & physical exam reassuring - Increased HCG likely false positive secondary to postmenopausal increased LH - PAP smear & G/C collected, recommend endometrial biopsy outpatient. No acute cause of patient's pelvic pain identified   Case was discussed with Dr. Arlyss  Electronically signed: Thedora [name] 04/14/2024 4:19 PM   I personally was present and performed or re-performed the history, physical exam and medical decision making activities of this service and have verified that the service and findings are accurately documented in the student's note.   Laura JINNY Sayre, MD 04/14/2024 4:33 PM         [1] No Known Allergies [2] No family history on file. [3] Social History Socioeconomic History   Marital status: Single  Tobacco Use   Smoking status: Every Day    Current packs/day: 0.50    Types: Cigarettes   Smokeless tobacco: Never  Vaping Use   Vaping status: Never Used  Substance and Sexual Activity   Alcohol use: Not Currently   Drug use: Not Currently  Social History Narrative   ** Merged History Encounter **

## 2024-04-14 NOTE — ED Notes (Addendum)
 Consulted  Leita Cotton. @ 2:53pm Responded @ 2:58pm

## 2024-04-14 NOTE — ED Provider Notes (Signed)
 [facility] EMERGENCY DEPARTMENT ENCOUNTER    CHIEF COMPLAINT   Chief Complaint  Patient presents with   Abdominal Pain    Reports lower abd pain since this morning. Reports elevated hcg levels on 1/25 but states that she does not think she is pregnant. + vaginal spotting, + n/v/d.    HPI   Laura [name] is a 54 y.o. female   History of Present Illness This is a patient with a history of elevated hCG levels presenting with lower abdominal pain.  The patient reports experiencing severe lower abdominal pain that radiates down her legs, causing significant soreness and cramping in her legs, particularly in her foot. The pain began approximately one week ago and has been severe enough to impede her ability to walk. She has visited the emergency room twice this week due to the severity of her symptoms. During these visits, she was informed that her hCG level was elevated. A sonogram was performed, but she is uncertain of the results. A CT scan revealed a growth on her kidney, but no further abnormalities were detected. She reports no pain during urination and no recent infections. She felt slightly warm last night but did not have a fever. She has not had any new sexual partners.  The patient has been experiencing vaginal bleeding for the past week.    The following historical sections were generated from the nursing triage assessment and review.  They were available to review at the time of the patient encounter.  Unless discussed above they were not specifically reviewed with the patient by me for accuracy or completeness. PAST MEDICAL HISTORY   Past Medical History:  Diagnosis Date   Liver disease     SURGICAL HISTORY   History reviewed. No pertinent surgical history.  CURRENT MEDICATIONS   Current Medications[1]  ALLERGIES   Allergies[2]  FAMILY HISTORY   Family History[3]  SOCIAL HISTORY   Social History[4]  Barriers to  care:  REVIEW OF SYSTEMS   Constitutional:     Eyes:   HENT:   Respiratory:  Cardiovascular:  GI:     Musculoskeletal:   Skin:  Neurologic:       See HPI for further details.   PHYSICAL EXAM   VITAL SIGNS: BP (!) 139/108   Pulse 101   Temp 98.1 F (36.7 C) (Oral)   Resp 16   Ht 1.727 m (5' 8)   Wt 86.2 kg (190 lb)   LMP 02/14/2024 (Approximate)   SpO2 97%   BMI 28.89 kg/m   Physical Exam General Appearance: Awake and alert, interactive, appears appropriate.  Vital signs: See nursing documented vitals as above  Eyes: Pupils are equal, no conjunctival injection or discharge noted  HEENT: Normocephalic atraumatic. No notable congestion. Mucous membranes moist  Respiratory: Lungs are clear to auscultation bilaterally, no respiratory distress  Cardiovascular: Regular rate and rhythm, no murmur apparent  Abdominal: Tenderness noted in the lower abdomen no rebound or guarding Skin: Warm and dry, no rash  Neurological: Awake and alert, gross normal cognition. No apparent CN defects, grossly normal strength and sensation throughout   RADIOLOGY   The following radiographs independently reviewed by myself and my preliminary impression as below:  Final Impressions by radiology: No results found for this visit on 04/14/24.  LABS: Labs ordered and results reviewed by myself as documented below: Results for orders placed or performed during the hospital encounter of 04/14/24  CBC And Differential   Collection Time:  04/14/24  1:55 PM  Result Value Ref Range   WBC 11.4 (H) 4.0 - 10.5 10e3cells/uL   RBC 4.93 3.93 - 5.22 10e6cells/uL   HGB 14.5 11.2 - 15.7 gm/dL   HCT 56.8 65.8 - 55.0 %   MCV 87.4 79.4 - 94.8 fL   MCH 29.4 25.6 - 32.2 pg   MCHC 33.6 32.2 - 35.5 gm/dL   Plt Ct 734 849 - 599 10e3cells/uL   RDW SD 42.7 35.1 - 46.3 fL   MPV 10.3 9.4 - 12.4 fL   NRBC% 0.0 0.0 - 0.2 /100WBC   Absolute NRBC Count 0.00 0.00 - 0.01 thou/mcL   NEUTROPHIL % 70.4 %   LYMPHOCYTE %  22.7 %   MONOCYTE % 5.7 %   Eosinophil % 0.4 %   BASOPHIL % 0.5 %   IG% 0.3 %   ABSOLUTE NEUTROPHIL COUNT 8.01 (H) 1.50 - 7.50 thou/mcL   ABSOLUTE LYMPHOCYTE COUNT 2.59 1.00 - 4.50 thou/mcL   Absolute Monocyte Count 0.65 0.10 - 0.80 thou/mcL   Absolute Eosinophil Count 0.05 0.00 - 0.50 thou/mcL   Absolute Basophil Count 0.06 0.00 - 0.20 thou/mcL   Absolute Immature Granulocyte Count 0.03 0.00 - 0.03 thou/mcL   Comprehensive metabolic panel   Collection Time: 04/14/24  1:55 PM  Result Value Ref Range   Na 140 136 - 145 mmol/L   Potassium 3.8 3.4 - 5.1 mmol/L   Cl 104 98 - 107 mmol/L   CO2 25 20 - 31 mmol/L   AGAP 11 mmol/L   Glucose 111 (H) 74 - 106 mg/dL   BUN 8 (L) 9 - 23 mg/dL   Creatinine 9.24 9.44 - 1.02 mg/dL   Ca 9.8 8.7 - 89.5 mg/dL   ALK PHOS 866 (H) 46 - 116 IU/L   T Bili 0.2 (L) 0.3 - 1.2 mg/dL   Total Protein 8.6 (H) 5.7 - 8.2 gm/dL   Alb 4.4 3.4 - 5.0 gm/dL   GLOBULIN 4.2 gm/dL   ALBUMIN/GLOBULIN RATIO 1.0    BUN/CREAT RATIO 11    ALT 22 10 - 49 IU/L   AST 31 <34 IU/L   eGFR >=60 >=60 mL/min/1.91m2  hCG, Serum, Qualitative   Collection Time: 04/14/24  1:55 PM  Result Value Ref Range   Beta HCG Qual Positive (A) Negative  hCG, Serum, QuaNt   Collection Time: 04/14/24  1:55 PM  Result Value Ref Range   B HCG Quant 9 (H) 0 - 4 mIU/mL  Chlamydia trachomatis/Neisseria gonorrhoeae PCR First Void, Urine Urine, First Void   Collection Time: 04/14/24  2:01 PM   Specimen: Urine, First Void; First Void, Urine  Result Value Ref Range   Neisseria gonorrhoeae, PCR (NG) Not Detected Not Detected   Chlamydia trachomatis, PCR Not Detected Not Detected  hCG, Urine, Qualitative   Collection Time: 04/14/24  2:01 PM  Result Value Ref Range   U BETA HCG QUAL Positive (A) Negative  Urinalysis w/ Reflex Microscopic; Reflex to Culture - Symptomatic   Collection Time: 04/14/24  2:01 PM  Result Value Ref Range   Urine Color Yellow Yellow    Urine Clarity Hazy (A) Clear    Urine Specific Gravity 1.025 1.001 - 1.035   Urine pH 5.0 5.0 - 9.0   Urine Protein - Dipstick 30 (A) Negative, Trace mg/dl   Urine Glucose Negative Negative mg/dL   Urine Ketones Trace (A) Negative, 1  mg/dl   Urine Bilirubin Positive (A) Negative mg/dL   Urine Blood Moderate (A)  Negative mg/dL   Urine Urobilinogen 2.0 (A) <2.0 mg/dl   Urine Nitrite Negative Negative   Urine Leukocyte Esterase Negative Negative Leu/mcL   Urine Squamous Epithelial Cells 4 (H) <=2 /HPF   Urine WBC 8 (H) <=2 /HPF   Urine RBC >182 (H) <=2 /HPF   Urine Mucous Few (A) None /HPF   UA Microscopic Yes Micro (A) No Micro    EKG: ECG Results   None     PROCEDURES     ED COURSE & MEDICAL DECISION MAKING   Patient Vitals for the past 24 hrs:  BP Temp Temp src Pulse Resp SpO2 Height Weight  04/14/24 1559 (!) 139/108 -- -- 101 16 97 % -- --  04/14/24 1246 (!) 149/94 98.1 F (36.7 C) Oral 69 16 99 % 1.727 m (5' 8) 86.2 kg (190 lb)    Results Laboratory Studies Positive hCG levels.  Imaging CT scan showed a growth on the kidney.  Assessment & Plan Initial Assessment: Severe lower abdominal pain radiating down legs, accompanied by leg and foot cramping. Postmenopausal with vaginal bleeding and positive hCG levels.  Differential Diagnosis: - Uterine problem: Concern due to postmenopausal status, vaginal bleeding, positive hCG levels. OB/GYN consultation for evaluation. - Uterine cancer: Possible due to symptoms and positive hCG levels. OB/GYN consultation for evaluation.  ED Course: - Fluids administered - Pain medication administered OB/GYN evaluated the patient did not feel that this is attributable to a GYN condition.  Patient has hematuria which is likely secondary to vaginal bleeding.  Patient has had recent right upper quadrant ultrasound but is not tender in the right upper quadrant on exam.  She is not febrile she does not have a significant increasing white count or signs of changes in  LFTs.  After GYN evaluation patient says she has to leave to catch her bus.  Patient appears nontoxic will follow-up with GYN as outpatient  Final Assessment: Severe lower abdominal pain radiating down legs, accompanied by leg and foot cramping. Postmenopausal with vaginal bleeding and positive hCG levels. Fluids and pain medication administered. OB/GYN consultation planned for further evaluation.  Clinical Impression: - Generalized abdominal pain Vaginal bleeding  Disposition: - Follow-Up: OB/GYN consultation for further evaluation  The following medications were ordered/given during the ED encounter.  Medications administered in the Emergency Department were monitored for toxicity and efficacy. Orders Placed This Encounter  Medications   DISCONTD: ondansetron  (ZOFRAN ) injection 4 mg    Standing order?:   Yes   NaCl 0.9% bolus 1,000 mL   ketoROLAC  tromethamine  (TORADOL ) 15 mg/mL injection 15 mg   dicyclomine (BENTYL) 20 mg tablet    Sig: Take one tablet (20 mg dose) by mouth 2 (two) times daily.    Dispense:  20 tablet    Refill:  0   ondansetron  (ZOFRAN -ODT) 4 mg disintegrating tablet    Sig: Take one tablet (4 mg dose) by mouth every 8 (eight) hours as needed for Nausea for up to 7 days.    Dispense:  12 tablet    Refill:  0      ED Disposition     ED Disposition  Discharge   Condition  Stable   Comment  --         FINAL IMPRESSION   Final diagnoses:  Abdominal pain, unspecified abdominal location     AI technology was used in creating this visit note. Verbal consent from the patient/caregiver was obtained prior to its use.          [  1] No current facility-administered medications for this encounter.   Current Outpatient Medications  Medication Sig Dispense Refill   dicyclomine (BENTYL) 20 mg tablet Take one tablet (20 mg dose) by mouth 2 (two) times daily. 20 tablet 0   ondansetron  (ZOFRAN -ODT) 4 mg disintegrating tablet Take one tablet (4 mg  dose) by mouth every 8 (eight) hours as needed for Nausea for up to 7 days. 12 tablet 0  [2] No Known Allergies [3] No family history on file. [4] Social History Socioeconomic History   Marital status: Single  Tobacco Use   Smoking status: Every Day    Current packs/day: 0.50    Types: Cigarettes   Smokeless tobacco: Never  Vaping Use   Vaping status: Never Used  Substance and Sexual Activity   Alcohol use: Not Currently   Drug use: Not Currently  Social History Narrative   ** Merged History Encounter **       Christopher S. Delia, MD 04/14/24 2041

## 2024-04-21 ENCOUNTER — Emergency Department (HOSPITAL_BASED_OUTPATIENT_CLINIC_OR_DEPARTMENT_OTHER)

## 2024-04-21 ENCOUNTER — Other Ambulatory Visit: Payer: Self-pay

## 2024-04-21 ENCOUNTER — Telehealth: Payer: Self-pay | Admitting: General Practice

## 2024-04-21 ENCOUNTER — Emergency Department (HOSPITAL_BASED_OUTPATIENT_CLINIC_OR_DEPARTMENT_OTHER)
Admission: EM | Admit: 2024-04-21 | Discharge: 2024-04-21 | Disposition: A | Discharge status: Home / Self Care | Attending: Emergency Medicine | Admitting: Emergency Medicine

## 2024-04-21 ENCOUNTER — Encounter (HOSPITAL_BASED_OUTPATIENT_CLINIC_OR_DEPARTMENT_OTHER): Payer: Self-pay | Admitting: Emergency Medicine

## 2024-04-21 DIAGNOSIS — R1030 Lower abdominal pain, unspecified: Secondary | ICD-10-CM | POA: Insufficient documentation

## 2024-04-21 LAB — COMPREHENSIVE METABOLIC PANEL WITH GFR
ALT: 30 U/L (ref 0–44)
AST: 30 U/L (ref 15–41)
Albumin: 4.2 g/dL (ref 3.5–5.0)
Alkaline Phosphatase: 125 U/L (ref 38–126)
Anion gap: 13 (ref 5–15)
BUN: 8 mg/dL (ref 6–20)
CO2: 23 mmol/L (ref 22–32)
Calcium: 9.9 mg/dL (ref 8.9–10.3)
Chloride: 108 mmol/L (ref 98–111)
Creatinine, Ser: 0.62 mg/dL (ref 0.44–1.00)
GFR, Estimated: >60 mL/min
Glucose, Bld: 125 mg/dL — ABNORMAL HIGH (ref 70–99)
Potassium: 3.6 mmol/L (ref 3.5–5.1)
Sodium: 143 mmol/L (ref 135–145)
Total Bilirubin: 0.4 mg/dL (ref 0.0–1.2)
Total Protein: 7.8 g/dL (ref 6.5–8.1)

## 2024-04-21 LAB — URINALYSIS, ROUTINE W REFLEX MICROSCOPIC
Bacteria, UA: NONE SEEN
Bilirubin Urine: NEGATIVE
Glucose, UA: NEGATIVE mg/dL
Hgb urine dipstick: NEGATIVE
Ketones, ur: NEGATIVE mg/dL
Leukocytes,Ua: NEGATIVE
Nitrite: NEGATIVE
Protein, ur: NEGATIVE mg/dL
Specific Gravity, Urine: 1.019 (ref 1.005–1.030)
pH: 7.5 (ref 5.0–8.0)

## 2024-04-21 LAB — CBC
HCT: 42.8 % (ref 36.0–46.0)
Hemoglobin: 14.7 g/dL (ref 12.0–15.0)
MCH: 30.5 pg (ref 26.0–34.0)
MCHC: 34.3 g/dL (ref 30.0–36.0)
MCV: 88.8 fL (ref 80.0–100.0)
Platelets: 271 10*3/uL (ref 150–400)
RBC: 4.82 MIL/uL (ref 3.87–5.11)
RDW: 13.3 % (ref 11.5–15.5)
WBC: 10.1 10*3/uL (ref 4.0–10.5)
nRBC: 0 % (ref 0.0–0.2)

## 2024-04-21 LAB — LIPASE, BLOOD: Lipase: 30 U/L (ref 11–51)

## 2024-04-21 MED ORDER — IOHEXOL 300 MG/ML  SOLN
100.0000 mL | Freq: Once | INTRAMUSCULAR | Status: AC | PRN
  Administered 2024-04-21: 100 mL via INTRAVENOUS

## 2024-04-21 MED ORDER — MORPHINE SULFATE (PF) 4 MG/ML IV SOLN
4.0000 mg | Freq: Once | INTRAVENOUS | Status: AC
  Administered 2024-04-21: 4 mg via INTRAVENOUS
  Filled 2024-04-21: qty 1

## 2024-04-21 NOTE — ED Notes (Signed)
 Pt states she has a ride home and that she is not driving herself.

## 2024-04-21 NOTE — Discharge Instructions (Addendum)
 Please use Tylenol  or ibuprofen  for pain.  You may use 600 mg ibuprofen  every 6 hours or 1000 mg of Tylenol  every 6 hours.  You may choose to alternate between the 2.  This would be most effective.  Not to exceed 4 g of Tylenol  within 24 hours.  Not to exceed 3200 mg ibuprofen  24 hours.  Your workup today did not show a clear cause for your abdominal pain.  Given that you had some abnormal uterine bleeding last week while in the emergency department I still think it is a good idea for you to follow-up with a gynecologist for possible endometrial biopsy and further evaluation.  Please return if you have worsening or significantly ongoing abdominal pain, nausea, vomiting.

## 2024-04-21 NOTE — ED Triage Notes (Signed)
 Pt c/o lower abd pain that returned yesterday, Recent tx last week for same

## 2024-04-22 NOTE — Telephone Encounter (Signed)
 I am unable to take on any additional patients at this time but it looks like she was seen in the Emergency Department last night and has follow up with OBGYN planned.

## 2024-04-22 NOTE — Telephone Encounter (Signed)
 Called pt and informed her of former PCP's message. Pt verbalized understanding. I offered pt to establish care with one of our other providers, with April being the first available, but pt declined stating she needed something sooner. Pt stated that in addition to the mass she informed me about, she is also out of her seroquel  and needs this refilled as soon as possible. I informed pt of [facility] on [address] and gave her the contact info. Additionally, I informed pt that I was able to find her a new patient appt available in [facility] with Dr. Jackalyn Blazing on 04/29/24, which is 10 minutes from patient's home. Pt verbalized understanding and took contact information for [facility]. Patient thanks us  for our efforts to help her.

## 2024-04-22 NOTE — Telephone Encounter (Signed)
See Dr Parkers note
